# Patient Record
Sex: Female | Born: 1935 | Race: White | Hispanic: No | Smoking: Former smoker
Health system: Southern US, Community
[De-identification: ages and names within clinical notes are randomized; demographics above are authoritative.]

## PROBLEM LIST (undated history)

## (undated) DIAGNOSIS — M1990 Unspecified osteoarthritis, unspecified site: Secondary | ICD-10-CM

## (undated) DIAGNOSIS — K219 Gastro-esophageal reflux disease without esophagitis: Secondary | ICD-10-CM

## (undated) DIAGNOSIS — N189 Chronic kidney disease, unspecified: Secondary | ICD-10-CM

## (undated) DIAGNOSIS — I10 Essential (primary) hypertension: Secondary | ICD-10-CM

## (undated) DIAGNOSIS — R7301 Impaired fasting glucose: Secondary | ICD-10-CM

## (undated) DIAGNOSIS — I4891 Unspecified atrial fibrillation: Secondary | ICD-10-CM

## (undated) DIAGNOSIS — K8020 Calculus of gallbladder without cholecystitis without obstruction: Secondary | ICD-10-CM

## (undated) DIAGNOSIS — G8929 Other chronic pain: Secondary | ICD-10-CM

## (undated) DIAGNOSIS — I6529 Occlusion and stenosis of unspecified carotid artery: Secondary | ICD-10-CM

## (undated) DIAGNOSIS — M545 Low back pain: Secondary | ICD-10-CM

## (undated) DIAGNOSIS — E785 Hyperlipidemia, unspecified: Secondary | ICD-10-CM

## (undated) DIAGNOSIS — K851 Biliary acute pancreatitis without necrosis or infection: Secondary | ICD-10-CM

## (undated) HISTORY — DX: Calculus of gallbladder without cholecystitis without obstruction: K80.20

## (undated) HISTORY — DX: Occlusion and stenosis of unspecified carotid artery: I65.29

## (undated) HISTORY — DX: Impaired fasting glucose: R73.01

## (undated) HISTORY — DX: Hyperlipidemia, unspecified: E78.5

## (undated) HISTORY — PX: APPENDECTOMY: SHX54

## (undated) HISTORY — DX: Chronic kidney disease, unspecified: N18.9

## (undated) HISTORY — DX: Essential (primary) hypertension: I10

## (undated) HISTORY — PX: CHOLECYSTECTOMY: SHX55

## (undated) HISTORY — DX: Other chronic pain: G89.29

## (undated) HISTORY — DX: Gastro-esophageal reflux disease without esophagitis: K21.9

## (undated) HISTORY — DX: Unspecified atrial fibrillation: I48.91

## (undated) HISTORY — PX: CATARACT EXTRACTION: SUR2

## (undated) HISTORY — DX: Unspecified osteoarthritis, unspecified site: M19.90

## (undated) HISTORY — DX: Biliary acute pancreatitis without necrosis or infection: K85.10

## (undated) HISTORY — DX: Low back pain: M54.5

---

## 2008-06-30 ENCOUNTER — Ambulatory Visit: Payer: Self-pay | Admitting: Cardiology

## 2008-06-30 ENCOUNTER — Encounter: Payer: Self-pay | Admitting: Internal Medicine

## 2008-06-30 LAB — CONVERTED CEMR LAB
Chloride: 93 meq/L — ABNORMAL LOW (ref 96–112)
Creatinine, Ser: 1.81 mg/dL — ABNORMAL HIGH (ref 0.40–1.20)
Potassium: 4.7 meq/L (ref 3.5–5.3)

## 2008-07-07 ENCOUNTER — Ambulatory Visit: Payer: Self-pay | Admitting: Cardiology

## 2008-07-07 ENCOUNTER — Encounter: Payer: Self-pay | Admitting: Internal Medicine

## 2008-07-07 LAB — CONVERTED CEMR LAB
CO2: 22 meq/L (ref 19–32)
Glucose, Bld: 117 mg/dL — ABNORMAL HIGH (ref 70–99)
Potassium: 4.4 meq/L (ref 3.5–5.3)
Sodium: 137 meq/L (ref 135–145)

## 2008-07-14 ENCOUNTER — Ambulatory Visit: Payer: Self-pay | Admitting: Cardiology

## 2008-07-21 ENCOUNTER — Ambulatory Visit: Payer: Self-pay | Admitting: Cardiology

## 2008-08-06 ENCOUNTER — Ambulatory Visit: Payer: Self-pay | Admitting: Cardiology

## 2008-08-20 ENCOUNTER — Ambulatory Visit: Payer: Self-pay | Admitting: Cardiology

## 2008-09-10 ENCOUNTER — Ambulatory Visit: Payer: Self-pay | Admitting: Cardiovascular Disease

## 2008-10-06 ENCOUNTER — Ambulatory Visit: Payer: Self-pay | Admitting: Cardiology

## 2008-10-22 ENCOUNTER — Ambulatory Visit: Payer: Self-pay | Admitting: Cardiovascular Disease

## 2008-11-19 ENCOUNTER — Ambulatory Visit: Payer: Self-pay | Admitting: Cardiology

## 2008-12-17 ENCOUNTER — Ambulatory Visit: Payer: Self-pay | Admitting: Cardiology

## 2009-01-14 ENCOUNTER — Ambulatory Visit: Payer: Self-pay | Admitting: Cardiology

## 2009-02-11 ENCOUNTER — Ambulatory Visit: Payer: Self-pay | Admitting: Cardiology

## 2009-03-09 ENCOUNTER — Ambulatory Visit: Payer: Self-pay

## 2009-03-09 ENCOUNTER — Ambulatory Visit: Payer: Self-pay | Admitting: Cardiology

## 2009-04-06 ENCOUNTER — Ambulatory Visit: Payer: Self-pay | Admitting: Cardiology

## 2009-05-06 ENCOUNTER — Ambulatory Visit: Payer: Self-pay | Admitting: Cardiovascular Disease

## 2009-05-18 ENCOUNTER — Telehealth: Payer: Self-pay | Admitting: Cardiology

## 2009-05-27 ENCOUNTER — Encounter: Payer: Self-pay | Admitting: Cardiology

## 2009-06-01 ENCOUNTER — Ambulatory Visit: Payer: Self-pay | Admitting: Cardiology

## 2009-06-28 ENCOUNTER — Encounter: Payer: Self-pay | Admitting: *Deleted

## 2009-06-29 ENCOUNTER — Ambulatory Visit: Payer: Self-pay | Admitting: Cardiology

## 2009-06-29 LAB — CONVERTED CEMR LAB: Prothrombin Time: 21.2 s

## 2009-07-27 ENCOUNTER — Ambulatory Visit: Payer: Self-pay | Admitting: Cardiology

## 2009-07-27 LAB — CONVERTED CEMR LAB: POC INR: 2.6

## 2009-08-26 ENCOUNTER — Ambulatory Visit: Payer: Self-pay | Admitting: Cardiology

## 2009-08-26 DIAGNOSIS — I10 Essential (primary) hypertension: Secondary | ICD-10-CM

## 2009-08-26 DIAGNOSIS — R0989 Other specified symptoms and signs involving the circulatory and respiratory systems: Secondary | ICD-10-CM

## 2009-08-26 DIAGNOSIS — I4891 Unspecified atrial fibrillation: Secondary | ICD-10-CM

## 2009-08-26 DIAGNOSIS — I739 Peripheral vascular disease, unspecified: Secondary | ICD-10-CM

## 2009-08-26 LAB — CONVERTED CEMR LAB: POC INR: 2.7

## 2009-09-02 ENCOUNTER — Encounter: Payer: Self-pay | Admitting: Cardiology

## 2009-09-13 ENCOUNTER — Telehealth: Payer: Self-pay | Admitting: Cardiology

## 2009-09-23 ENCOUNTER — Ambulatory Visit: Payer: Self-pay | Admitting: Cardiology

## 2009-09-23 LAB — CONVERTED CEMR LAB: POC INR: 2.8

## 2009-10-27 ENCOUNTER — Ambulatory Visit: Payer: Self-pay | Admitting: Cardiology

## 2009-10-27 DIAGNOSIS — E785 Hyperlipidemia, unspecified: Secondary | ICD-10-CM

## 2009-11-25 ENCOUNTER — Ambulatory Visit: Payer: Self-pay | Admitting: Internal Medicine

## 2009-11-25 LAB — CONVERTED CEMR LAB: POC INR: 3.6

## 2009-12-09 ENCOUNTER — Ambulatory Visit: Payer: Self-pay | Admitting: Cardiology

## 2009-12-15 ENCOUNTER — Encounter: Payer: Self-pay | Admitting: Cardiology

## 2010-01-06 ENCOUNTER — Telehealth: Payer: Self-pay | Admitting: Cardiology

## 2010-01-06 ENCOUNTER — Ambulatory Visit: Payer: Self-pay | Admitting: Cardiovascular Disease

## 2010-02-03 ENCOUNTER — Ambulatory Visit: Payer: Self-pay | Admitting: Cardiovascular Disease

## 2010-03-10 ENCOUNTER — Encounter: Payer: Self-pay | Admitting: Cardiology

## 2010-03-14 ENCOUNTER — Encounter: Payer: Self-pay | Admitting: Cardiology

## 2010-03-15 ENCOUNTER — Ambulatory Visit: Payer: Self-pay | Admitting: Cardiovascular Disease

## 2010-03-15 ENCOUNTER — Ambulatory Visit: Payer: Self-pay

## 2010-03-15 ENCOUNTER — Encounter: Payer: Self-pay | Admitting: Cardiology

## 2010-03-15 LAB — CONVERTED CEMR LAB: POC INR: 2.2

## 2010-04-12 ENCOUNTER — Ambulatory Visit: Payer: Self-pay | Admitting: Cardiovascular Disease

## 2010-05-10 ENCOUNTER — Encounter: Payer: Self-pay | Admitting: Cardiology

## 2010-05-10 ENCOUNTER — Ambulatory Visit: Payer: Self-pay | Admitting: Cardiology

## 2010-05-10 DIAGNOSIS — R0602 Shortness of breath: Secondary | ICD-10-CM

## 2010-05-10 LAB — CONVERTED CEMR LAB: POC INR: 2.1

## 2010-05-17 ENCOUNTER — Encounter: Payer: Self-pay | Admitting: Cardiology

## 2010-05-17 ENCOUNTER — Ambulatory Visit: Payer: Self-pay

## 2010-05-17 LAB — CONVERTED CEMR LAB
ALT: 15 units/L (ref 0–35)
AST: 16 units/L (ref 0–37)
Albumin: 4.5 g/dL (ref 3.5–5.2)
Alkaline Phosphatase: 49 units/L (ref 39–117)
Glucose, Bld: 139 mg/dL — ABNORMAL HIGH (ref 70–99)
Potassium: 4.3 meq/L (ref 3.5–5.3)
Sodium: 136 meq/L (ref 135–145)
Total Protein: 6.6 g/dL (ref 6.0–8.3)

## 2010-05-23 ENCOUNTER — Telehealth: Payer: Self-pay | Admitting: Cardiovascular Disease

## 2010-06-07 ENCOUNTER — Ambulatory Visit: Payer: Self-pay | Admitting: Cardiovascular Disease

## 2010-06-07 LAB — CONVERTED CEMR LAB: POC INR: 2.3

## 2010-07-06 ENCOUNTER — Ambulatory Visit: Payer: Self-pay | Admitting: Cardiovascular Disease

## 2010-08-03 ENCOUNTER — Ambulatory Visit: Payer: Self-pay | Admitting: Cardiovascular Disease

## 2010-08-31 ENCOUNTER — Encounter: Payer: Self-pay | Admitting: Internal Medicine

## 2010-08-31 ENCOUNTER — Telehealth: Payer: Self-pay | Admitting: Cardiology

## 2010-08-31 ENCOUNTER — Ambulatory Visit: Payer: Self-pay | Admitting: Internal Medicine

## 2010-08-31 LAB — CONVERTED CEMR LAB
POC INR: 2.7
POC INR: 2.7

## 2010-09-16 ENCOUNTER — Encounter: Payer: Self-pay | Admitting: Cardiology

## 2010-09-28 ENCOUNTER — Ambulatory Visit: Payer: Self-pay | Admitting: Cardiovascular Disease

## 2010-09-28 LAB — CONVERTED CEMR LAB: POC INR: 2.9

## 2010-10-04 ENCOUNTER — Telehealth: Payer: Self-pay | Admitting: Cardiology

## 2010-10-24 ENCOUNTER — Encounter: Payer: Self-pay | Admitting: Cardiology

## 2010-10-25 ENCOUNTER — Ambulatory Visit: Payer: Self-pay | Admitting: Cardiovascular Disease

## 2010-10-25 ENCOUNTER — Ambulatory Visit: Payer: Self-pay

## 2010-10-25 ENCOUNTER — Encounter: Payer: Self-pay | Admitting: Cardiology

## 2010-11-16 ENCOUNTER — Ambulatory Visit
Admission: RE | Admit: 2010-11-16 | Discharge: 2010-11-16 | Payer: Self-pay | Source: Home / Self Care | Attending: Cardiology | Admitting: Cardiology

## 2010-11-16 ENCOUNTER — Encounter: Payer: Self-pay | Admitting: Cardiology

## 2010-11-16 ENCOUNTER — Ambulatory Visit: Admission: RE | Admit: 2010-11-16 | Discharge: 2010-11-16 | Payer: Self-pay | Source: Home / Self Care

## 2010-11-16 LAB — CONVERTED CEMR LAB: POC INR: 2.3

## 2010-12-13 NOTE — Medication Information (Signed)
Summary: CCR/AMD  Anticoagulant Therapy  Managed by: Cloyde Reams, RN, BSN Referring MD: Marca Ancona PCP: Gelene Mink, MD Supervising MD: Mariah Milling Indication 1: atrial fib (ICD-427.31) Lab Used: [facility] Site: [place] INR POC 2.3 INR RANGE 2.0-3.0  Dietary changes: no     Bleeding/hemorrhagic complications: no     Any changes in medication regimen? no     Any missed doses?: no       Is patient compliant with meds? yes       Allergies: No Known Drug Allergies  Anticoagulation Management History:      The patient is taking warfarin and comes in today for a routine follow up visit.  Positive risk factors for bleeding include an age of 75 years or older.  The bleeding index is 'intermediate risk'.  Positive CHADS2 values include History of HTN and Age > 57 years old.  The start date was 07/01/2008.  Anticoagulation responsible provider: Gollan.  INR POC: 2.3.  Cuvette Lot#: [id number].  Exp: 06/2011.    Anticoagulation Management Assessment/Plan:      The patient's current anticoagulation dose is Warfarin sodium 5 mg tabs: Use as directed by Anticoagulation Clinic.  The target INR is 2 - 3.  The next INR is due 05/10/2010.  Anticoagulation instructions were given to patient.  Results were reviewed/authorized by Cloyde Reams, RN, BSN.  She was notified by Cloyde Reams RN.         Prior Anticoagulation Instructions: INR 2.2  Continue on same dosage 2.5mg  daily except 5mg  on Mondays and Fridays.  Recheck in 4 weeks.    Current Anticoagulation Instructions: INR 2.3  Continue on same dosage 2.5mg  daily except 5mg  on Mondays and Fridays.  Recheck in 4 weeks.

## 2010-12-13 NOTE — Medication Information (Signed)
Summary: rov/ewj  Anticoagulant Therapy  Managed by: Cloyde Reams, RN, BSN Referring MD: Marca Ancona PCP: Dr. Gerhard Munch MD: Mariah Milling Indication 1: atrial fib (ICD-427.31) Lab Used: [facility] Site: [place] INR POC 2.5 INR RANGE 2.0-3.0  Dietary changes: no    Health status changes: yes       Details: Had cateract surgery since last visit.    Bleeding/hemorrhagic complications: no    Recent/future hospitalizations: no    Any changes in medication regimen? no    Recent/future dental: no  Any missed doses?: no       Is patient compliant with meds? yes       Allergies: No Known Drug Allergies  Anticoagulation Management History:      The patient is taking warfarin and comes in today for a routine follow up visit.  Positive risk factors for bleeding include an age of 75 years or older.  The bleeding index is 'intermediate risk'.  Positive CHADS2 values include History of HTN and Age > 75 years old.  The start date was 07/01/2008.  Anticoagulation responsible provider: gollan.  INR POC: 2.5.  Cuvette Lot#: [id number].  Exp: 08/2011.    Anticoagulation Management Assessment/Plan:      The patient's current anticoagulation dose is Warfarin sodium 5 mg tabs: Use as directed by Anticoagulation Clinic.  The target INR is 2 - 3.  The next INR is due 08/31/2010.  Anticoagulation instructions were given to patient.  Results were reviewed/authorized by Cloyde Reams, RN, BSN.  She was notified by Cloyde Reams RN.         Prior Anticoagulation Instructions: INR 2.7  Continue on same dosage 1/2 tablet daily except 1 tablet on Mondays and Fridays.  Recheck in 4 weeks.    Current Anticoagulation Instructions: INR 2.5  Continue on same dosage 1/2 tablet daily except 1 tablet on Mondays and Fridays.  Recheck in 4 weeks.

## 2010-12-13 NOTE — Medication Information (Signed)
Summary: CCR/AMD   Anticoagulant Therapy  Managed by: Robyn Haber, RN, BSN Referring MD: Marca Ancona PCP: Gelene Mink, MD Supervising MD: Gala Romney MD, Reuel Boom Indication 1: atrial fib (ICD-427.31) Lab Used: [facility] Site: [place] INR POC 2.4 INR RANGE 2.0-3.0  Dietary changes: no    Health status changes: no    Bleeding/hemorrhagic complications: no    Recent/future hospitalizations: no    Any changes in medication regimen? no    Recent/future dental: no  Any missed doses?: no       Is patient compliant with meds? yes       Allergies: No Known Drug Allergies  Anticoagulation Management History:      The patient is taking warfarin and comes in today for a routine follow up visit.  Positive risk factors for bleeding include an age of 75 years or older.  The bleeding index is 'intermediate risk'.  Positive CHADS2 values include History of HTN and Age > 75 years old.  The start date was 07/01/2008.  Anticoagulation responsible provider: Cherylynn Liszewski MD, Reuel Boom.  INR POC: 2.4.    Anticoagulation Management Assessment/Plan:      The patient's current anticoagulation dose is Warfarin sodium 5 mg tabs: Use as directed by Anticoagulation Clinic.  The target INR is 2 - 3.  The next INR is due 01/06/2010.  Anticoagulation instructions were given to patient.  Results were reviewed/authorized by Robyn Haber, RN, BSN.  She was notified by Charlena Cross, RN, BSN.         Prior Anticoagulation Instructions: none today then resume coumadin 2.5 mg daily with 5 mg on Monday and Friday  Current Anticoagulation Instructions: The patient is to continue with the same dose of coumadin.  This dosage includes: coumadin 2.5 mg daily with 5 mg on monday and friday

## 2010-12-13 NOTE — Medication Information (Signed)
Summary: [name] [name]  Anticoagulant Therapy  Managed by: Bethena Midget, RN, BSN Referring MD: Marca Ancona PCP: Dr. Gerhard Munch MD: Mariah Milling  Indication 1: atrial fib (ICD-427.31) Lab Used: [facility] Site: [place] INR POC 2.9 INR RANGE 2.0-3.0  Dietary changes: no    Health status changes: yes       Details: Has had both eye cataract removal since last visit  Bleeding/hemorrhagic complications: no    Recent/future hospitalizations: no    Any changes in medication regimen? no    Recent/future dental: no  Any missed doses?: no       Is patient compliant with meds? yes       Allergies: No Known Drug Allergies  Anticoagulation Management History:      The patient is taking warfarin and comes in today for a routine follow up visit.  Positive risk factors for bleeding include an age of 75 years or older.  The bleeding index is 'intermediate risk'.  Positive CHADS2 values include History of HTN and Age > 4 years old.  The start date was 07/01/2008.  Anticoagulation responsible Nyair Depaulo: Gollan .  INR POC: 2.9.  Cuvette Lot#: [id number].  Exp: 10/2011.    Anticoagulation Management Assessment/Plan:      The patient's current anticoagulation dose is Warfarin sodium 5 mg tabs: Use as directed by Anticoagulation Clinic.  The target INR is 2 - 3.  The next INR is due 10/25/2010.  Anticoagulation instructions were given to patient.  Results were reviewed/authorized by Bethena Midget, RN, BSN.  She was notified by Bethena Midget, RN, BSN.         Prior Anticoagulation Instructions: INR 2.7  Continue on same dosage 1/2 tablet daily except 1 tablet on Mondays and Fridays.  Recheck in 4 weeks.    Current Anticoagulation Instructions: INR 2.9 Continue 2.5mg s daily except 5mg s on Mondays and Fridays. Recheck in 4 weeks.

## 2010-12-13 NOTE — Progress Notes (Signed)
Summary: RX   Phone Note Refill Request Call back at Home Phone [phone] Message from:  Patient on October 04, 2010 10:25 AM  Refills Requested: Medication #1:  AMLODIPINE BESYLATE 5 MG TABS Take one tablet by mouth daily. [facility] [address]  Initial call taken by: Harlon Flor,  October 04, 2010 10:25 AM  Follow-up for Phone Call        Rx faxed to pharmacy Follow-up by: Bishop Dublin, CMA,  October 04, 2010 11:29 AM    Prescriptions: AMLODIPINE BESYLATE 5 MG TABS (AMLODIPINE BESYLATE) Take one tablet by mouth daily  #30 x 4   Entered by:   Bishop Dublin, CMA   Authorized by:   Marca Ancona, MD   Signed by:   Bishop Dublin, CMA on 10/04/2010   Method used:   [facility]ically to        [facility] [phone]* (retail)       [address] [place], [place]  [address]       Ph: [phone] or [phone]       Fax: [phone]   RxID:   [id number]

## 2010-12-13 NOTE — Progress Notes (Signed)
Summary: Results   Phone Note Call from Patient Call back at Home Phone [phone]   Caller: Patient Call For: gollan Summary of Call: Patient called RN back to get results of echocardiogram. Initial call taken by: [place],  May 23, 2010 3:27 PM  Follow-up for Phone Call        pt aware of results. Follow-up by: Benedict Needy, RN,  May 23, 2010 4:25 PM

## 2010-12-13 NOTE — Progress Notes (Signed)
Summary: RX   Phone Note Refill Request Call back at Home Phone [phone] Message from:  Patient on August 31, 2010 11:23 AM  Refills Requested: Medication #1:  LOSARTAN POTASSIUM 100 MG TABS 1 by mouth daily  Medication #2:  CARVEDILOL [facility] [address]  Initial call taken by: Harlon Flor,  August 31, 2010 11:23 AM    Prescriptions: COREG 25 MG TABS (CARVEDILOL) 1 tab by mouth twice daily  #60 x 6   Entered by:   Bishop Dublin, CMA   Authorized by:   Marca Ancona, MD   Signed by:   Bishop Dublin, CMA on 08/31/2010   Method used:   Electronically to        [facility] [phone]* (retail)       [address] [place], [place]  [address]       Ph: [phone] or [phone]       Fax: [phone]   RxID:   [id number] LOSARTAN POTASSIUM 100 MG TABS (LOSARTAN POTASSIUM) 1 by mouth daily  #30 x 6   Entered by:   Bishop Dublin, CMA   Authorized by:   Marca Ancona, MD   Signed by:   Bishop Dublin, CMA on 08/31/2010   Method used:   [facility]ically to        [facility] [phone]* (retail)       [address] [place], [place]  [address]       Ph: [phone] or [phone]       Fax: [phone]   RxID:   [id number]

## 2010-12-13 NOTE — Medication Information (Signed)
Summary: CCR/AMD      Allergies Added: NKDA Anticoagulant Therapy  Managed by: Robyn Haber, RN, BSN Referring MD: Marca Ancona PCP: Gelene Mink, MD Supervising MD: Mariah Milling Indication 1: atrial fib (ICD-427.31) Lab Used: [facility] Site: [place] INR POC 2.8 INR RANGE 2.0-3.0  Dietary changes: no    Health status changes: no    Bleeding/hemorrhagic complications: no    Recent/future hospitalizations: no    Any changes in medication regimen? no    Recent/future dental: no  Any missed doses?: no       Is patient compliant with meds? yes       Current Medications (verified): 1)  Aspirin 81 Mg Tbec (Aspirin) .... Take One Tablet By Mouth Daily 2)  Warfarin Sodium 5 Mg Tabs (Warfarin Sodium) .... Use As Directed By Anticoagulation Clinic 3)  Clonazepam 0.5 Mg Tabs (Clonazepam) .Marland Kitchen.. 1 By Mouth Daily Prn 4)  Losartan Potassium 100 Mg Tabs (Losartan Potassium) .Marland Kitchen.. 1 By Mouth Daily 5)  Chlorthalidone 25 Mg Tabs (Chlorthalidone) .... Take One Tablet By Mouth Daily 6)  Pravastatin Sodium 40 Mg Tabs (Pravastatin Sodium) .... Take One Tablet By Mouth Daily At Bedtime 7)  Coq-10 150 Mg Caps (Coenzyme Q10) .... Take 1 By Mouth Once Daily 8)  Coreg 25 Mg Tabs (Carvedilol) .Marland Kitchen.. 1 Tab By Mouth Twice Daily  Allergies (verified): No Known Drug Allergies  Anticoagulation Management History:      The patient is taking warfarin and comes in today for a routine follow up visit.  Positive risk factors for bleeding include an age of 75 years or older.  The bleeding index is 'intermediate risk'.  Positive CHADS2 values include History of HTN and Age > 64 years old.  The start date was 07/01/2008.  Anticoagulation responsible provider: gollan.  INR POC: 2.8.    Anticoagulation Management Assessment/Plan:      The patient's current anticoagulation dose is Warfarin sodium 5 mg tabs: Use as directed by Anticoagulation Clinic.  The target INR is 2 -  3.  The next INR is due 03/03/2010.  Anticoagulation instructions were given to patient.  Results were reviewed/authorized by Robyn Haber, RN, BSN.  She was notified by Mercer Pod.         Prior Anticoagulation Instructions: The patient is to continue with the same dose of coumadin.  This dosage includes: coumadin 2.5 mg daily with 5 mg on Monday and Friday  Current Anticoagulation Instructions: The patient is to continue with the same dose of coumadin.  This dosage includes: 2.5mg  everyday except 5mg  on Mon and Fri.Marland Kitchen

## 2010-12-13 NOTE — Miscellaneous (Signed)
Summary: Orders Update  Clinical Lists Changes  Orders: Added new Test order of Carotid Duplex (Carotid Duplex) - Signed 

## 2010-12-13 NOTE — Medication Information (Signed)
Summary: CCR/NE  Anticoagulant Therapy  Managed by: Cloyde Reams, RN, BSN Referring MD: Marca Ancona PCP: Dr. Gerhard Munch MD: Mariah Milling Indication 1: atrial fib (ICD-427.31) Lab Used: [facility] Site: [place] INR POC 2.7 INR RANGE 2.0-3.0  Dietary changes: no    Health status changes: no    Bleeding/hemorrhagic complications: no    Recent/future hospitalizations: no    Any changes in medication regimen? no    Recent/future dental: no  Any missed doses?: no       Is patient compliant with meds? yes       Allergies: No Known Drug Allergies  Anticoagulation Management History:      The patient is taking warfarin and comes in today for a routine follow up visit.  Positive risk factors for bleeding include an age of 75 years or older.  The bleeding index is 'intermediate risk'.  Positive CHADS2 values include History of HTN and Age > 75 years old.  The start date was 07/01/2008.  Anticoagulation responsible provider: gollan.  INR POC: 2.7.  Cuvette Lot#: [id number].  Exp: 08/2011.    Anticoagulation Management Assessment/Plan:      The patient's current anticoagulation dose is Warfarin sodium 5 mg tabs: Use as directed by Anticoagulation Clinic.  The target INR is 2 - 3.  The next INR is due 08/03/2010.  Anticoagulation instructions were given to patient.  Results were reviewed/authorized by Cloyde Reams, RN, BSN.  She was notified by Cloyde Reams RN.         Prior Anticoagulation Instructions: INR 2.3  Continue taking 1/2 tab daily except for 1 tab on Monday and Friday. Recheck in 4 weeks.   Current Anticoagulation Instructions: INR 2.7  Continue on same dosage 1/2 tablet daily except 1 tablet on Mondays and Fridays.  Recheck in 4 weeks.

## 2010-12-13 NOTE — Medication Information (Signed)
Summary: CCR   Anticoagulant Therapy  Managed by: Robyn Haber, RN, BSN Referring MD: Marca Ancona PCP: Gelene Mink, MD Supervising MD: Mariah Milling Indication 1: atrial fib (ICD-427.31) Lab Used: [facility] Site: [place] INR POC 2.6 INR RANGE 2.0-3.0  Dietary changes: no    Health status changes: no    Bleeding/hemorrhagic complications: no    Recent/future hospitalizations: no    Any changes in medication regimen? no    Recent/future dental: no  Any missed doses?: no       Is patient compliant with meds? yes       Allergies: No Known Drug Allergies  Anticoagulation Management History:      The patient is taking warfarin and comes in today for a routine follow up visit.  Positive risk factors for bleeding include an age of 75 years or older.  The bleeding index is 'intermediate risk'.  Positive CHADS2 values include History of HTN and Age > 75 years old.  The start date was 07/01/2008.  Anticoagulation responsible provider: gollan.  INR POC: 2.6.    Anticoagulation Management Assessment/Plan:      The patient's current anticoagulation dose is Warfarin sodium 5 mg tabs: Use as directed by Anticoagulation Clinic.  The target INR is 2 - 3.  The next INR is due 02/03/2010.  Anticoagulation instructions were given to patient.  Results were reviewed/authorized by Robyn Haber, RN, BSN.  She was notified by Charlena Cross, RN, BSN.         Prior Anticoagulation Instructions: The patient is to continue with the same dose of coumadin.  This dosage includes: coumadin 2.5 mg daily with 5 mg on monday and friday  Current Anticoagulation Instructions: The patient is to continue with the same dose of coumadin.  This dosage includes: coumadin 2.5 mg daily with 5 mg on Monday and Friday

## 2010-12-13 NOTE — Progress Notes (Signed)
Summary: RX   Phone Note Refill Request Call back at Home Phone [phone] Message from:  SELF on January 06, 2010 10:40 AM  Refills Requested: Medication #1:  WARFARIN SODIUM 5 MG TABS Use as directed by Anticoagulation Clinic [facility] [address]  Initial call taken by: Harlon Flor,  January 06, 2010 10:40 AM    Prescriptions: WARFARIN SODIUM 5 MG TABS (WARFARIN SODIUM) Use as directed by Anticoagulation Clinic  #35 x 3   Entered by:   Mercer Pod   Authorized by:   Marca Ancona, MD   Signed by:   Mercer Pod on 01/06/2010   Method used:   [facility] to        [facility] [phone]* (retail)       [address] [place], [place]  [address]       Ph: [phone] or [phone]       Fax: [phone]   RxID:   [id number]

## 2010-12-13 NOTE — Medication Information (Signed)
Summary: ccr   Anticoagulant Therapy  Managed by: Cloyde Reams, RN, BSN Referring MD: Marca Ancona PCP: Dr. Gerhard Munch MD: Shirlee Latch MD, Freida Busman Indication 1: atrial fib (ICD-427.31) Lab Used: [facility] Site: [facility] INR POC 2.3 INR RANGE 2.0-3.0  Dietary changes: no     Bleeding/hemorrhagic complications: no     Any changes in medication regimen? no     Any missed doses?: no       Is patient compliant with meds? yes      3  Allergies: No Known Drug Allergies  Anticoagulation Management History:      The patient is taking warfarin and comes in today for a routine follow up visit.  Positive risk factors for bleeding include an age of 75 years or older.  The bleeding index is 'intermediate risk'.  Positive CHADS2 values include History of HTN and Age > 43 years old.  The start date was 07/01/2008.  Anticoagulation responsible provider: Shirlee Latch MD, [place].  INR POC: 2.3.  Cuvette Lot#: [id number].  Exp: 06/2011.    Anticoagulation Management Assessment/Plan:      The patient's current anticoagulation dose is Warfarin sodium 5 mg tabs: Use as directed by Anticoagulation Clinic.  The target INR is 2 - 3.  The next INR is due 07/06/2010.  Anticoagulation instructions were given to patient.  Results were reviewed/authorized by Cloyde Reams, RN, BSN.  She was notified by Benedict Needy, RN.         Prior Anticoagulation Instructions: INR 2.1  Continue on same dosage 2.5mg  daily except 5mg  on Mondays and Fridays.  Recheck in 4 weeks.    Current Anticoagulation Instructions: INR 2.3  Continue taking 1/2 tab daily except for 1 tab on Monday and Friday. Recheck in 4 weeks.

## 2010-12-13 NOTE — Medication Information (Signed)
Summary: CCR/AMD  Anticoagulant Therapy  Managed by: Cloyde Reams, RN, BSN Referring MD: Marca Ancona PCP: Gelene Mink, MD Supervising MD: Mariah Milling Indication 1: atrial fib (ICD-427.31) Lab Used: [facility] Site: [place] INR POC 2.2 INR RANGE 2.0-3.0    Bleeding/hemorrhagic complications: no     Any changes in medication regimen? no     Any missed doses?: no       Is patient compliant with meds? yes       Allergies: No Known Drug Allergies  Anticoagulation Management History:      The patient is taking warfarin and comes in today for a routine follow up visit.  Positive risk factors for bleeding include an age of 75 years or older.  The bleeding index is 'intermediate risk'.  Positive CHADS2 values include History of HTN and Age > 68 years old.  The start date was 07/01/2008.  Anticoagulation responsible provider: Koby Hartfield.  INR POC: 2.2.  Cuvette Lot#: [id number].  Exp: 03/2011.    Anticoagulation Management Assessment/Plan:      The patient's current anticoagulation dose is Warfarin sodium 5 mg tabs: Use as directed by Anticoagulation Clinic.  The target INR is 2 - 3.  The next INR is due 04/12/2010.  Anticoagulation instructions were given to patient.  Results were reviewed/authorized by Cloyde Reams, RN, BSN.  She was notified by Cloyde Reams RN.         Prior Anticoagulation Instructions: The patient is to continue with the same dose of coumadin.  This dosage includes: 2.5mg  everyday except 5mg  on Mon and Fri..  Current Anticoagulation Instructions: INR 2.2  Continue on same dosage 2.5mg  daily except 5mg  on Mondays and Fridays.  Recheck in 4 weeks.

## 2010-12-13 NOTE — Medication Information (Signed)
Summary: CCR/AMD   Anticoagulant Therapy  Managed by: Robyn Haber, RN, BSN Referring MD: Marca Ancona PCP: Gelene Mink, MD Supervising MD: Graciela Husbands MD, Viviann Spare Indication 1: atrial fib (ICD-427.31) Lab Used: [facility] Site: [place] INR POC 3.6 INR RANGE 2.0-3.0  Dietary changes: no    Health status changes: no    Bleeding/hemorrhagic complications: no    Recent/future hospitalizations: no    Any changes in medication regimen? no    Recent/future dental: no  Any missed doses?: no       Is patient compliant with meds? yes       Allergies: No Known Drug Allergies  Anticoagulation Management History:      The patient is taking warfarin and comes in today for a routine follow up visit.  Positive risk factors for bleeding include an age of 72 years or older.  The bleeding index is 'intermediate risk'.  Positive CHADS2 values include History of HTN and Age > 82 years old.  The start date was 07/01/2008.  Anticoagulation responsible provider: Graciela Husbands MD, Viviann Spare.  INR POC: 3.6.    Anticoagulation Management Assessment/Plan:      The patient's current anticoagulation dose is Warfarin sodium 5 mg tabs: Use as directed by Anticoagulation Clinic.  The target INR is 2 - 3.  The next INR is due 12/09/2009.  Anticoagulation instructions were given to patient.  Results were reviewed/authorized by Robyn Haber, RN, BSN.  She was notified by Charlena Cross, RN, BSN.         Prior Anticoagulation Instructions: The patient is to continue with the same dose of coumadin.  This dosage includes: coumadin 2.5 mg daily with 5 mg on Mon and Fri  Current Anticoagulation Instructions: none today then resume coumadin 2.5 mg daily with 5 mg on Monday and Friday

## 2010-12-13 NOTE — Medication Information (Signed)
Summary: [facility]  Anticoagulant Therapy  Managed by: Cloyde Reams, RN, BSN Referring MD: Marca Ancona PCP: Gelene Mink, MD Supervising MD: Shirlee Latch MD, Kayode Petion Indication 1: atrial fib (ICD-427.31) Lab Used: [facility]--[place]  Site: [place] INR POC 2.1 INR RANGE 2.0-3.0    Bleeding/hemorrhagic complications: no     Any changes in medication regimen? no     Any missed doses?: no       Is patient compliant with meds? yes       Allergies: No Known Drug Allergies  Anticoagulation Management History:      The patient is taking warfarin and comes in today for a routine follow up visit.  Positive risk factors for bleeding include an age of 75 years or older.  The bleeding index is 'intermediate risk'.  Positive CHADS2 values include History of HTN and Age > 75 years old.  The start date was 07/01/2008.  Anticoagulation responsible provider: Shirlee Latch MD, Melany Wiesman.  INR POC: 2.1.  Cuvette Lot#: [id number].  Exp: 07/2011.    Anticoagulation Management Assessment/Plan:      The patient's current anticoagulation dose is Warfarin sodium 5 mg tabs: Use as directed by Anticoagulation Clinic.  The target INR is 2 - 3.  The next INR is due 06/07/2010.  Anticoagulation instructions were given to patient.  Results were reviewed/authorized by Cloyde Reams, RN, BSN.  She was notified by Cloyde Reams RN.         Prior Anticoagulation Instructions: INR 2.3  Continue on same dosage 2.5mg  daily except 5mg  on Mondays and Fridays.  Recheck in 4 weeks.    Current Anticoagulation Instructions: INR 2.1  Continue on same dosage 2.5mg  daily except 5mg  on Mondays and Fridays.  Recheck in 4 weeks.

## 2010-12-13 NOTE — Medication Information (Signed)
Summary: rov/ewj  Anticoagulant Therapy  Managed by: Cloyde Reams, RN, BSN Referring MD: Marca Ancona PCP: Dr. Gerhard Munch MD: Gala Romney MD, Reuel Boom Indication 1: atrial fib (ICD-427.31) Lab Used: [facility] Site: [place] INR POC 2.7 INR RANGE 2.0-3.0  Dietary changes: no    Health status changes: no    Bleeding/hemorrhagic complications: no    Recent/future hospitalizations: no    Any changes in medication regimen? no    Recent/future dental: no  Any missed doses?: no       Is patient compliant with meds? yes       Allergies: No Known Drug Allergies  Anticoagulation Management History:      The patient is taking warfarin and comes in today for a routine follow up visit.  Positive risk factors for bleeding include an age of 93 years or older.  The bleeding index is 'intermediate risk'.  Positive CHADS2 values include History of HTN and Age > 56 years old.  The start date was 07/01/2008.  Anticoagulation responsible Eupha Lobb: Bensimhon MD, Reuel Boom.  INR POC: 2.7.  Cuvette Lot#: [id number].  Exp: 09/2011.    Anticoagulation Management Assessment/Plan:      The patient's current anticoagulation dose is Warfarin sodium 5 mg tabs: Use as directed by Anticoagulation Clinic.  The target INR is 2 - 3.  The next INR is due 09/28/2010.  Anticoagulation instructions were given to patient.  Results were reviewed/authorized by Cloyde Reams, RN, BSN.  She was notified by Cloyde Reams RN.         Prior Anticoagulation Instructions: INR 2.5  Continue on same dosage 1/2 tablet daily except 1 tablet on Mondays and Fridays.  Recheck in 4 weeks.   Current Anticoagulation Instructions: INR 2.7  Continue on same dosage 1/2 tablet daily except 1 tablet on Mondays and Fridays.  Recheck in 4 weeks.

## 2010-12-13 NOTE — Medication Information (Signed)
Summary: [facility] Clinic  Anticoagulant Therapy  Managed by: Cloyde Reams, RN, BSN Referring MD: Marca Ancona PCP: Dr. Gerhard Munch MD: Gala Romney MD, Reuel Boom Indication 1: atrial fib (ICD-427.31) Lab Used: [facility] Site: [place] INR POC 2.7 INR RANGE 2.0-3.0           Allergies: No Known Drug Allergies  Anticoagulation Management History:      The patient is taking warfarin and comes in today for a routine follow up visit.  Positive risk factors for bleeding include an age of 31 years or older.  The bleeding index is 'intermediate risk'.  Positive CHADS2 values include History of HTN and Age > 60 years old.  The start date was 07/01/2008.  Anticoagulation responsible Jordin Vicencio: Bensimhon MD, Reuel Boom.  INR POC: 2.7.  Exp: 08/2011.    Anticoagulation Management Assessment/Plan:      The patient's current anticoagulation dose is Warfarin sodium 5 mg tabs: Use as directed by Anticoagulation Clinic.  The target INR is 2 - 3.  The next INR is due 09/28/2010.  Anticoagulation instructions were given to patient.  Results were reviewed/authorized by Cloyde Reams, RN, BSN.  She was notified by Cloyde Reams RN.         Prior Anticoagulation Instructions: INR 2.5  Continue on same dosage 1/2 tablet daily except 1 tablet on Mondays and Fridays.  Recheck in 4 weeks.   Current Anticoagulation Instructions: INR 2.7  Continue on same dosage 1/2 tablet daily except 1 tablet on Mondays and Fridays.  Recheck in 4 weeks.

## 2010-12-13 NOTE — Assessment & Plan Note (Signed)
Summary: F6M/CCR/AMD  Medications Added AMLODIPINE BESYLATE 5 MG TABS (AMLODIPINE BESYLATE) Take one tablet by mouth daily      Allergies Added: NKDA  Visit Type:  Follow-up Primary Provider:  Dr. Lacie Scotts  CC:  No cardiac complaints..  History of Present Illness: 77 yo with history of atrial fibrillation, HTN, carotid stenosis, and smoking presents for followup.  Her BP continues to run quite high despite taking all her meds.  It is 165/76 here and systolic BP has been in the 140s for the most part at home.   She is still in atrial fibrillation with good heart rate control.  She is taking coumadin.  She does continue to smoke but is down to a pack a week.  She has no exertional chest pain or stroke-like symptoms.  She is still playing a lot of golf. She is mildly short of breath walking up a hill but has no trouble on flat ground or with steps.  Carotid dopplers in 5/11 showed moderate bilateral stenosis.  She is tolerating pravastatin without any muscle pain.   Labs (7/10): TSH normal, creatinine 1.47, LDL 76, HDL 52 Labs (10/10): K 4.5, creatinine 1.18, LDL 90, HDL 51 Labs (5/11): LDL 80, HDL 57, NT-proBNP 1610  Current Medications (verified): 1)  Aspirin 81 Mg Tbec (Aspirin) .... Take One Tablet By Mouth Daily 2)  Warfarin Sodium 5 Mg Tabs (Warfarin Sodium) .... Use As Directed By Anticoagulation Clinic 3)  Losartan Potassium 100 Mg Tabs (Losartan Potassium) .Marland Kitchen.. 1 By Mouth Daily 4)  Chlorthalidone 25 Mg Tabs (Chlorthalidone) .... Take One Tablet By Mouth Daily 5)  Pravastatin Sodium 40 Mg Tabs (Pravastatin Sodium) .... Take One Tablet By Mouth Daily At Bedtime 6)  Coreg 25 Mg Tabs (Carvedilol) .Marland Kitchen.. 1 Tab By Mouth Twice Daily  Allergies (verified): No Known Drug Allergies  Past History:  Family History: Last updated: 08/26/2009 The patient has had several sisters and brothers with atrial fibrillation.  She also has a niece who has atrial fibrillation.  Her father had  myocardial infarction at age of 21 and her sister had congestive heart failure in the 24s.  She has also had several sisters who had strokes.   Social History: Last updated: 08/26/2009  The patient lives in [place] with her husband.  She is retired.  She plays a lot of golf.  She helps take care of several grandchildren.  She smokes half-a-pack a day and has done so for years. She drinks minimal alcohol and uses no illicit drugs.      Past Medical History: 1. Osteoarthritis. 2. Hypertension. 3. Smoking.  4. Gastroesophageal reflux disease. 5. Hyperlipidemia.  Myalgias with Lipitor, Crestor, Zocor and did not tolerate Niaspan.  She does tolerate pravastatin.  6. Cholelithiasis. 7. Chronic kidney disease.  8. Impaired fasting glucose. 9. Atrial fibrillation.  This seems to have become persistent.  HR is controlled.  10. Carotid stenosis. Carotid US (5/11): 60-79% bilateral ICA stenosis.   She does have bilateral carotid bruits. 11. Echocardiogram.  This is done in August 2009, showed LVH with normal LV function, EF 60%.  Sclerotic aortic valve without significant stenosis and mild mitral annular calcification.  There is mitral regurgitation present with a dilated left atrium.  I do not see in the report the degree of LVH or the degree of mitral regurgitation.   Family History: Reviewed history from 08/26/2009 and no changes required. The patient has had several sisters and brothers with atrial fibrillation.  She also has a niece  who has atrial fibrillation.  Her father had myocardial infarction at age of 42 and her sister had congestive heart failure in the 49s.  She has also had several sisters who had strokes.   Social History: Reviewed history from 08/26/2009 and no changes required.  The patient lives in [place] with her husband.  She is retired.  She plays a lot of golf.  She helps take care of several grandchildren.  She smokes half-a-pack a day and has done so for years. She  drinks minimal alcohol and uses no illicit drugs.      Review of Systems       All systems reviewed and negative except as per HPI.   Vital Signs:  Patient profile:   77 year old female Height:      67 inches Weight:      160 pounds Pulse rate:   88 / minute BP sitting:   165 / 76  (left arm) Cuff size:   regular  Vitals Entered By: Bishop Dublin, CMA (May 10, 2010 11:35 AM)  Physical Exam  General:  Well developed, well nourished, in no acute distress. Neck:  Neck supple, no JVD. No masses, thyromegaly or abnormal cervical nodes. Lungs:  Clear bilaterally to auscultation and percussion. Heart:  Non-displaced PMI, chest non-tender; irregular rate and rhythm, S1, S2 with 2/6 early systolic murmur at RUSB.  Bilateral carotid bruits. 1+ PT pulse on the right, trace PT pulse on the left.  No edema, no varicosities. Abdomen:  Bowel sounds positive; abdomen soft and non-tender without masses, organomegaly, or hernias noted. No hepatosplenomegaly. Extremities:  No clubbing or cyanosis. Neurologic:  Alert and oriented x 3. Psych:  Normal affect.   Impression & Recommendations:  Problem # 1:  ATRIAL FIBRILLATION (ICD-427.31) Atrial fibrillation seems to have become persistent.  Rate is well-controlled and she is on coumadin.  As she does not seem to be symptomatic, I think that continuing a rate control strategy would be reasonable.   Problem # 2:  HYPERLIPIDEMIA-MIXED (ICD-272.4) Patient has been watching her diet more closely.  Will await next lipid profile and if LDL is > 70, would plan on increasing pravastatin to 80 mg daily.  Goal LDL < 70 because of known vascular disease (carotid stenosis).   Problem # 3:  CAROTID BRUIT (ICD-785.9) Moderate bilateral carotid disease.  Followup carotid dopplers in 11/11.   Problem # 4:  HYPERTENSION, UNSPECIFIED (ICD-401.9) Assessment: Unchanged BP is still too high, start amlodipine 5 mg daily.  BP check in 2 wks.   Problem # 5:   SHORTNESS OF BREATH (ICD-786.05) Patient has good exercise tolerance in general but gets some mild dyspnea when walking up a hill.  She looks euvolemic on exam.  Interestingly, she had an NT-proBNP at her PCP's office recently that was elevated to 1733.  I will have her get an echocardiogram to evaluate LV function.  I will get a BNP today.   Other Orders: Echocardiogram (Echo) Carotid Duplex (Carotid Duplex) T-BNP  (B Natriuretic Peptide) ([id number]) T-Comprehensive Metabolic Panel ([id number])  Patient Instructions: 1)  Your physician recommends that you have lab work today (CMP, BNP) 2)  Your physician has recommended you make the following change in your medication: START AMOLODIPINE 5MG  DAILY 3)  Your physician wants you to follow-up in: 6 MONTHS   You will receive a reminder letter in the mail two months in advance. If you don't receive a letter, please call our office to schedule the follow-up appointment. 4)  Your physician has requested that you have a carotid duplex. This test is an ultrasound of the carotid arteries in your neck. It looks at blood flow through these arteries that supply the brain with blood. Allow one hour for this exam. There are no restrictions or special instructions. NOVEMBER 2011 5)  Your physician has requested that you have an echocardiogram.  Echocardiography is a painless test that uses sound waves to create images of your heart. It provides your doctor with information about the size and shape of your heart and how well your heart's chambers and valves are working.  This procedure takes approximately one hour. There are no restrictions for this procedure. ASAP 6)  Your physician has requested that you regularly monitor and record your blood pressure readings at home.  Please use the same machine at the same time of day to check your readings and call us with the results in 2 weeks.  Prescriptions: AMLODIPINE BESYLATE 5 MG TABS (AMLODIPINE BESYLATE) Take one  tablet by mouth daily  #30 x 4   Entered by:   Benedict Needy, RN   Authorized by:   Marca Ancona, MD   Signed by:   Benedict Needy, RN on 05/10/2010   Method used:   Electronically to        [facility] [facility] [employer] [phone]* (retail)       [address] [place], [place]  [address]       Ph: [phone] or [phone]       Fax: [phone]   RxID:   [id number]

## 2010-12-15 NOTE — Assessment & Plan Note (Signed)
Summary: F/U 6 MONTHS      Allergies Added: NKDA  Visit Type:  Follow-up Primary Provider:  Dr. Lacie Scotts  CC:  [name] Carpen. has irreg. heart beats.  Denies chest pain or shortness of breath..  History of Present Illness: 77 yo with history of atrial fibrillation, HTN, carotid stenosis, and smoking presents for followup.  BP looks ok today and has been reasonably well-controlled at recent checks at other doctors' offices.  Weight is up 8 lbs since last appointment (she says she may have overindulged over the holidays).  She continues to be in atrial fibrillation with good rate control.  She is tolerating pravastatin with very mild myalgias but does not appear to have increased it to 80 mg daily as I had asked her at last appointment.  She denies exertional shortness of breath or chest pain.  Still playing golf though not as often recently due to low back pain.  She does not notice being in atrial fibrillation.  Echo in 7/11 showed preserved EF with mild mitral regurgitaiton and mild pulmonary hypertension (PA systolic pressure 42 mmHg).  Carotids repeated in 12/11 were stable.    Labs (7/10): TSH normal, creatinine 1.47, LDL 76, HDL 52 Labs (10/10): K 4.5, creatinine 1.18, LDL 90, HDL 51 Labs (5/11): LDL 80, HDL 57, NT-proBNP 4132 Labs (6/11): K 4.3, creatinine 1.44, BNP 242  ECG: atrial fibrillation at 83, otherwise normal  Current Medications (verified): 1)  Aspirin 81 Mg Tbec (Aspirin) .... Take One Tablet By Mouth Daily 2)  Warfarin Sodium 5 Mg Tabs (Warfarin Sodium) .... Use As Directed By Anticoagulation Clinic 3)  Losartan Potassium 100 Mg Tabs (Losartan Potassium) .Marland Kitchen.. 1 By Mouth Daily 4)  Chlorthalidone 25 Mg Tabs (Chlorthalidone) .... Take One Tablet By Mouth Daily 5)  Pravastatin Sodium 40 Mg Tabs (Pravastatin Sodium) .... Take One Tablet By Mouth Daily At Bedtime 6)  Coreg 25 Mg Tabs (Carvedilol) .Marland Kitchen.. 1 Tab By Mouth Twice Daily 7)  Amlodipine Besylate 5 Mg Tabs (Amlodipine Besylate)  .... Take One Tablet By Mouth Daily  Allergies (verified): No Known Drug Allergies  Past History:  Family History: Last updated: 08/26/2009 The patient has had several sisters and brothers with atrial fibrillation.  She also has a niece who has atrial fibrillation.  Her father had myocardial infarction at age of 29 and her sister had congestive heart failure in the 54s.  She has also had several sisters who had strokes.   Social History: Last updated: 08/26/2009  The patient lives in [place] with her husband.  She is retired.  She plays a lot of golf.  She helps take care of several grandchildren.  She smokes half-a-pack a day and has done so for years. She drinks minimal alcohol and uses no illicit drugs.      Past Medical History: 1. Osteoarthritis. 2. Hypertension. 3. Smoking.  4. Gastroesophageal reflux disease. 5. Hyperlipidemia.  Myalgias with Lipitor, Crestor, Zocor and did not tolerate Niaspan.  She does tolerate pravastatin.  6. Cholelithiasis. 7. Chronic kidney disease.  8. Impaired fasting glucose. 9. Atrial fibrillation.  This seems to have become persistent.  HR is controlled. Echo (7/11): EF 55-65%, no regional wall motion abnormalities, mild MR, normal RV size and systolic function, PA systolic pressure 42 mmHg.  10. Carotid stenosis. Carotid US (5/11): 60-79% bilateral ICA stenosis.   Carotid US (12/11) with stable 60-79% bilateral carotid stenosis and left subclavian stenosis without steal.  She does have bilateral carotid bruits. 11. Chronic low back pain.  Family History: Reviewed history from 08/26/2009 and no changes required. The patient has had several sisters and brothers with atrial fibrillation.  She also has a niece who has atrial fibrillation.  Her father had myocardial infarction at age of 44 and her sister had congestive heart failure in the 71s.  She has also had several sisters who had strokes.   Social History: Reviewed history from 08/26/2009  and no changes required.  The patient lives in [place] with her husband.  She is retired.  She plays a lot of golf.  She helps take care of several grandchildren.  She smokes half-a-pack a day and has done so for years. She drinks minimal alcohol and uses no illicit drugs.      Review of Systems       All systems reviewed and negative except as per HPI.   Vital Signs:  Patient profile:   77 year old female Height:      67 inches Weight:      168 pounds BMI:     26.41 Pulse rate:   83 / minute BP sitting:   138 / 78  (left arm) Cuff size:   regular  Vitals Entered By: Bishop Dublin, CMA (November 16, 2010 9:51 AM)  Physical Exam  General:  Well developed, well nourished, in no acute distress. Neck:  Neck supple, no JVD. No masses, thyromegaly or abnormal cervical nodes. Lungs:  Clear bilaterally to auscultation and percussion. Heart:  Non-displaced PMI, chest non-tender; irregular rate and rhythm, S1, S2 with 2/6 early systolic murmur at RUSB.  Bilateral carotid bruits. 1+ PT pulse on the right, trace PT pulse on the left.  No edema, no varicosities. Abdomen:  Bowel sounds positive; abdomen soft and non-tender without masses, organomegaly, or hernias noted. No hepatosplenomegaly. Extremities:  No clubbing or cyanosis. Neurologic:  Alert and oriented x 3. Psych:  Normal affect.   Impression & Recommendations:  Problem # 1:  ATRIAL FIBRILLATION (ICD-427.31) This appears to be persistent.  She denies any significant exertional symptoms.  Rate is controlled on current dose of Coreg and she is taking coumadin.  I will continue her on these meds for now.  Given minimal symptoms, I do not suggest that we try to cardiovert her at this point.   Problem # 2:  HYPERLIPIDEMIA-MIXED (ICD-272.4) Goal LDL < 70 given vascular disease.  She is tolerating pravastatin.  Coenzyme Q10 helps but she does not always take it.  Will get most recent lipids from Dr. Lacie Scotts and encourage her to take the  coenzyme Q10.   Problem # 3:  PVD (ICD-443.9) Patient's history of foot pain, decreased pedal pulses, and smoking as well as known carotid vascular disease worry me that she has PAD in her legs.  I set her up for arterial dopplers but she did not get them done.  I will continue to aggressively treat risk factors.  She has no foot lesions.    Problem # 4:  CAROTID BRUIT (ICD-785.9) Moderate bilateral carotid disease.  Followup carotid dopplers in 6/12.   Problem # 5:  ESSENTIAL HYPERTENSION, BENIGN (ICD-401.1) BP under good control. Continue meds.   Problem # 6:  SMOKING I again advised her to quit.   Problem # 7:  DIASTOLIC CHF Based on mildly elevated BNP, patient appears to have a degree of diastolic CHF in the setting of atrial fibrillation and HTN.  She is minimally symptomatic and BP is controlled.    Other Orders: EKG w/ Interpretation (  93000) Carotid Duplex (Carotid Duplex)  Patient Instructions: 1)  Your physician recommends that you schedule a follow-up appointment in: 6 months. 2)  Your physician has requested that you have a carotid duplex. This test is an ultrasound of the carotid arteries in your neck. It looks at blood flow through these arteries that supply the brain with blood. Allow one hour for this exam. There are no restrictions or special instructions. June 2012

## 2010-12-15 NOTE — Medication Information (Signed)
Summary: Coumadin Clinic   Anticoagulant Therapy  Managed by: Cloyde Reams, RN, BSN Referring MD: Marca Ancona PCP: Dr. Gerhard Munch MD: Mariah Milling  Indication 1: atrial fib (ICD-427.31) Lab Used: [facility] Site: [place] INR POC 2.6 INR RANGE 2.0-3.0  Dietary changes: no    Health status changes: no    Bleeding/hemorrhagic complications: no    Recent/future hospitalizations: no    Any changes in medication regimen? no    Recent/future dental: no  Any missed doses?: no       Is patient compliant with meds? yes       Allergies: No Known Drug Allergies  Anticoagulation Management History:      The patient is taking warfarin and comes in today for a routine follow up visit.  Positive risk factors for bleeding include an age of 75 years or older.  The bleeding index is 'intermediate risk'.  Positive CHADS2 values include History of HTN and Age > 2 years old.  The start date was 07/01/2008.  Anticoagulation responsible provider: [place] .  INR POC: 2.6.  Exp: 10/2011.    Anticoagulation Management Assessment/Plan:      The patient's current anticoagulation dose is Warfarin sodium 5 mg tabs: Use as directed by Anticoagulation Clinic.  The target INR is 2 - 3.  The next INR is due 11/16/2010.  Anticoagulation instructions were given to patient.  Results were reviewed/authorized by Cloyde Reams, RN, BSN.  She was notified by Lanny Hurst RN.         Prior Anticoagulation Instructions: INR 2.9 Continue 2.5mg s daily except 5mg s on Mondays and Fridays. Recheck in 4 weeks.   Current Anticoagulation Instructions: INR 2.6  Continue on same dosage 1/2 tablet daily except 1 tablet on Mondays and Fridays.  Recheck in 4 weeks.

## 2010-12-15 NOTE — Miscellaneous (Signed)
Summary: Orders Update  Clinical Lists Changes  Orders: Added new Test order of Carotid Duplex (Carotid Duplex) - Signed 

## 2010-12-15 NOTE — Medication Information (Signed)
Summary: ccr/ewj  Anticoagulant Therapy  Managed by: Cloyde Reams, RN, BSN Referring MD: Marca Ancona PCP: Dr. Gerhard Munch MD: Shirlee Latch MD, Freida Busman Indication 1: atrial fib (ICD-427.31) Lab Used: [facility] Site: [place] INR POC 2.3 INR RANGE 2.0-3.0  Dietary changes: no    Health status changes: no    Bleeding/hemorrhagic complications: no    Recent/future hospitalizations: no    Any changes in medication regimen? no    Recent/future dental: no  Any missed doses?: no       Is patient compliant with meds? yes       Allergies: No Known Drug Allergies  Anticoagulation Management History:      The patient is taking warfarin and comes in today for a routine follow up visit.  Positive risk factors for bleeding include an age of 75 years or older.  The bleeding index is 'intermediate risk'.  Positive CHADS2 values include History of HTN and Age > 75 years old.  The start date was 07/01/2008.  Anticoagulation responsible provider: Shirlee Latch MD, Sigmond Patalano.  INR POC: 2.3.  Cuvette Lot#: [id number].  Exp: 12/2011.    Anticoagulation Management Assessment/Plan:      The patient's current anticoagulation dose is Warfarin sodium 5 mg tabs: Use as directed by Anticoagulation Clinic.  The target INR is 2 - 3.  The next INR is due 12/21/2010.  Anticoagulation instructions were given to patient.  Results were reviewed/authorized by Cloyde Reams, RN, BSN.  She was notified by Cloyde Reams RN.         Prior Anticoagulation Instructions: INR 2.6  Continue on same dosage 1/2 tablet daily except 1 tablet on Mondays and Fridays.  Recheck in 4 weeks.    Current Anticoagulation Instructions: INR 2.3  Continue on same dosage 1/2 tablet daily except 1 tablet on Mondays and Fridays.  Recheck in 4 weeks.

## 2010-12-16 ENCOUNTER — Encounter: Payer: Self-pay | Admitting: Cardiology

## 2010-12-21 ENCOUNTER — Encounter (INDEPENDENT_AMBULATORY_CARE_PROVIDER_SITE_OTHER): Payer: MEDICARE

## 2010-12-21 ENCOUNTER — Encounter: Payer: Self-pay | Admitting: Cardiovascular Disease

## 2010-12-21 DIAGNOSIS — Z7901 Long term (current) use of anticoagulants: Secondary | ICD-10-CM

## 2010-12-21 DIAGNOSIS — I4891 Unspecified atrial fibrillation: Secondary | ICD-10-CM

## 2010-12-29 NOTE — Medication Information (Signed)
Summary: [facility]  Anticoagulant Therapy  Managed by: Cloyde Reams, RN, BSN Referring MD: Marca Ancona PCP: Dr. Gerhard Munch MD: Mariah Milling Indication 1: atrial fib (ICD-427.31) Lab Used: [facility] Site: [place] INR POC 3.0 INR RANGE 2.0-3.0  Dietary changes: yes       Details: Eating less vit K vegetables  Health status changes: no    Bleeding/hemorrhagic complications: no    Recent/future hospitalizations: no    Any changes in medication regimen? no    Recent/future dental: no  Any missed doses?: no       Is patient compliant with meds? yes       Allergies: No Known Drug Allergies  Anticoagulation Management History:      The patient is taking warfarin and comes in today for a routine follow up visit.  Positive risk factors for bleeding include an age of 75 years or older.  The bleeding index is 'intermediate risk'.  Positive CHADS2 values include History of HTN and Age > 75 years old.  The start date was 07/01/2008.  Anticoagulation responsible provider: Reis Goga.  INR POC: 3.0.  Cuvette Lot#: [id number].  Exp: 12/2011.    Anticoagulation Management Assessment/Plan:      The patient's current anticoagulation dose is Warfarin sodium 5 mg tabs: Use as directed by Anticoagulation Clinic.  The target INR is 2 - 3.  The next INR is due 01/18/2011.  Anticoagulation instructions were given to patient.  Results were reviewed/authorized by Cloyde Reams, RN, BSN.  She was notified by Cloyde Reams RN.         Prior Anticoagulation Instructions: INR 2.3  Continue on same dosage 1/2 tablet daily except 1 tablet on Mondays and Fridays.  Recheck in 4 weeks.    Current Anticoagulation Instructions: INR 3.0  Continue on same dosage 1/2 tablet daily except 1 tablet on Mondays and Fridays.  Recheck in 4 weeks.

## 2011-01-18 ENCOUNTER — Encounter (INDEPENDENT_AMBULATORY_CARE_PROVIDER_SITE_OTHER): Payer: Medicare Other

## 2011-01-18 ENCOUNTER — Encounter: Payer: Self-pay | Admitting: Internal Medicine

## 2011-01-18 DIAGNOSIS — I4891 Unspecified atrial fibrillation: Secondary | ICD-10-CM

## 2011-01-18 DIAGNOSIS — Z7901 Long term (current) use of anticoagulants: Secondary | ICD-10-CM

## 2011-01-24 NOTE — Medication Information (Signed)
Summary: rov/ewj  Anticoagulant Therapy  Managed by: Bethena Midget, RN, BSN Referring MD: Marca Ancona PCP: Dr. Gerhard Munch MD: Gala Romney MD, Reuel Boom Indication 1: atrial fib (ICD-427.31) Lab Used: [facility] Site: [place] INR POC 2.6 INR RANGE 2.0-3.0  Dietary changes: no    Health status changes: no    Bleeding/hemorrhagic complications: no    Recent/future hospitalizations: no    Any changes in medication regimen? no    Recent/future dental: no  Any missed doses?: no       Is patient compliant with meds? yes       Allergies: No Known Drug Allergies  Anticoagulation Management History:      The patient is taking warfarin and comes in today for a routine follow up visit.  Positive risk factors for bleeding include an age of 75 years or older.  The bleeding index is 'intermediate risk'.  Positive CHADS2 values include History of HTN and Age > 75 years old.  The start date was 07/01/2008.  Anticoagulation responsible provider: Maida Widger MD, Reuel Boom.  INR POC: 2.6.  Cuvette Lot#: [id number].  Exp: 11/2011.    Anticoagulation Management Assessment/Plan:      The patient's current anticoagulation dose is Warfarin sodium 5 mg tabs: Use as directed by Anticoagulation Clinic.  The target INR is 2 - 3.  The next INR is due 02/15/2011.  Anticoagulation instructions were given to patient.  Results were reviewed/authorized by Bethena Midget, RN, BSN.  She was notified by Bethena Midget, RN, BSN.         Prior Anticoagulation Instructions: INR 3.0  Continue on same dosage 1/2 tablet daily except 1 tablet on Mondays and Fridays.  Recheck in 4 weeks.   Current Anticoagulation Instructions: INR 2.6 Continue 2.5mg s daily except 5mg s on Mondays and Fridays. Recheck in 4 weeks.

## 2011-01-31 ENCOUNTER — Encounter: Payer: Self-pay | Admitting: Cardiology

## 2011-01-31 DIAGNOSIS — Z7901 Long term (current) use of anticoagulants: Secondary | ICD-10-CM | POA: Insufficient documentation

## 2011-01-31 DIAGNOSIS — I4891 Unspecified atrial fibrillation: Secondary | ICD-10-CM

## 2011-02-15 ENCOUNTER — Ambulatory Visit (INDEPENDENT_AMBULATORY_CARE_PROVIDER_SITE_OTHER): Payer: Medicare Other | Admitting: Emergency Medicine

## 2011-02-15 DIAGNOSIS — I4891 Unspecified atrial fibrillation: Secondary | ICD-10-CM

## 2011-02-15 DIAGNOSIS — Z7901 Long term (current) use of anticoagulants: Secondary | ICD-10-CM

## 2011-02-15 NOTE — Patient Instructions (Signed)
Continue on same dosage 1/2 tablet daily except 1 tablet on Mondays and Fridays.  Recheck in 4 weeks. 

## 2011-03-15 ENCOUNTER — Ambulatory Visit (INDEPENDENT_AMBULATORY_CARE_PROVIDER_SITE_OTHER): Payer: Medicare Other | Admitting: Emergency Medicine

## 2011-03-15 DIAGNOSIS — Z7901 Long term (current) use of anticoagulants: Secondary | ICD-10-CM

## 2011-03-15 DIAGNOSIS — I4891 Unspecified atrial fibrillation: Secondary | ICD-10-CM

## 2011-03-15 LAB — POCT INR: INR: 3.1

## 2011-03-17 ENCOUNTER — Encounter: Payer: Self-pay | Admitting: Emergency Medicine

## 2011-03-28 NOTE — Assessment & Plan Note (Signed)
[name] [name] OFFICE NOTE   NAME:[name], [name]                   MRN:          [id number]  DATE:06/30/2008                            DOB:          05/05/1928    PRIMARY CARE PHYSICIAN:  [name] Daft. Timoteo Gaul, MD,  Family Practice.   HISTORY OF PRESENT ILLNESS:  This is a 77 year old with a history of  hypertension, smoking, and hypercholesterolemia who presents to  Cardiology Clinic for evaluation of newly noted atrial fibrillation.  The patient states that she has been in her usual state of health.  However, when she came for her annual physical with Dr. Timoteo [name], she had  an EKG done, which showed her to be in atrial fibrillation with a rate  over 100 beats per minute.  She at that time was actually asymptomatic.  She was not feeling any palpitations or racing heart beat.  She did not  have chest pain or shortness of breath.  She has had no prior documented  episodes of atrial fibrillation.  However, it is interesting that she  has had several sisters and brothers as well as a niece who have all had  atrial fibrillation.  Today, she is actually in sinus rhythm again with  no particular intervention.  Also, yesterday the patient had carotid  ultrasound done.  This I believe was done because of bilateral carotid  bruits.  It showed evidence of greater than 70% stenosis bilaterally in  the carotid arteries.  At baseline, the patient is very active.  She  golf 2-3 times a week.  She has never had an episode of chest pain.  She  walks up and down the hills on the [place], walks for exercise, and  does not have dyspnea on exertion.  She can climb steps without any  trouble, does housework without any problems.  Denies orthopnea or PND.  In general, her exercise tolerance appears to be fairly good for a 56-  year-old.  She also reports no episodes of syncope or presyncope and she  denies prior history of stroke  or TIA.  She has had no stroke-like  symptoms such as facial droop or arm or leg weakness.  She denies any  history of claudication.   PAST MEDICAL HISTORY:  1. Osteoarthritis.  2. Hypertension.  3. Smoker.  She smokes about half-a-pack a day.  4. Gastroesophageal reflux disease.  5. Hypercholesterolemia.  6. Cholelithiasis.  7. Chronic kidney disease.  The patient's last creatinine was 1.5.      This may be due to hypertensive nephropathy.  8. Impaired fasting glucose.  Glucose was 113 when last checked on      fasting labs.  9. Paroxysmal atrial fibrillation.  This was first noted on June 29, 2008, when the patient was at her PCP's office for an annual      physical.  10.Carotid artery stenosis.  The patient has apparently been known to      have carotid bruits and apparently mild carotid stenosis on prior      carotid ultrasounds.  However,  carotid ultrasound done on June 29, 2008, showed greater than 70% bilateral carotid artery      stenosis.  11.Echocardiogram done on June 29, 2008, showed LVH with normal LV      function.  EF of 60%.  The aortic valve was sclerotic with no      significant stenosis.  There is mitral annular calcification.      There is mitral regurgitation and there is dilated left atrium.  I      do not know the degree of LVH, the degree of mitral regurgitation,      or the degree of left atrial dilation as this was a partial report      faxed over and not the official echo report, which we will need to      obtain.   EKG done on June 29, 2008, shows atrial fibrillation at rate of 99, is  otherwise normal EKG.  EKG done today June 30, 2008, is in normal  sinus rhythm, normal EKG.  Most recent labs that were done in August  2009 showed normal TSH, hematocrit 39.5, creatinine 1.5, BUN 24, glucose  113, total cholesterol 212, LDL 135, HDL 61, and triglycerides 80.   MEDICATIONS:  1. Aspirin 81 mg daily.  2. Plavix 75 mg daily.  3.  Verapamil ER 240 mg daily.  4. Irbesartan/hydrochlorothiazide 300/25 daily.   SOCIAL HISTORY:  The patient lives in [place] with her husband.  She  is retired.  She plays a lot of golf.  She helps take care of several  grandchildren.  She smokes half-a-pack a day and has done so for years.  She drinks minimal alcohol and uses no illicit drugs.   FAMILY HISTORY:  The patient has had several sisters and brothers with  atrial fibrillation.  She also has a niece who has atrial fibrillation.  Her father had myocardial infarction at age of 6 and her sister had  congestive heart failure in the 72s.  She has also had several sisters  who has strokes.   REVIEW OF SYSTEMS:  Negative, except that is noted in the history of  present illness.   PHYSICAL EXAMINATION:  VITAL SIGNS:  Blood pressure today 151/67, heart  rate 81, weight is 162, and heart rate is regular.  GENERAL:  No apparent distress.  She is a well-appearing elderly female.  HEENT:  Normal exam.  NECK:  No jugular venous distention.  Normal carotid upstrokes.  No  thyromegaly or thyroid nodule.  There are bilateral carotid bruits.  LUNGS:  Clear to auscultation bilaterally.  Normal respiratory effort.  CARDIOVASCULAR:  Regular S1 and S2.  No S3, no S4.  There is a 2/6  crescendo-decrescendo systolic murmur at the right upper sternal border.  S2 is well preserved.  ABDOMEN:  Soft, nontender.  No hepatosplenomegaly.  Bowel sounds are  normal.  EXTREMITIES:  There is no peripheral edema.  No clubbing or cyanosis.  NEUROLOGIC:  Alert and oriented x3.  Normal affect.  SKIN:  Normal exam.  MUSCULOSKELETAL:  Normal exam.   ASSESSMENT AND PLAN:  This is a 77 year old with a history of  hypertension, hypercholesterolemia, paroxysmal atrial fibrillation, and  carotid artery disease, who presents to Cardiology Clinic for  evaluation.  1. Atrial fibrillation.  The patient now has documented paroxysmal      atrial fibrillation.  She  did not have really any symptoms while      she was in atrial fibrillation  and her rate was reasonably well      controlled.  Therefore, I am unsure of how often she has been in      atrial fibrillation in the past.  She is currently in sinus rhythm.      She did have an echocardiogram done yesterday, which we will      attempt to attain the faxed official report for.  She is at      reasonably high risk of stroke given her age and her hypertension,      therefore, I think it would be reasonable to start her on Coumadin.      We will plan on starting her on Coumadin probably tomorrow.  We are      going to stop her Plavix, will have her stop taking it today.  She      has been on Plavix in the past due to known carotid artery      stenosis.  She can continue her baby aspirin at 81 mg daily for      stroke prevention purposes.  Also of note, if she does go for      carotid endarterectomy, she will need to stop the Coumadin prior to      the endarterectomy, but she can continue on her aspirin.  2. Carotid artery disease.  Per report, the patient had bilateral      greater than 70% stenosis on her carotid ultrasound.  We will need      to obtain the faxed official report of this study as well.  She has      had no stroke-like symptoms, has no history of prior stroke.  She      does have bilateral carotid bruits.  Given the significant degree      of stenosis, I think it would be reasonable to refer her to see a      vascular surgeon.  I think, in addition we will also obtain a      followup imaging to make sure that the carotids are indeed      significantly stenosed.  We will obtain an MRA of the carotid      arteries to assure that the arteries are moderately to fairly      stenosed.  We will do this rather than a CTA given the patient's      baseline chronic kidney disease with a creatinine of 1.5 when last      checked.  Additionally, we will schedule the patient to see the      vascular  surgeons.  She does state that she would like to go to      [place] to have this done, so we will schedule her with our      vascular surgeons in [place].  3. Hypertension.  The patient's blood pressure is elevated to 151/67      today.  We will stop her verapamil and start her on Toprol-XL at      100 mg daily.  In preparation for a possible surgery, we will have      her come back in 1 week to have her blood pressure checked.  If it      is still greater than 140/90, we will start her on Norvasc 5 mg      daily.  4. Coronary artery disease risk.  The patient has a number of risk      factors for coronary artery disease.  However,  she has excellent      exercise tolerance and is not complaining of any chest pain or      shortness of breath with exertion.  If she does indeed go for      carotid endarterectomy, I do not think that she needs further      cardiac testing given her excellent exercise tolerance.  I think      she needs to be on beta-blocker, which we will start with Toprol-XL      and she also needs to continue on aspirin, and we will start a      statin.  She does have no known history of coronary artery disease.  5. Hypercholesterolemia.  The patient's LDL is 135 in the setting of      peripheral arterial disease.  We should aim for LDL at least less      than 100 and perfectly less than 70.  I am going to go ahead and      start her on simvastatin 20 mg daily.  She states she has had some      trouble tolerating Lipitor in the past.  Therefore, we will start      low dose simvastatin and see how she tolerates it.  6. Smoking.  I talked to the patient extensively about smoking      cessation.  She does not want      to continue patches or other medications at this time.  She said      she will try to stop on her own.     Marca Ancona, MD  Electronically Signed    DM/MedQ  DD: 06/30/2008  DT: 07/01/2008  Job #: [id number]   cc:   [name] Daft. Timoteo Gaul, MD  Rollene Rotunda, MD, [facility]

## 2011-03-28 NOTE — Assessment & Plan Note (Signed)
[name] OFFICE NOTE   NAME:Freid, Khali                   MRN:          [id number]  DATE:02/11/2009                            DOB:          03/31/1928    PRIMARY CARE PHYSICIAN:  Dr. Gelene Mink at [facility],  [place], [place].   HISTORY OF PRESENT ILLNESS:  This is a 77 year old with a history of  hypertension, smoking, hyperlipidemia, carotid stenosis, and atrial  fibrillation who returns to Cardiology Clinic for management of the  aforementioned problems.  She states she actually feels fairly well  today; however, her blood pressure is running quite high at 180/80.  She  is taking all of her medications.  Unfortunately, she was unable to  tolerate either Norvasc or Procardia XL because of leg swelling.  Therefore, she remains on irbesartan/HCTZ and Toprol XL for her blood  pressure control.  She had been on simvastatin for hyperlipidemia;  however, she had muscle pain with simvastatin and had to stop it.  She  has also had muscle pain with multiple other statins including Lipitor  and Crestor.  We did try her on Niaspan and with Niaspan she could not  tolerate the flushing that she got with that medication, so she stopped  that also.  The patient is noted on EKG and on exam to be back in atrial  fibrillation.  Her heart rate is under reasonable control with a rate in  the 70s.  She does continue to take her Coumadin.  She also was noted to  have carotid stenosis of 70-79% bilaterally.  She has had no stroke-like  symptoms.  She has been doing fairly well symptomatically lately.  She  does play golf.  She walks up and down hills on the golf course with no  shortness of breath on exertion.  She cannot think of anything that  tires her and makes her particularly short of breath.  She has not had  any chest pain.  She has not noticed that she is back in atrial  fibrillation as she  has no palpitations.  She does continue to smoke a  half pack a day.  She wants to stop, but she does not want to use any  medications to stop.   PAST MEDICAL HISTORY:  1. Osteoarthritis.  2. Hypertension.  3. Smoking.  The patient smokes about half a pack a day.  4. Gastroesophageal reflux disease.  5. Hyperlipidemia.  6. Cholelithiasis.  7. Chronic kidney disease.  The patient's most recent creatinine was      1.48 in August 2009.  8. Impaired fasting glucose.  9. Paroxysmal atrial fibrillation.  This was first noted in August      2009.  Today, she is actually back in atrial fibrillation.  10.Carotid stenosis.  Carotid ultrasound done on August 2009 shows      right internal carotid artery stenosis of 70% to 79%.  Left      internal carotid artery stenosis of 70% to 79%.  She does have      bilateral carotid bruits.  11.Echocardiogram.  This is done in August 2009, showed LVH with      normal LV function.  She has 60% sclerotic aortic valve without      significant stenosis and mild mitral annular calcification.  There      is mitral regurgitation present with a dilated left atrium.  I do      not see in the report the degree of LVH or the degree of mitral      regurgitation.   MEDICATIONS:  1. Aspirin 81 mg a day.  2. Warfarin.  3. Irbesartan/HCTZ 300/25 mg daily.  4. Toprol-XL 150 mg daily.   SOCIAL HISTORY:  The patient lives in [place] with her husband.  She  is retired.  She plays a lot of golf.  She takes care of her several  grandchildren.  She smokes half a pack a day and has done so for several  years.  She drinks minimal alcohol.  Does not use any illicit drugs.   FAMILY HISTORY:  There is no family history of premature coronary artery  disease.  There is a family history of atrial fibrillation.  Her father  did have myocardial infarction at age of 44, and her sister had  congestive heart failure in the 36s.  She has several sisters with  strokes.    LABORATORY DATA:  Most recent labs from August 2009, potassium 4.4,  creatinine 1.48.   EKG was reviewed today shows atrial fibrillation at a rate of 79, is  otherwise normal EKG.   REVIEW OF SYSTEMS:  All systems were reviewed and are negative except as  noted in the history of present illness.   PHYSICAL EXAMINATION:  VITAL SIGNS:  Blood pressure is 180/80, heart  rate 79 and regular.  GENERAL:  This is a well-developed female in no apparent distress.  NEUROLOGIC:  Alert and oriented x3.  Normal affect.  LUNGS:  Clear to auscultation bilaterally with normal respiratory  effort.  CARDIOVASCULAR:  Heart, irregular S1 and S2.  No S3 or S4.  There is a  2/6 crescendo-decrescendo or early systolic murmur at the right upper  sternal border.  There is 1+ ankle edema.  There are 2+ dorsalis pedis  pulses bilaterally.  There are bilateral carotid bruits.  ABDOMEN:  Soft, nontender.  No hepatosplenomegaly.  Normal bowel sounds.  EXTREMITIES:  No clubbing or cyanosis.  SKIN:  Normal exam.  MUSCULOSKELETAL:  Normal exam.  HEENT:  Normal.  NECK:  There is no JVD.  There is no thyromegaly or thyroid nodule.   ASSESSMENT AND PLAN:  This is a 77 year old with a history of paroxysmal  atrial fibrillation, hyperlipidemia, hypertension, and carotid artery  disease.  She returns to Cardiology Clinic for evaluation of her medical  problems.  1. Atrial fibrillation.  The patient is back in atrial fibrillation      today.  She does have documented paroxysmal atrial fibrillation.      She actually does not symptomatically notice the atrial      fibrillation.  She does not know she is in AFib today.  She does      not get short of breath with exertion.  Her echo in 2009 showed      preserved LV systolic function.  She is on Coumadin and we will      continue this for anticoagulation.  We will also continue her on      her beta-blocker for rate control.  I think rather than attempting  rhythm  control, we will just continue her on a rate control      strategy.  She does appear to be in and out of atrial fibrillation,      and as she is really not symptomatic with it, I do not think there      is any indication to put her on an antiarrhythmic drug at this      point.  2. Carotid artery disease.  The patient has bilateral 70 to 79%      internal carotid artery stenosis.  I did offer her to have her      visit a vascular surgeon; however, she is very reluctant to do      this.  I did tell her that she has an increased risk of stroke.  We      will do a followup carotid ultrasound to see if there has been      progression of disease.  If there has been progression of disease,      I will again attempt to refer her to see a vascular surgeon.  3. Hypertension.  The patient's blood pressure is quite high today.      It is 180/80, she has not been able to tolerate Norvasc or      Procardia XL.  I am not sure how compliant she has been with the      irbesartan/HCTZ either.  She says it is very expensive medicine for      her.  She has had difficulty obtaining that medication, so we are      going to stop the irbesartan/HCTZ.  I am going to put her on      losartan 100 mg a day and chlorthalidone 25 mg a day.  I will also      increase her Toprol-XL to 200 mg a day.  She will come back in      about a week.  We will get a blood pressure check, CHEM-7, and we      will also check her lipids.  4. Hyperlipidemia.  The patient's LDL cholesterol was elevated at 135      when it was last checked.  Given her peripheral arterial disease,      she should have an LDL at least less than 100 and preferably less      than 70.  She is not able to tolerate simvastatin.  There have been      multiple other statins that she has tried in the past and has not      been able to tolerate.  Pravastatin has a low rate of side effects      compared to other statins.  She has never tried this one.  I am       going to put her on pravastatin 20 mg daily.  I will also start her      on CoQ10 to see if she will be able to tolerate this medicine      without muscle aching.  5. I did talk to the patient extensively about smoking cessation.  She      is going to try to do this.  She does not want any medications to      try to help her with the smoking cessation.     Marca Ancona, MD  Electronically Signed    DM/MedQ  DD: 02/11/2009  DT: 02/12/2009  Job #: [id number]   cc:   Jennette Kettle  [place]

## 2011-03-28 NOTE — Assessment & Plan Note (Signed)
[name] [name] OFFICE NOTE   NAME:[name], [name]                   MRN:          [id number]  DATE:08/06/2008                            DOB:          Mar 25, 1928    PRIMARY CARE PHYSICIAN:  [name] Daft. Timoteo Gaul, MD, [facility].   HISTORY OF PRESENT ILLNESS:  This is a 77 year old with a history of  hypertension, smoking, hypercholesterolemia who was recently seen in  Cardiology Clinic for evaluation of atrial fibrillation.  When she was  last seen, she had gone back in normal sinus rhythm and she remains in  normal sinus rhythm today.  Last time, we did stop her Plavix and  started her on Coumadin, given her CHADS score of at least 2.  We also  discussed with the patient her 70-90% bilateral carotid stenosis at last  appointment.  We had talk about having carotid endarterectomy done.  She  was somewhat reticent to be evaluated for this.  She has been doing  quite well since last time I saw her.  She has had no episodes of  syncope or presyncope, and she denies any history of stroke-like  symptoms, no facial droop or arm or leg weakness.  She denies any  history of claudication.  She is quite active.  She plays golf and walks  for exercise.  She has not had dyspnea on exertion.  She does not get  chest pain with exertion.   PAST MEDICAL HISTORY:  1. Osteoarthritis.  2. Hypertension.  3. Smoker.  She smokes about half-a-pack a day.  4. Gastroesophageal reflux disease.  5. Hypercholesterolemia.  6. Cholelithiasis.  7. Chronic kidney disease.  The patient's last creatinine was about      1.5, prior to that it was 1.8.  This is likely due to hypertensive      nephropathy.  8. Impaired fasting glucose.  9. Paroxysmal atrial fibrillation.  This was first noted in August      2009, at her primary care physician's office.  She has remained in      sinus rhythm today.  10.Carotid artery stenosis.  Carotid  ultrasound done in August 2009,      shows right internal carotid artery stenosis of 70-79% and left      internal carotid artery stenosis of 70-79%.  She does have      bilateral carotid bruits.  We did initially set her up to do an MRA      of her carotid arteries, however, given her creatinine was 1.8 at      that time, the test was cancelled.  11.Echocardiogram done in August 2009, shows LVH with normal LV      function, EF 60%, sclerotic aortic valve with no significant      stenosis, and mitral annular calcification.  There was mitral      regurgitation present.  There was dilated left atrium.  I did not      know the degree of LVH or the degree of mitral regurgitation, as we      only have a partial report.  MEDICATIONS:  1. Aspirin 81 mg daily.  2. Coumadin.  3. Toprol-XL 100 mg daily.  4. Zocor 20 mg daily.  5. Norvasc 2.5 mg in the morning, 5 in the afternoon.  6. Irbesartan/hydrochlorothiazide 300/25 mg daily.   SOCIAL HISTORY:  The patient lives in [place] with her husband.  She  is retired.  She plays lot of golf.  She has to take care of her several  grandchildren.  She smokes half-a-pack a day and has done several years,  drinks minimal alcohol, and uses no illicit drugs.   PHYSICAL EXAMINATION:  VITAL SIGNS:  Today, blood pressure 164/94, heart  rate is 80 and regular.  GENERAL:  No apparent distress.  She is a well-appearing elderly female.  NECK:  No JVD.  Normal carotid upstrokes.  There are bilateral carotid  bruits left greater than right.  LUNGS:  Clear to auscultation bilaterally with normal respiratory  effort.  CARDIOVASCULAR:  Heart regular S1 and S2.  No S3 and S4.  There is a 2/6  systolic crescendo-decrescendo murmur at the right upper sternal border.  S2 is well preserved.  ABDOMEN:  Soft, nontender.  No hepatosplenomegaly.  EXTREMITIES:  There is trace peripheral edema.   ASSESSMENT AND PLAN:  This is a 77 year old with history of   hypertension, hypercholesterolemia, paroxysmal atrial fibrillation, and  carotid artery disease.  She comes back to Cardiology Clinic for  evaluation of her medical problems.  1. Atrial fibrillation.  The patient has documented paroxysmal atrial      fibrillation.  Her CHADS score is 2 (age, HTN).  She did not have      other symptoms while she was in atrial fibrillation, so I am not      sure how often she has been on atrial fibrillation in the past.      She is currently in sinus rhythm.  Her echocardiogram shows      preserved left ventricular function.  I think that she is at      reasonably high risk of stroke given her age and hypertension.      Therefore, I think it would be reasonable to continue her on      Coumadin.  She will also continue on a baby aspirin.  2. Carotid artery disease.  The patient does have 70-79% carotid      artery stenoses bilaterally on her ultrasound.  She has had no      stroke-like symptoms, there is no history of prior stroke.  She      does have bilateral carotid bruits.  She does have a significant      degree of stenosis.  I talked to her about seeing a vascular      surgeon, however, she is very reticent to go forward with this.  I      did tell her there is some risk of stroke.  However, she is      asymptomatic.  We will plan for now to keep her on aspirin and      Coumadin and we will followup in 1 year with a repeat ultrasound      and see what degree of progression there has been.  3. Hypertension.  The patient's blood pressure is quite high today.      We will increase her Norvasc to 10 mg daily and Toprol-XL to 150 mg      daily.  She will come back in 2 weeks for blood pressure check.  4. Hypercholesterolemia.  The patient's LDL was elevated at 135.      Given her peripheral arterial disease, she should have LDL at least      less than 100 and preferably less than 70.  I have already started      her on simvastatin 20 mg daily and she has  tolerated it well.  5. I did talk to the patient extensively about smoking cessation.  She      is going to try do this on      her and does not want medications for it.  6. I will see the patient back in 6 months.     Marca Ancona, MD  Electronically Signed    DM/MedQ  DD: 08/06/2008  DT: 08/06/2008  Job #: [id number]   cc:   [name] Daft. Timoteo Gaul, MD

## 2011-04-06 ENCOUNTER — Other Ambulatory Visit: Payer: Self-pay | Admitting: Internal Medicine

## 2011-04-06 DIAGNOSIS — I4891 Unspecified atrial fibrillation: Secondary | ICD-10-CM

## 2011-04-06 DIAGNOSIS — R0602 Shortness of breath: Secondary | ICD-10-CM

## 2011-04-06 DIAGNOSIS — R0989 Other specified symptoms and signs involving the circulatory and respiratory systems: Secondary | ICD-10-CM

## 2011-04-12 ENCOUNTER — Ambulatory Visit (INDEPENDENT_AMBULATORY_CARE_PROVIDER_SITE_OTHER): Payer: Medicare Other | Admitting: Emergency Medicine

## 2011-04-12 DIAGNOSIS — Z7901 Long term (current) use of anticoagulants: Secondary | ICD-10-CM

## 2011-04-12 DIAGNOSIS — I4891 Unspecified atrial fibrillation: Secondary | ICD-10-CM

## 2011-04-12 LAB — POCT INR: INR: 2.6

## 2011-04-18 ENCOUNTER — Encounter (INDEPENDENT_AMBULATORY_CARE_PROVIDER_SITE_OTHER): Payer: Medicare Other | Admitting: *Deleted

## 2011-04-18 DIAGNOSIS — I6529 Occlusion and stenosis of unspecified carotid artery: Secondary | ICD-10-CM

## 2011-04-18 DIAGNOSIS — R0989 Other specified symptoms and signs involving the circulatory and respiratory systems: Secondary | ICD-10-CM

## 2011-04-21 ENCOUNTER — Encounter: Payer: Self-pay | Admitting: Cardiology

## 2011-04-26 ENCOUNTER — Other Ambulatory Visit: Payer: Self-pay | Admitting: Cardiology

## 2011-04-26 DIAGNOSIS — I6529 Occlusion and stenosis of unspecified carotid artery: Secondary | ICD-10-CM

## 2011-05-10 ENCOUNTER — Ambulatory Visit (INDEPENDENT_AMBULATORY_CARE_PROVIDER_SITE_OTHER): Payer: Medicare Other | Admitting: Emergency Medicine

## 2011-05-10 DIAGNOSIS — I4891 Unspecified atrial fibrillation: Secondary | ICD-10-CM

## 2011-05-10 DIAGNOSIS — Z7901 Long term (current) use of anticoagulants: Secondary | ICD-10-CM

## 2011-06-06 ENCOUNTER — Ambulatory Visit (INDEPENDENT_AMBULATORY_CARE_PROVIDER_SITE_OTHER): Payer: Medicare Other | Admitting: Cardiology

## 2011-06-06 DIAGNOSIS — R0989 Other specified symptoms and signs involving the circulatory and respiratory systems: Secondary | ICD-10-CM

## 2011-06-06 DIAGNOSIS — E785 Hyperlipidemia, unspecified: Secondary | ICD-10-CM

## 2011-06-06 DIAGNOSIS — I739 Peripheral vascular disease, unspecified: Secondary | ICD-10-CM

## 2011-06-06 DIAGNOSIS — F17200 Nicotine dependence, unspecified, uncomplicated: Secondary | ICD-10-CM

## 2011-06-06 DIAGNOSIS — I10 Essential (primary) hypertension: Secondary | ICD-10-CM

## 2011-06-06 DIAGNOSIS — I4891 Unspecified atrial fibrillation: Secondary | ICD-10-CM

## 2011-06-06 LAB — POCT INR: INR: 2.4

## 2011-06-06 MED ORDER — CHLORTHALIDONE 25 MG PO TABS: ORAL_TABLET | ORAL | Status: DC

## 2011-06-06 NOTE — Patient Instructions (Addendum)
Decrease chlorthalidone to 12.5 mg one tablet daily. Follow up in 6 months with Dr. Shirlee Latch.

## 2011-06-07 ENCOUNTER — Encounter: Payer: Self-pay | Admitting: Cardiology

## 2011-06-07 ENCOUNTER — Ambulatory Visit (INDEPENDENT_AMBULATORY_CARE_PROVIDER_SITE_OTHER): Payer: Medicare Other | Admitting: Emergency Medicine

## 2011-06-07 DIAGNOSIS — F17200 Nicotine dependence, unspecified, uncomplicated: Secondary | ICD-10-CM | POA: Insufficient documentation

## 2011-06-07 NOTE — Assessment & Plan Note (Signed)
Patient's history of foot pain, decreased pedal pulses, and smoking as well as known carotid vascular disease worry me that she has PAD in her legs.  I set her up for arterial dopplers but she did not get them done.  I will continue to aggressively treat risk factors.  She has no foot lesions.

## 2011-06-07 NOTE — Assessment & Plan Note (Signed)
This appears to be persistent.  She denies any significant exertional symptoms.  Rate is controlled on current dose of Coreg and she is taking coumadin.  I will continue her on these meds for now.  Given minimal symptoms, I do not suggest that we try to cardiovert her at this point.

## 2011-06-07 NOTE — Assessment & Plan Note (Signed)
Bilateral moderate carotid stenosis.  Repeat dopplers in 12/12.

## 2011-06-07 NOTE — Progress Notes (Signed)
PCP: Dr. Lacie Scotts  77 yo with history of atrial fibrillation, HTN, carotid stenosis, and smoking presents for followup.  BP is borderline today but has been running systolic 120s-130s when she checks it at home.  She continues to be in atrial fibrillation with good rate control.  She is tolerating pravastatin with very mild myalgias.  She denies exertional shortness of breath or chest pain.  No palpitations.  Still playing golf though not as often recently due to low back pain.  Stable moderate bilateral internal carotid stenosis on carotid dopplers.    Labs (5/11): LDL 80, HDL 57, NT-proBNP 2130 Labs (6/11): K 4.3, creatinine 1.44, BNP 242  ECG: atrial fibrillation at 66, otherwise normal  Allergies (verified):  No Known Drug Allergies  Family History: The patient has had several sisters and brothers with atrial fibrillation.  She also has a niece who has atrial fibrillation.  Her father had myocardial infarction at age of 46 and her sister had congestive heart failure in the 55s.  She has also had several sisters who had strokes.   Social History: The patient lives in [place] with her husband.  She is retired.  She plays a lot of golf.  She helps take care of several grandchildren.  She smokes half-a-pack a day and has done so for years. She drinks minimal alcohol and uses no illicit drugs.     Past Medical History: 1. Osteoarthritis. 2. Hypertension. 3. Smoking.  4. Gastroesophageal reflux disease. 5. Hyperlipidemia.  Myalgias with Lipitor, Crestor, Zocor and did not tolerate Niaspan.  She does tolerate pravastatin.  6. Cholelithiasis. 7. Chronic kidney disease.  8. Impaired fasting glucose. 9. Atrial fibrillation.  This seems to have become persistent.  HR is controlled. Echo (7/11): EF 55-65%, no regional wall motion abnormalities, mild MR, normal RV size and systolic function, PA systolic pressure 42 mmHg.  10. Carotid stenosis. Carotid US (5/11): 60-79% bilateral ICA stenosis.    Carotid US (12/11) with stable 60-79% bilateral carotid stenosis and left subclavian stenosis without steal.  She does have bilateral carotid bruits.  Carotid US (6/12) with stable 60-79% bilateral ICA setnosis and bilateral subclavian stenosis.  11. Chronic low back pain.     Review of Systems        All systems reviewed and negative except as per HPI.   Current Outpatient Prescriptions  Medication Sig Dispense Refill  . amLODipine (NORVASC) 5 MG tablet Take 5 mg by mouth daily.        Marland Kitchen aspirin 81 MG EC tablet Take 81 mg by mouth daily.        . carvedilol (COREG) 25 MG tablet Take 25 mg by mouth 2 (two) times daily with a meal.        . chlorthalidone (HYGROTON) 25 MG tablet 12.5 mg take one tablet daily  30 tablet  3  . losartan (COZAAR) 100 MG tablet Take 100 mg by mouth daily.        . pravastatin (PRAVACHOL) 40 MG tablet Take 40 mg by mouth daily.        Marland Kitchen warfarin (COUMADIN) 5 MG tablet Take by mouth as directed.          BP 142/66  HR 66 General:  Well developed, well nourished, in no acute distress. Neck:  Neck supple, no JVD. No masses, thyromegaly or abnormal cervical nodes. Lungs:  Clear bilaterally to auscultation and percussion. Heart:  Non-displaced PMI, chest non-tender; irregular rate and rhythm, S1, S2 with 2/6 early systolic  murmur at RUSB.  Bilateral carotid bruits. 1+ PT pulse on the right, trace PT pulse on the left.  1+ ankle edema. Abdomen:  Bowel sounds positive; abdomen soft and non-tender without masses, organomegaly, or hernias noted. No hepatosplenomegaly. Extremities:  No clubbing or cyanosis. Neurologic:  Alert and oriented x 3. Psych:  Normal affect.

## 2011-06-07 NOTE — Assessment & Plan Note (Signed)
BP is still running a bit high.  She is taking chlorthalidone only 3 times a week.  I will have her decrease chlorthalidone to 12.5 mg daily but take it every day.

## 2011-06-07 NOTE — Assessment & Plan Note (Signed)
She has tolerated pravastatin.  Goal LDL < 70.  She has lipids done every 3 months with her PCP, I will try to get a copy of the most recent labs.

## 2011-06-07 NOTE — Assessment & Plan Note (Signed)
I counselled her again to quit smoking.

## 2011-07-05 ENCOUNTER — Ambulatory Visit (INDEPENDENT_AMBULATORY_CARE_PROVIDER_SITE_OTHER): Payer: Medicare Other | Admitting: Emergency Medicine

## 2011-07-05 DIAGNOSIS — Z7901 Long term (current) use of anticoagulants: Secondary | ICD-10-CM

## 2011-07-05 DIAGNOSIS — I4891 Unspecified atrial fibrillation: Secondary | ICD-10-CM

## 2011-07-18 ENCOUNTER — Encounter: Payer: Self-pay | Admitting: Family Medicine

## 2011-08-02 ENCOUNTER — Ambulatory Visit (INDEPENDENT_AMBULATORY_CARE_PROVIDER_SITE_OTHER): Payer: Medicare Other | Admitting: Emergency Medicine

## 2011-08-02 DIAGNOSIS — I4891 Unspecified atrial fibrillation: Secondary | ICD-10-CM

## 2011-08-02 DIAGNOSIS — Z7901 Long term (current) use of anticoagulants: Secondary | ICD-10-CM

## 2011-08-30 ENCOUNTER — Ambulatory Visit (INDEPENDENT_AMBULATORY_CARE_PROVIDER_SITE_OTHER): Payer: Medicare Other | Admitting: Emergency Medicine

## 2011-08-30 DIAGNOSIS — Z7901 Long term (current) use of anticoagulants: Secondary | ICD-10-CM

## 2011-08-30 DIAGNOSIS — I4891 Unspecified atrial fibrillation: Secondary | ICD-10-CM

## 2011-08-30 LAB — POCT INR: INR: 3.4

## 2011-09-27 ENCOUNTER — Ambulatory Visit (INDEPENDENT_AMBULATORY_CARE_PROVIDER_SITE_OTHER): Payer: Medicare Other | Admitting: Emergency Medicine

## 2011-09-27 DIAGNOSIS — I4891 Unspecified atrial fibrillation: Secondary | ICD-10-CM

## 2011-09-27 DIAGNOSIS — Z7901 Long term (current) use of anticoagulants: Secondary | ICD-10-CM

## 2011-09-27 LAB — POCT INR: INR: 2.8

## 2011-10-24 ENCOUNTER — Encounter (INDEPENDENT_AMBULATORY_CARE_PROVIDER_SITE_OTHER): Payer: Medicare Other | Admitting: *Deleted

## 2011-10-24 DIAGNOSIS — I6529 Occlusion and stenosis of unspecified carotid artery: Secondary | ICD-10-CM

## 2011-10-25 ENCOUNTER — Ambulatory Visit (INDEPENDENT_AMBULATORY_CARE_PROVIDER_SITE_OTHER): Payer: Medicare Other | Admitting: Emergency Medicine

## 2011-10-25 DIAGNOSIS — Z7901 Long term (current) use of anticoagulants: Secondary | ICD-10-CM

## 2011-10-25 DIAGNOSIS — I4891 Unspecified atrial fibrillation: Secondary | ICD-10-CM

## 2011-10-25 LAB — POCT INR: INR: 3.1

## 2011-10-31 ENCOUNTER — Other Ambulatory Visit: Payer: Self-pay | Admitting: Cardiology

## 2011-10-31 DIAGNOSIS — I6529 Occlusion and stenosis of unspecified carotid artery: Secondary | ICD-10-CM

## 2011-11-22 ENCOUNTER — Ambulatory Visit (INDEPENDENT_AMBULATORY_CARE_PROVIDER_SITE_OTHER): Payer: Medicare Other | Admitting: Emergency Medicine

## 2011-11-22 DIAGNOSIS — I4891 Unspecified atrial fibrillation: Secondary | ICD-10-CM

## 2011-11-22 DIAGNOSIS — Z7901 Long term (current) use of anticoagulants: Secondary | ICD-10-CM

## 2011-12-20 ENCOUNTER — Encounter: Payer: Medicare Other | Admitting: Emergency Medicine

## 2011-12-27 ENCOUNTER — Ambulatory Visit (INDEPENDENT_AMBULATORY_CARE_PROVIDER_SITE_OTHER): Payer: Medicare Other | Admitting: Emergency Medicine

## 2011-12-27 DIAGNOSIS — Z7901 Long term (current) use of anticoagulants: Secondary | ICD-10-CM

## 2011-12-27 DIAGNOSIS — I4891 Unspecified atrial fibrillation: Secondary | ICD-10-CM

## 2011-12-27 LAB — POCT INR: INR: 4.5

## 2012-01-10 ENCOUNTER — Ambulatory Visit (INDEPENDENT_AMBULATORY_CARE_PROVIDER_SITE_OTHER): Payer: Medicare Other | Admitting: *Deleted

## 2012-01-10 DIAGNOSIS — I4891 Unspecified atrial fibrillation: Secondary | ICD-10-CM

## 2012-01-10 DIAGNOSIS — Z7901 Long term (current) use of anticoagulants: Secondary | ICD-10-CM

## 2012-01-24 ENCOUNTER — Ambulatory Visit (INDEPENDENT_AMBULATORY_CARE_PROVIDER_SITE_OTHER): Payer: Medicare Other

## 2012-01-24 DIAGNOSIS — I4891 Unspecified atrial fibrillation: Secondary | ICD-10-CM

## 2012-01-24 DIAGNOSIS — Z7901 Long term (current) use of anticoagulants: Secondary | ICD-10-CM

## 2012-02-19 ENCOUNTER — Other Ambulatory Visit: Payer: Self-pay | Admitting: Cardiology

## 2012-02-21 ENCOUNTER — Ambulatory Visit (INDEPENDENT_AMBULATORY_CARE_PROVIDER_SITE_OTHER): Payer: Medicare Other

## 2012-02-21 DIAGNOSIS — I4891 Unspecified atrial fibrillation: Secondary | ICD-10-CM

## 2012-02-21 DIAGNOSIS — Z7901 Long term (current) use of anticoagulants: Secondary | ICD-10-CM

## 2012-03-20 ENCOUNTER — Encounter: Payer: Self-pay | Admitting: Cardiovascular Disease

## 2012-03-20 ENCOUNTER — Ambulatory Visit (INDEPENDENT_AMBULATORY_CARE_PROVIDER_SITE_OTHER): Payer: Medicare Other

## 2012-03-20 ENCOUNTER — Ambulatory Visit (INDEPENDENT_AMBULATORY_CARE_PROVIDER_SITE_OTHER): Payer: Medicare Other | Admitting: Cardiovascular Disease

## 2012-03-20 VITALS — BP 128/80 | HR 68 | Ht 66.0 in | Wt 160.5 lb

## 2012-03-20 DIAGNOSIS — F17200 Nicotine dependence, unspecified, uncomplicated: Secondary | ICD-10-CM

## 2012-03-20 DIAGNOSIS — E785 Hyperlipidemia, unspecified: Secondary | ICD-10-CM

## 2012-03-20 DIAGNOSIS — I4891 Unspecified atrial fibrillation: Secondary | ICD-10-CM

## 2012-03-20 DIAGNOSIS — Z7901 Long term (current) use of anticoagulants: Secondary | ICD-10-CM

## 2012-03-20 DIAGNOSIS — I10 Essential (primary) hypertension: Secondary | ICD-10-CM

## 2012-03-20 DIAGNOSIS — I6529 Occlusion and stenosis of unspecified carotid artery: Secondary | ICD-10-CM | POA: Insufficient documentation

## 2012-03-20 LAB — POCT INR: INR: 2.7

## 2012-03-20 NOTE — Patient Instructions (Addendum)
You are doing well.  Try to quit smoking Take  coreg 1/2 pill twice a day Cut the amlodipine in 1/2 to make swelling better  Please call us if you have new issues that need to be addressed before your next appt.  Your physician wants you to follow-up in: 12 months.  You will receive a reminder letter in the mail two months in advance. If you don't receive a letter, please call our office to schedule the follow-up appointment.

## 2012-03-20 NOTE — Assessment & Plan Note (Signed)
We have suggested she take Coreg 12.5 mg twice a day. She could cut the amlodipine and half or stop the amlodipine given her lower extremity swelling. She could take extra chlorthalidone for hypertension as tolerated.

## 2012-03-20 NOTE — Progress Notes (Signed)
Patient ID: [name] [name], female    DOB: 14-Apr-1928, 78 y.o.   MRN: [id number]  HPI Comments: 78 yo with history of atrial fibrillation, HTN, 60-79% bilateral carotid stenosis, continued smoking presents for followup.    She reports that overall she is doing well. She continues to have mild lower extremity edema. She has been less active recently secondary to the weather. Continues to smoke less than one half pack per day. She denies exertional shortness of breath or chest pain. She has been taking Coreg 25 mg in the morning, none in the evening. She is unable to tolerate full dose chlorthalidone secondary to incontinence.  Echo in 7/11 showed preserved EF with mild mitral regurgitaiton and mild pulmonary hypertension (PA systolic pressure 42 mmHg).  Carotids repeated in 12/11 were stable.      Most recent cholesterol showed total cholesterol 153, LDL 76   ECG: atrial fibrillation at 68 beats per minute, otherwise normal     Outpatient Encounter Prescriptions as of 03/20/2012  Medication Sig Dispense Refill  . amLODipine (NORVASC) 5 MG tablet Take 5 mg by mouth daily.        Marland Kitchen aspirin 81 MG EC tablet Take 81 mg by mouth daily.        . carvedilol (COREG) 25 MG tablet Take 25 mg by mouth 2 (two) times daily with a meal.        . chlorthalidone (HYGROTON) 25 MG tablet TAKE 1/2 TABLET DAILY  30 tablet  3  . losartan (COZAAR) 100 MG tablet Take 100 mg by mouth daily.        . pravastatin (PRAVACHOL) 40 MG tablet Take 40 mg by mouth daily.        Marland Kitchen warfarin (COUMADIN) 5 MG tablet Take by mouth as directed.         Review of Systems  Constitutional: Negative.   HENT: Negative.   Eyes: Negative.   Respiratory: Negative.   Cardiovascular: Positive for leg swelling.  Gastrointestinal: Negative.   Musculoskeletal: Negative.   Skin: Negative.   Neurological: Negative.   Hematological: Negative.   Psychiatric/Behavioral: Negative.   All other systems reviewed and are negative.    BP  128/80  Pulse 68  Ht 5\' 6"  (1.676 m)  Wt 160 lb 8 oz (72.802 kg)  BMI 25.91 kg/m2  Physical Exam  Nursing note and vitals reviewed. Constitutional: She is oriented to person, place, and time. She appears well-developed and well-nourished.  HENT:  Head: Normocephalic.  Nose: Nose normal.  Mouth/Throat: Oropharynx is clear and moist.  Eyes: Conjunctivae are normal. Pupils are equal, round, and reactive to light.  Neck: Normal range of motion. Neck supple. No JVD present. Carotid bruit is present.  Cardiovascular: Normal rate, regular rhythm, S1 normal, S2 normal and intact distal pulses.  Exam reveals no gallop and no friction rub.   Murmur heard.  Crescendo systolic murmur is present with a grade of 2/6       Trace edema around the ankles bilaterally  Pulmonary/Chest: Effort normal and breath sounds normal. No respiratory distress. She has no wheezes. She has no rales. She exhibits no tenderness.  Abdominal: Soft. Bowel sounds are normal. She exhibits no distension. There is no tenderness.  Musculoskeletal: Normal range of motion. She exhibits no edema and no tenderness.  Lymphadenopathy:    She has no cervical adenopathy.  Neurological: She is alert and oriented to person, place, and time. Coordination normal.  Skin: Skin is warm and dry. No rash  noted. No erythema.  Psychiatric: She has a normal mood and affect. Her behavior is normal. Judgment and thought content normal.         Assessment and Plan

## 2012-03-20 NOTE — Assessment & Plan Note (Signed)
We have encouraged her to continue to work on weaning her cigarettes and smoking cessation. She will continue to work on this and does not want any assistance with chantix.  

## 2012-03-20 NOTE — Assessment & Plan Note (Signed)
Lipids slightly above goal. We have recommended weight loss and diet. She does not want a higher dose of pravastatin secondary to myalgias.

## 2012-03-20 NOTE — Assessment & Plan Note (Signed)
Heart rate is well-controlled. She is on warfarin. She takes Coreg only once a day. We have suggested she take Coreg 12.5 mg twice a day.

## 2012-03-20 NOTE — Assessment & Plan Note (Signed)
60-79% bilateral carotid stenosis. We have recommended smoking cessation continued pravastatin 40 mg daily. Small weight loss to achieve goal LDL less than 70. Repeat ultrasound in several months.

## 2012-05-01 ENCOUNTER — Ambulatory Visit (INDEPENDENT_AMBULATORY_CARE_PROVIDER_SITE_OTHER): Payer: Medicare Other

## 2012-05-01 DIAGNOSIS — I4891 Unspecified atrial fibrillation: Secondary | ICD-10-CM

## 2012-05-01 DIAGNOSIS — Z7901 Long term (current) use of anticoagulants: Secondary | ICD-10-CM

## 2012-05-01 LAB — POCT INR: INR: 2.8

## 2012-05-09 ENCOUNTER — Encounter (INDEPENDENT_AMBULATORY_CARE_PROVIDER_SITE_OTHER): Payer: Medicare Other

## 2012-05-09 DIAGNOSIS — R0989 Other specified symptoms and signs involving the circulatory and respiratory systems: Secondary | ICD-10-CM

## 2012-05-09 DIAGNOSIS — I6529 Occlusion and stenosis of unspecified carotid artery: Secondary | ICD-10-CM

## 2012-06-12 ENCOUNTER — Ambulatory Visit (INDEPENDENT_AMBULATORY_CARE_PROVIDER_SITE_OTHER): Payer: Medicare Other

## 2012-06-12 DIAGNOSIS — Z7901 Long term (current) use of anticoagulants: Secondary | ICD-10-CM

## 2012-06-12 DIAGNOSIS — I4891 Unspecified atrial fibrillation: Secondary | ICD-10-CM

## 2012-06-12 LAB — POCT INR: INR: 2.9

## 2012-07-24 ENCOUNTER — Ambulatory Visit (INDEPENDENT_AMBULATORY_CARE_PROVIDER_SITE_OTHER): Payer: Medicare Other

## 2012-07-24 DIAGNOSIS — I4891 Unspecified atrial fibrillation: Secondary | ICD-10-CM

## 2012-07-24 DIAGNOSIS — Z7901 Long term (current) use of anticoagulants: Secondary | ICD-10-CM

## 2012-07-24 LAB — POCT INR: INR: 2.3

## 2012-09-04 ENCOUNTER — Ambulatory Visit (INDEPENDENT_AMBULATORY_CARE_PROVIDER_SITE_OTHER): Payer: Medicare Other

## 2012-09-04 DIAGNOSIS — I4891 Unspecified atrial fibrillation: Secondary | ICD-10-CM

## 2012-09-04 DIAGNOSIS — Z7901 Long term (current) use of anticoagulants: Secondary | ICD-10-CM

## 2012-10-02 ENCOUNTER — Ambulatory Visit (INDEPENDENT_AMBULATORY_CARE_PROVIDER_SITE_OTHER): Payer: Medicare Other

## 2012-10-02 ENCOUNTER — Other Ambulatory Visit: Payer: Self-pay | Admitting: Cardiovascular Disease

## 2012-10-02 DIAGNOSIS — Z7901 Long term (current) use of anticoagulants: Secondary | ICD-10-CM

## 2012-10-02 DIAGNOSIS — I4891 Unspecified atrial fibrillation: Secondary | ICD-10-CM

## 2012-10-02 LAB — POCT INR: INR: 2.5

## 2012-10-02 MED ORDER — LOSARTAN POTASSIUM 100 MG PO TABS: 100.0000 mg | ORAL_TABLET | Freq: Every day | ORAL | Status: DC

## 2012-10-02 MED ORDER — CARVEDILOL 25 MG PO TABS: ORAL_TABLET | ORAL | Status: DC

## 2012-10-02 NOTE — Telephone Encounter (Signed)
Refilled Carvedilol and losartan.

## 2012-10-30 ENCOUNTER — Ambulatory Visit (INDEPENDENT_AMBULATORY_CARE_PROVIDER_SITE_OTHER): Payer: Medicare Other

## 2012-10-30 DIAGNOSIS — I4891 Unspecified atrial fibrillation: Secondary | ICD-10-CM

## 2012-10-30 DIAGNOSIS — Z7901 Long term (current) use of anticoagulants: Secondary | ICD-10-CM

## 2012-10-30 LAB — POCT INR: INR: 2.1

## 2012-12-04 ENCOUNTER — Ambulatory Visit (INDEPENDENT_AMBULATORY_CARE_PROVIDER_SITE_OTHER): Payer: Medicare Other

## 2012-12-04 DIAGNOSIS — I4891 Unspecified atrial fibrillation: Secondary | ICD-10-CM

## 2012-12-04 DIAGNOSIS — Z7901 Long term (current) use of anticoagulants: Secondary | ICD-10-CM

## 2012-12-04 LAB — POCT INR: INR: 2.1

## 2012-12-04 MED ORDER — PRAVASTATIN SODIUM 40 MG PO TABS: 40.0000 mg | ORAL_TABLET | Freq: Every day | ORAL | Status: DC

## 2012-12-04 MED ORDER — CHLORTHALIDONE 25 MG PO TABS: 25.0000 mg | ORAL_TABLET | Freq: Every day | ORAL | Status: DC

## 2012-12-04 MED ORDER — WARFARIN SODIUM 5 MG PO TABS: ORAL_TABLET | ORAL | Status: DC

## 2013-01-08 ENCOUNTER — Ambulatory Visit (INDEPENDENT_AMBULATORY_CARE_PROVIDER_SITE_OTHER): Payer: Medicare Other

## 2013-01-08 DIAGNOSIS — Z7901 Long term (current) use of anticoagulants: Secondary | ICD-10-CM

## 2013-01-08 DIAGNOSIS — I4891 Unspecified atrial fibrillation: Secondary | ICD-10-CM

## 2013-02-05 ENCOUNTER — Telehealth: Payer: Self-pay | Admitting: *Deleted

## 2013-02-05 MED ORDER — PRAVASTATIN SODIUM 40 MG PO TABS: 40.0000 mg | ORAL_TABLET | Freq: Every day | ORAL | Status: DC

## 2013-02-05 NOTE — Telephone Encounter (Signed)
Refilled Pravastatin sent to [facility] pharmacy. Pt requesting 90 day supply.

## 2013-02-07 ENCOUNTER — Other Ambulatory Visit: Payer: Self-pay | Admitting: Cardiovascular Disease

## 2013-02-07 NOTE — Telephone Encounter (Signed)
Refilled Carvedilol #30 Refill#3 and Losaratan #30 Refill#3 sent to [facility] pharmacy.

## 2013-02-07 NOTE — Telephone Encounter (Signed)
Pt would like refill on Carvedilol and Losartan.

## 2013-02-19 ENCOUNTER — Ambulatory Visit (INDEPENDENT_AMBULATORY_CARE_PROVIDER_SITE_OTHER): Payer: Medicare Other

## 2013-02-19 DIAGNOSIS — Z7901 Long term (current) use of anticoagulants: Secondary | ICD-10-CM

## 2013-02-19 DIAGNOSIS — I4891 Unspecified atrial fibrillation: Secondary | ICD-10-CM

## 2013-02-19 LAB — POCT INR: INR: 2.6

## 2013-03-10 ENCOUNTER — Ambulatory Visit (INDEPENDENT_AMBULATORY_CARE_PROVIDER_SITE_OTHER): Payer: Medicare Other | Admitting: Internal Medicine

## 2013-03-10 ENCOUNTER — Encounter: Payer: Self-pay | Admitting: Internal Medicine

## 2013-03-10 VITALS — BP 200/100 | HR 92 | Temp 98.5°F | Ht 65.5 in | Wt 158.5 lb

## 2013-03-10 DIAGNOSIS — I10 Essential (primary) hypertension: Secondary | ICD-10-CM

## 2013-03-10 DIAGNOSIS — I4891 Unspecified atrial fibrillation: Secondary | ICD-10-CM

## 2013-03-10 DIAGNOSIS — L989 Disorder of the skin and subcutaneous tissue, unspecified: Secondary | ICD-10-CM

## 2013-03-10 DIAGNOSIS — F17200 Nicotine dependence, unspecified, uncomplicated: Secondary | ICD-10-CM

## 2013-03-10 DIAGNOSIS — E785 Hyperlipidemia, unspecified: Secondary | ICD-10-CM

## 2013-03-10 MED ORDER — CLONAZEPAM 0.5 MG PO TABS: ORAL_TABLET | ORAL | Status: DC

## 2013-03-12 ENCOUNTER — Encounter: Payer: Self-pay | Admitting: Emergency Medicine

## 2013-03-20 ENCOUNTER — Other Ambulatory Visit (INDEPENDENT_AMBULATORY_CARE_PROVIDER_SITE_OTHER): Payer: Medicare Other

## 2013-03-20 DIAGNOSIS — E785 Hyperlipidemia, unspecified: Secondary | ICD-10-CM

## 2013-03-20 DIAGNOSIS — I10 Essential (primary) hypertension: Secondary | ICD-10-CM

## 2013-03-20 DIAGNOSIS — I4891 Unspecified atrial fibrillation: Secondary | ICD-10-CM

## 2013-03-20 LAB — LIPID PANEL
Cholesterol: 131 mg/dL (ref 0–200)
Triglycerides: 62 mg/dL (ref 0.0–149.0)
VLDL: 12.4 mg/dL (ref 0.0–40.0)

## 2013-03-20 LAB — HEPATIC FUNCTION PANEL
ALT: 17 U/L (ref 0–35)
AST: 20 U/L (ref 0–37)
Bilirubin, Direct: 0.2 mg/dL (ref 0.0–0.3)
Total Bilirubin: 1.4 mg/dL — ABNORMAL HIGH (ref 0.3–1.2)

## 2013-03-20 LAB — CBC WITH DIFFERENTIAL/PLATELET
Basophils Absolute: 0 10*3/uL (ref 0.0–0.1)
Basophils Relative: 0.6 % (ref 0.0–3.0)
Eosinophils Absolute: 0.1 10*3/uL (ref 0.0–0.7)
MCHC: 35 g/dL (ref 30.0–36.0)
MCV: 99.2 fl (ref 78.0–100.0)
Monocytes Absolute: 0.4 10*3/uL (ref 0.1–1.0)
Neutrophils Relative %: 60.6 % (ref 43.0–77.0)
RBC: 3.43 Mil/uL — ABNORMAL LOW (ref 3.87–5.11)
RDW: 13 % (ref 11.5–14.6)

## 2013-03-20 LAB — BASIC METABOLIC PANEL
BUN: 13 mg/dL (ref 6–23)
Creatinine, Ser: 1 mg/dL (ref 0.4–1.2)
GFR: 57 mL/min — ABNORMAL LOW (ref >60.00)
Potassium: 4.4 mEq/L (ref 3.5–5.1)

## 2013-03-23 ENCOUNTER — Encounter: Payer: Self-pay | Admitting: Internal Medicine

## 2013-03-23 NOTE — Progress Notes (Signed)
Subjective:    Patient ID: [name] [name], female    DOB: [birth year], 79 y.o.   MRN: [id number]  HPI 79 year old female with past history of hypertension, hypercholesterolemia, atrial fibrillation, carotid stenosis and peripheral vascular disease.  She comes in today to follow up on these issues as well as to establish care.  Has seen Dr Maryln Gottron.  Seeing cardiology.  Previously saw Dr Jearld Pies.  Now seeing Dr Mariah Milling.  On coumadin for afib.  They are following her pt/inr.  She states her blood pressure checks have been averaging 130s/80.  States she is just anxious today.  She denies any chest pain or tightness.  Breathing stable.  Some shoulder and back pain - flares intermittently.     Past Medical History  Diagnosis Date  . Osteoarthritis   . Hypertension   . GERD (gastroesophageal reflux disease)   . Hyperlipidemia   . Cholelithiasis   . Chronic kidney disease   . Impaired fasting glucose   . Atrial fibrillation   . Carotid stenosis   . Chronic low back pain     Outpatient Encounter Prescriptions as of 03/10/2013  Medication Sig Dispense Refill  . amLODipine (NORVASC) 5 MG tablet Take 5 mg by mouth daily.        Marland Kitchen aspirin 81 MG EC tablet Take 81 mg by mouth daily.        . carvedilol (COREG) 25 MG tablet TAKE 1/2 PILL TWICE DAILY.  30 tablet  3  . chlorthalidone (HYGROTON) 25 MG tablet Take 1 tablet (25 mg total) by mouth daily.  30 tablet  6  . losartan (COZAAR) 100 MG tablet TAKE 1 TABLET (100 MG TOTAL) BY MOUTH DAILY.  30 tablet  3  . omeprazole (PRILOSEC) 20 MG capsule Take 20 mg by mouth daily.      . pravastatin (PRAVACHOL) 40 MG tablet Take 1 tablet (40 mg total) by mouth daily.  90 tablet  3  . warfarin (COUMADIN) 5 MG tablet Take as directed by anticoagulation clinic  30 tablet  3  . clonazePAM (KLONOPIN) 0.5 MG tablet 1/2 - 1 tablet q day prn  20 tablet  0   No facility-administered encounter medications on file as of 03/10/2013.    Review of Systems Patient  denies any headache, lightheadedness or dizziness.  No sinus or allergy symptoms.  No chest pain, tightness or palpitations.  No increased shortness of breath, cough or congestion.  No acid reflux.  No nausea or vomiting.  No abdominal pain or cramping.  No bowel change, such as diarrhea, constipation, BRBPR or melana.  No urine change.  Some intermittent shoulder and back pain.  Previously had an abdominal ultrasound.  Gallstones present.  Does smoke - < 10 cigarettes per day.       Objective:   Physical Exam Filed Vitals:   03/10/13 1400  BP: 200/100  Pulse: 92  Temp: 98.5 F (36.9 C)   Blood pressure recheck:  61-75/14  79 year old female in no acute distress.   HEENT:  Nares- clear.  Oropharynx - without lesions. NECK:  Supple.  Nontender.  No audible bruit.  HEART:  Appears to be regular. LUNGS:  No crackles or wheezing audible.  Respirations even and unlabored.  RADIAL PULSE:  Equal bilaterally.   ABDOMEN:  Soft, nontender.  Bowel sounds present and normal.  No audible abdominal bruit.   EXTREMITIES:  No increased edema present.  DP pulses palpable and equal bilaterally.  Assessment & Plan:  DERMATOLOGY.  Leg lesions.  Refer to dermatology.   HEALTH MAINTENANCE.  Schedule a physical for next visit.  Obtain outside records for review.

## 2013-03-24 ENCOUNTER — Encounter: Payer: Self-pay | Admitting: Internal Medicine

## 2013-03-24 NOTE — Assessment & Plan Note (Signed)
Blood pressure elevated in the office today.  States normally controlled.  Check metabolic panel. Have her spot check her pressure.  Get her back in soon to reassess.

## 2013-03-24 NOTE — Assessment & Plan Note (Signed)
On pravastatin.  Low cholesterol diet and exercise.  Check lipid panel and liver function.    

## 2013-03-24 NOTE — Assessment & Plan Note (Signed)
Smokes <10 cigarettes per day.  Discussed the need to quit smoking.  Follow.    

## 2013-03-24 NOTE — Assessment & Plan Note (Signed)
Heart rate controlled.  On coumadin.  Followed by Dr Gollan.   

## 2013-03-24 NOTE — Assessment & Plan Note (Signed)
With documented carotid stenosis.  States followed by Dr Gollan.  On coumadin.    

## 2013-04-02 ENCOUNTER — Ambulatory Visit (INDEPENDENT_AMBULATORY_CARE_PROVIDER_SITE_OTHER): Payer: Medicare Other

## 2013-04-02 DIAGNOSIS — I4891 Unspecified atrial fibrillation: Secondary | ICD-10-CM

## 2013-04-02 DIAGNOSIS — Z7901 Long term (current) use of anticoagulants: Secondary | ICD-10-CM

## 2013-04-09 ENCOUNTER — Ambulatory Visit (INDEPENDENT_AMBULATORY_CARE_PROVIDER_SITE_OTHER): Payer: Medicare Other | Admitting: Cardiovascular Disease

## 2013-04-09 ENCOUNTER — Encounter: Payer: Self-pay | Admitting: Cardiovascular Disease

## 2013-04-09 ENCOUNTER — Encounter: Payer: Self-pay | Admitting: Internal Medicine

## 2013-04-09 ENCOUNTER — Ambulatory Visit (INDEPENDENT_AMBULATORY_CARE_PROVIDER_SITE_OTHER): Payer: Medicare Other | Admitting: Internal Medicine

## 2013-04-09 VITALS — BP 178/100 | HR 84 | Temp 98.1°F | Ht 65.5 in | Wt 157.5 lb

## 2013-04-09 VITALS — BP 160/82 | HR 66 | Ht 65.0 in | Wt 158.8 lb

## 2013-04-09 DIAGNOSIS — I6529 Occlusion and stenosis of unspecified carotid artery: Secondary | ICD-10-CM

## 2013-04-09 DIAGNOSIS — E785 Hyperlipidemia, unspecified: Secondary | ICD-10-CM

## 2013-04-09 DIAGNOSIS — D696 Thrombocytopenia, unspecified: Secondary | ICD-10-CM

## 2013-04-09 DIAGNOSIS — I739 Peripheral vascular disease, unspecified: Secondary | ICD-10-CM

## 2013-04-09 DIAGNOSIS — R17 Unspecified jaundice: Secondary | ICD-10-CM

## 2013-04-09 DIAGNOSIS — I4891 Unspecified atrial fibrillation: Secondary | ICD-10-CM

## 2013-04-09 DIAGNOSIS — F17200 Nicotine dependence, unspecified, uncomplicated: Secondary | ICD-10-CM

## 2013-04-09 DIAGNOSIS — I10 Essential (primary) hypertension: Secondary | ICD-10-CM

## 2013-04-09 DIAGNOSIS — D649 Anemia, unspecified: Secondary | ICD-10-CM | POA: Insufficient documentation

## 2013-04-09 LAB — CBC WITH DIFFERENTIAL/PLATELET
Basophils Absolute: 0 10*3/uL (ref 0.0–0.1)
Eosinophils Absolute: 0.1 10*3/uL (ref 0.0–0.7)
HCT: 35.2 % — ABNORMAL LOW (ref 36.0–46.0)
Lymphs Abs: 1.7 10*3/uL (ref 0.7–4.0)
MCHC: 34.3 g/dL (ref 30.0–36.0)
MCV: 99.3 fl (ref 78.0–100.0)
Monocytes Absolute: 0.5 10*3/uL (ref 0.1–1.0)
Platelets: 125 10*3/uL — ABNORMAL LOW (ref 150.0–400.0)
RDW: 12.8 % (ref 11.5–14.6)

## 2013-04-09 LAB — HEPATIC FUNCTION PANEL
ALT: 19 U/L (ref 0–35)
AST: 19 U/L (ref 0–37)
Alkaline Phosphatase: 49 U/L (ref 39–117)
Total Bilirubin: 1.5 mg/dL — ABNORMAL HIGH (ref 0.3–1.2)

## 2013-04-09 LAB — IBC PANEL: Saturation Ratios: 41 % (ref 20.0–50.0)

## 2013-04-09 MED ORDER — CLONIDINE HCL 0.1 MG PO TABS: 0.1000 mg | ORAL_TABLET | Freq: Two times a day (BID) | ORAL | Status: DC

## 2013-04-09 NOTE — Assessment & Plan Note (Signed)
On pravastatin.  Low cholesterol diet and exercise.  Follow lipid panel and liver function.  Lipid panel 03/20/13 revealed total cholesterol 131, triglycerides 62, HDL 54 and LDL 65.

## 2013-04-09 NOTE — Assessment & Plan Note (Signed)
Blood pressure as outlined.  Medication regimen as outlined.  Will increase chlorthalidone to 25mg  one whole tablet per day.  Follow pressures.  Get her back in soon to reassess.  May need to consider increasing carvedilol.

## 2013-04-09 NOTE — Patient Instructions (Addendum)
Blood pressure is high Please start clonidine twice a day  Please monitor your blood pressure  Please call us if you have new issues that need to be addressed before your next appt.  Your physician wants you to follow-up in: 1 month.

## 2013-04-09 NOTE — Assessment & Plan Note (Signed)
Heart rate controlled.  On coumadin.  Followed by Dr Gollan.   

## 2013-04-09 NOTE — Assessment & Plan Note (Signed)
We have encouraged her to continue to work on weaning her cigarettes and smoking cessation. She will continue to work on this and does not want any assistance with chantix.  

## 2013-04-09 NOTE — Assessment & Plan Note (Signed)
Platelet count decreased last lab check.  Will recheck today.

## 2013-04-09 NOTE — Assessment & Plan Note (Signed)
With documented carotid stenosis.  States followed by Dr Mariah Milling.  On coumadin.  Due to see Dr Mariah Milling today.

## 2013-04-09 NOTE — Assessment & Plan Note (Signed)
We spent most of her day today talking about her blood pressure. She does not want higher dose chlorthalidone as she has incontinence. (Previously also had renal dysfunction and has improved). She does not want higher heart rate wall if she can use something else as she thinks this caused fatigue.   We have discussed various other medication options. We will start clonidine 0.1 mg twice a day and continue her other medications. Clonidine can be titrated up as tolerated. Other options include hydralazine 3 times a day (would likely need 50 mg or more), isosorbide, Cardura.

## 2013-04-09 NOTE — Assessment & Plan Note (Signed)
Continue aggressive cholesterol management. Encouraged to stop smoking.

## 2013-04-09 NOTE — Assessment & Plan Note (Signed)
Cholesterol is at goal on the current lipid regimen. No changes to the medications were made.  

## 2013-04-09 NOTE — Assessment & Plan Note (Signed)
Smokes <10 cigarettes per day.  Discussed the need to quit smoking.  Follow.

## 2013-04-09 NOTE — Assessment & Plan Note (Signed)
Hgb slightly decreased on last blood check.  Recheck today.  Also check iron studies and B12.

## 2013-04-09 NOTE — Progress Notes (Signed)
Patient ID: [name] [name], female    DOB: 31-Dec-1927, 79 y.o.   MRN: [id number]  HPI Comments: 79 yo with history of atrial fibrillation, HTN, 60-79% bilateral carotid stenosis, continued smoking presents for followup.   She has not been checking her blood pressure at home. Saw Dr. Lorin Picket today and blood pressure was elevated. She understands that she is supposed to increase her carvedilol to 25 mg twice a day for high blood pressure.  She states that on high-dose carvedilol, she had fatigue and did not tolerate this dose. Reading the note from Dr. Lorin Picket, she has suggested increasing the chlorthalidone to a full pill. She states that she is unable to tolerate a full pill as she has incontinence, polyuria and is not happy.  She feels better on a half pill.  Amlodipine was weaned off after her last visit for lower extremity edema. Edema has since resolved.  She continues to smoke less than one half pack per day. She denies exertional shortness of breath or chest pain.   Previous blood work last year showed elevated creatinine. This has improved on lower chlorthalidone  Echo in 7/11 showed preserved EF with mild mitral regurgitaiton and mild pulmonary hypertension (PA systolic pressure 42 mmHg).  Carotids repeated in 12/11 were stable.    cholesterol showed total cholesterol 153, LDL 76   ECG: atrial fibrillation at 66 beats per minute, otherwise normal     Outpatient Encounter Prescriptions as of 04/09/2013  Medication Sig Dispense Refill  . aspirin 81 MG EC tablet Take 81 mg by mouth daily.        . carvedilol (COREG) 12.5 MG tablet  take twice a day       . chlorthalidone (HYGROTON) 25 MG tablet Take 1 tablet (25 mg total) by mouth daily.  30 tablet  6  . clonazePAM (KLONOPIN) 0.5 MG tablet 1/2 - 1 tablet q day prn  20 tablet  0  . losartan (COZAAR) 100 MG tablet TAKE 1 TABLET (100 MG TOTAL) BY MOUTH DAILY.  30 tablet  3  . omeprazole (PRILOSEC) 20 MG capsule Take 20 mg by mouth  daily.      . pravastatin (PRAVACHOL) 40 MG tablet Take 1 tablet (40 mg total) by mouth daily.  90 tablet  3  . warfarin (COUMADIN) 5 MG tablet Take as directed by anticoagulation clinic  30 tablet  3  . [DISCONTINUED] carvedilol (COREG) 25 MG tablet TAKE 1/2 PILL TWICE DAILY.  30 tablet  3  . [DISCONTINUED] amLODipine (NORVASC) 5 MG tablet Take 5 mg by mouth daily.         No facility-administered encounter medications on file as of 04/09/2013.   Review of Systems  Constitutional: Negative.   HENT: Negative.   Eyes: Negative.   Respiratory: Negative.   Gastrointestinal: Negative.   Musculoskeletal: Negative.   Skin: Negative.   Neurological: Negative.   Psychiatric/Behavioral: Negative.   All other systems reviewed and are negative.    BP 160/82  Pulse 66  Ht 5\' 5"  (1.651 m)  Wt 158 lb 12 oz (72.009 kg)  BMI 26.42 kg/m2 Repeat blood pressure 180/70 bilaterally  Physical Exam  Nursing note and vitals reviewed. Constitutional: She is oriented to person, place, and time. She appears well-developed and well-nourished.  HENT:  Head: Normocephalic.  Nose: Nose normal.  Mouth/Throat: Oropharynx is clear and moist.  Eyes: Conjunctivae are normal. Pupils are equal, round, and reactive to light.  Neck: Normal range of motion. Neck supple. No  JVD present. Carotid bruit is present.  Cardiovascular: Normal rate, regular rhythm, S1 normal, S2 normal and intact distal pulses.  Exam reveals no gallop and no friction rub.   Murmur heard.  Crescendo systolic murmur is present with a grade of 2/6  Trace edema around the ankles bilaterally  Pulmonary/Chest: Effort normal and breath sounds normal. No respiratory distress. She has no wheezes. She has no rales. She exhibits no tenderness.  Abdominal: Soft. Bowel sounds are normal. She exhibits no distension. There is no tenderness.  Musculoskeletal: Normal range of motion. She exhibits no edema and no tenderness.  Lymphadenopathy:    She has  no cervical adenopathy.  Neurological: She is alert and oriented to person, place, and time. Coordination normal.  Skin: Skin is warm and dry. No rash noted. No erythema.  Psychiatric: She has a normal mood and affect. Her behavior is normal. Judgment and thought content normal.    Assessment and Plan

## 2013-04-09 NOTE — Assessment & Plan Note (Signed)
Legs doing better - per pt.  Is followed by cardiology for her carotid disease.   

## 2013-04-09 NOTE — Progress Notes (Signed)
Subjective:    Patient ID: [name] [name], female    DOB: [birth year], 79 y.o.   MRN: [id number]  HPI 79 year old female with past history of hypertension, hypercholesterolemia, atrial fibrillation, carotid stenosis and peripheral vascular disease.  She comes in today for a scheduled follow up.  Here to follow up regarding her blood pressure.  Sees cardiology.  Scheduled to see Dr Mariah Milling today.  On coumadin for afib.  They are following her pt/inr.  Blood pressure is elevated.  Has not been monitoring her blood pressure.  She denies any chest pain or tightness.  Breathing stable.  She has previously cut her medication doses.  She is now taking carvedilol 1/2 (25mg ) bid, chlorthalidone 25mg  1/2 tablet q day and Losartan 100mg  q day.  Not on amlodipine.  Had increased fatigue and felt washed out when she was on her previous medication regimen.     Past Medical History  Diagnosis Date  . Osteoarthritis   . Hypertension   . GERD (gastroesophageal reflux disease)   . Hyperlipidemia   . Cholelithiasis   . Chronic kidney disease   . Impaired fasting glucose   . Atrial fibrillation   . Carotid stenosis   . Chronic low back pain     Outpatient Encounter Prescriptions as of 04/09/2013  Medication Sig Dispense Refill  . amLODipine (NORVASC) 5 MG tablet Take 5 mg by mouth daily.        Marland Kitchen aspirin 81 MG EC tablet Take 81 mg by mouth daily.        . carvedilol (COREG) 25 MG tablet TAKE 1/2 PILL TWICE DAILY.  30 tablet  3  . chlorthalidone (HYGROTON) 25 MG tablet Take 1 tablet (25 mg total) by mouth daily.  30 tablet  6  . clonazePAM (KLONOPIN) 0.5 MG tablet 1/2 - 1 tablet q day prn  20 tablet  0  . losartan (COZAAR) 100 MG tablet TAKE 1 TABLET (100 MG TOTAL) BY MOUTH DAILY.  30 tablet  3  . omeprazole (PRILOSEC) 20 MG capsule Take 20 mg by mouth daily.      . pravastatin (PRAVACHOL) 40 MG tablet Take 1 tablet (40 mg total) by mouth daily.  90 tablet  3  . warfarin (COUMADIN) 5 MG tablet Take as  directed by anticoagulation clinic  30 tablet  3   No facility-administered encounter medications on file as of 04/09/2013.    Review of Systems Patient denies any headache, lightheadedness or dizziness.  No sinus or allergy symptoms.  No chest pain, tightness or palpitations.  No increased shortness of breath, cough or congestion.  No acid reflux.  No nausea or vomiting.  No abdominal pain or cramping.  No bowel change, such as diarrhea, constipation, BRBPR or melana.  No urine change.  Does smoke - < 10 cigarettes per day.   Does not desire to quit.  Not checking her blood pressure.     Objective:   Physical Exam  Filed Vitals:   04/09/13 1125  BP: 178/100  Pulse: 84  Temp: 98.1 F (36.7 C)   Blood pressure recheck:  82/45-38  79 year old female in no acute distress.   HEENT:  Nares- clear.  Oropharynx - without lesions. NECK:  Supple.  Nontender.  No audible bruit.  HEART:  Appears to be regular. LUNGS:  No crackles or wheezing audible.  Respirations even and unlabored.  RADIAL PULSE:  Equal bilaterally.   ABDOMEN:  Soft, nontender.  Bowel sounds present and normal.  No audible abdominal bruit.   EXTREMITIES:  No increased edema present.  DP pulses palpable and equal bilaterally.          Assessment & Plan:  HYPERBILIRUBINEMIA.  Recheck liver panel today.   HEALTH MAINTENANCE.  Schedule a physical in the future.  Need records for review.

## 2013-04-10 LAB — FERRITIN: Ferritin: 23.3 ng/mL (ref 10.0–291.0)

## 2013-04-16 ENCOUNTER — Telehealth: Payer: Self-pay

## 2013-04-16 NOTE — Telephone Encounter (Signed)
Would hold clonidine Monitor blood pressure If blood pressure is high, start hydralazine 25 mg 3 times a day If he continues to run high after one week, with increased to 50 mg 3 times a day Could send in a prescription for the 50 mg pills to start (cut in half)

## 2013-04-16 NOTE — Telephone Encounter (Signed)
Do we want to try hydralazine TID? See below

## 2013-04-16 NOTE — Telephone Encounter (Signed)
Pt states she is going to have to go off Clonidine states she is having swelling in her feet, and dry mouth. States "my whole body feels swollen". Please call

## 2013-04-17 NOTE — Telephone Encounter (Signed)
Left message for call back.

## 2013-04-21 ENCOUNTER — Encounter: Payer: Self-pay | Admitting: *Deleted

## 2013-04-23 ENCOUNTER — Other Ambulatory Visit: Payer: Self-pay

## 2013-04-23 NOTE — Telephone Encounter (Signed)
Pt informed She says BP has been "good" and "normal" since being off clonidine x 5 days Does not think she needs hydralazine at this time Going to see Dr. Lorin Picket this week and will have her evaluate BP Pt will keep [place] updated

## 2013-04-25 ENCOUNTER — Encounter: Payer: Self-pay | Admitting: Internal Medicine

## 2013-04-25 ENCOUNTER — Ambulatory Visit (INDEPENDENT_AMBULATORY_CARE_PROVIDER_SITE_OTHER): Payer: Medicare Other | Admitting: Internal Medicine

## 2013-04-25 VITALS — BP 130/100 | HR 70 | Temp 98.3°F | Ht 65.5 in | Wt 158.0 lb

## 2013-04-25 DIAGNOSIS — I10 Essential (primary) hypertension: Secondary | ICD-10-CM

## 2013-04-25 DIAGNOSIS — D649 Anemia, unspecified: Secondary | ICD-10-CM

## 2013-04-25 DIAGNOSIS — I4891 Unspecified atrial fibrillation: Secondary | ICD-10-CM

## 2013-04-25 DIAGNOSIS — I6529 Occlusion and stenosis of unspecified carotid artery: Secondary | ICD-10-CM

## 2013-04-25 DIAGNOSIS — D696 Thrombocytopenia, unspecified: Secondary | ICD-10-CM

## 2013-04-25 DIAGNOSIS — F17200 Nicotine dependence, unspecified, uncomplicated: Secondary | ICD-10-CM

## 2013-04-25 MED ORDER — HYDRALAZINE HCL 10 MG PO TABS: 10.0000 mg | ORAL_TABLET | Freq: Three times a day (TID) | ORAL | Status: DC

## 2013-04-27 ENCOUNTER — Encounter: Payer: Self-pay | Admitting: Internal Medicine

## 2013-04-27 NOTE — Assessment & Plan Note (Addendum)
With documented carotid stenosis.  States followed by Dr Gollan.  On coumadin.    

## 2013-04-27 NOTE — Assessment & Plan Note (Signed)
Platelet count stable (at 125) but still decreased.  Check abdominal ultrasound.

## 2013-04-27 NOTE — Assessment & Plan Note (Signed)
Heart rate controlled.  On coumadin.  Followed by Dr Gollan.   

## 2013-04-27 NOTE — Assessment & Plan Note (Signed)
Smokes <10 cigarettes per day.  Discussed the need to quit smoking.  Follow.    

## 2013-04-27 NOTE — Assessment & Plan Note (Addendum)
Follow cbc.  Hgb 04/09/13 normal.

## 2013-04-27 NOTE — Assessment & Plan Note (Addendum)
Blood pressure as outlined.  Medication regimen as outlined.  She apparently has not tolerated increasing carvedilol or chlorthalidone in the past.  Also, apparently did not tolerate amlodipine or clonidine.   Will start hydralazine 10mg  tid.   Follow pressures.  Get her back in soon to reassess.

## 2013-04-27 NOTE — Progress Notes (Signed)
Subjective:    Patient ID: [name] [name], female    DOB: 1928-04-15, 79 y.o.   MRN: [id number]  HPI 79 year old female with past history of hypertension, hypercholesterolemia, atrial fibrillation, carotid stenosis and peripheral vascular disease.  She comes in today for a scheduled follow up.  Here to follow up regarding her blood pressure.  Sees cardiology.  On coumadin for afib.  They are following her pt/inr.  Blood pressure has been elevated.  She denies any chest pain or tightness.  Breathing stable.  She has previously cut her medication doses.  She is now taking carvedilol 1/2 (25mg ) bid, chlorthalidone 25mg  1/2 tablet q day and Losartan 100mg  q day.  Not on amlodipine.  Had increased fatigue and felt washed out when on her previous medication regimen.  Last visit with me, we had discussed increasing her chlorthalidone to a whole pill per day.  After seeing Dr Mariah Milling, it was decided to add clonidine.  She did not tolerate the clonidine and went back to her regular medication regimen as outlined above.  States her blood pressure has been averaging 120s/60s with the highest reading she has recorded 138/90.  She feels good.     Past Medical History  Diagnosis Date  . Osteoarthritis   . Hypertension   . GERD (gastroesophageal reflux disease)   . Hyperlipidemia   . Cholelithiasis   . Chronic kidney disease   . Impaired fasting glucose   . Atrial fibrillation   . Carotid stenosis   . Chronic low back pain     Outpatient Encounter Prescriptions as of 04/25/2013  Medication Sig Dispense Refill  . aspirin 81 MG EC tablet Take 81 mg by mouth daily.        . carvedilol (COREG) 25 MG tablet Take 12.5 mg by mouth 2 (two) times daily with a meal.       . chlorthalidone (HYGROTON) 25 MG tablet Take 1 tablet (25 mg total) by mouth daily.  30 tablet  6  . clonazePAM (KLONOPIN) 0.5 MG tablet 1/2 - 1 tablet q day prn  20 tablet  0  . losartan (COZAAR) 100 MG tablet TAKE 1 TABLET (100 MG TOTAL) BY  MOUTH DAILY.  30 tablet  3  . omeprazole (PRILOSEC) 20 MG capsule Take 20 mg by mouth daily.      . pravastatin (PRAVACHOL) 40 MG tablet Take 1 tablet (40 mg total) by mouth daily.  90 tablet  3  . warfarin (COUMADIN) 5 MG tablet Take as directed by anticoagulation clinic  30 tablet  3  . hydrALAZINE (APRESOLINE) 10 MG tablet Take 1 tablet (10 mg total) by mouth 3 (three) times daily.  90 tablet  1   No facility-administered encounter medications on file as of 04/25/2013.    Review of Systems Patient denies any headache, lightheadedness or dizziness.  No sinus or allergy symptoms.  No chest pain, tightness or palpitations.  No increased shortness of breath, cough or congestion.  No acid reflux.  No nausea or vomiting.  No abdominal pain or cramping.  No bowel change, such as diarrhea, constipation, BRBPR or melana.  No urine change.  Does smoke - < 10 cigarettes per day.   Does not desire to quit.  Not checking her blood pressure.      Objective:   Physical Exam  Filed Vitals:   04/25/13 0918  BP: 130/100  Pulse: 70  Temp: 98.3 F (36.8 C)   Blood pressure recheck:  184/96  79  year old female in no acute distress.   HEENT:  Nares- clear.  Oropharynx - without lesions. NECK:  Supple.  Nontender.  No audible bruit.  HEART:  Appears to be regular. LUNGS:  No crackles or wheezing audible.  Respirations even and unlabored.  RADIAL PULSE:  Equal bilaterally.   ABDOMEN:  Soft, nontender.  Bowel sounds present and normal.  No audible abdominal bruit.   EXTREMITIES:  No increased edema present.  DP pulses palpable and equal bilaterally.          Assessment & Plan:  HYPERBILIRUBINEMIA.  Persistent elevation, but direct bili normal.  Decreased platelet count.  Will schedule an abdominal ultrasound.  Further w/up pending.   HEALTH MAINTENANCE.  Schedule a physical in the future.  Need records for review.

## 2013-04-30 ENCOUNTER — Ambulatory Visit: Payer: Self-pay | Admitting: Internal Medicine

## 2013-05-14 ENCOUNTER — Ambulatory Visit (INDEPENDENT_AMBULATORY_CARE_PROVIDER_SITE_OTHER): Payer: Medicare Other | Admitting: *Deleted

## 2013-05-14 DIAGNOSIS — Z7901 Long term (current) use of anticoagulants: Secondary | ICD-10-CM

## 2013-05-14 DIAGNOSIS — I4891 Unspecified atrial fibrillation: Secondary | ICD-10-CM

## 2013-05-14 LAB — POCT INR: INR: 2

## 2013-05-23 ENCOUNTER — Encounter: Payer: Self-pay | Admitting: Internal Medicine

## 2013-05-23 ENCOUNTER — Telehealth: Payer: Self-pay | Admitting: *Deleted

## 2013-05-23 ENCOUNTER — Ambulatory Visit (INDEPENDENT_AMBULATORY_CARE_PROVIDER_SITE_OTHER): Payer: Medicare Other | Admitting: Internal Medicine

## 2013-05-23 VITALS — BP 209/86 | HR 77 | Temp 98.1°F | Ht 65.5 in | Wt 157.8 lb

## 2013-05-23 DIAGNOSIS — R17 Unspecified jaundice: Secondary | ICD-10-CM

## 2013-05-23 DIAGNOSIS — E785 Hyperlipidemia, unspecified: Secondary | ICD-10-CM

## 2013-05-23 DIAGNOSIS — D649 Anemia, unspecified: Secondary | ICD-10-CM

## 2013-05-23 DIAGNOSIS — D696 Thrombocytopenia, unspecified: Secondary | ICD-10-CM

## 2013-05-23 DIAGNOSIS — I739 Peripheral vascular disease, unspecified: Secondary | ICD-10-CM

## 2013-05-23 DIAGNOSIS — I4891 Unspecified atrial fibrillation: Secondary | ICD-10-CM

## 2013-05-23 DIAGNOSIS — F17200 Nicotine dependence, unspecified, uncomplicated: Secondary | ICD-10-CM

## 2013-05-23 DIAGNOSIS — I10 Essential (primary) hypertension: Secondary | ICD-10-CM

## 2013-05-23 LAB — BASIC METABOLIC PANEL
BUN: 16 mg/dL (ref 6–23)
CO2: 26 mEq/L (ref 19–32)
Chloride: 97 mEq/L (ref 96–112)
Creatinine, Ser: 1.1 mg/dL (ref 0.4–1.2)
Glucose, Bld: 113 mg/dL — ABNORMAL HIGH (ref 70–99)
Potassium: 4.6 mEq/L (ref 3.5–5.1)

## 2013-05-23 LAB — CBC WITH DIFFERENTIAL/PLATELET
Basophils Relative: 0.5 % (ref 0.0–3.0)
Eosinophils Absolute: 0.2 10*3/uL (ref 0.0–0.7)
Eosinophils Relative: 4.8 % (ref 0.0–5.0)
Hemoglobin: 12.5 g/dL (ref 12.0–15.0)
Lymphocytes Relative: 24.2 % (ref 12.0–46.0)
Monocytes Relative: 10 % (ref 3.0–12.0)
Neutro Abs: 2.7 10*3/uL (ref 1.4–7.7)
Neutrophils Relative %: 60.5 % (ref 43.0–77.0)
RBC: 3.61 Mil/uL — ABNORMAL LOW (ref 3.87–5.11)
WBC: 4.5 10*3/uL (ref 4.5–10.5)

## 2013-05-23 LAB — HEPATIC FUNCTION PANEL
ALT: 20 U/L (ref 0–35)
AST: 21 U/L (ref 0–37)
Total Bilirubin: 1.6 mg/dL — ABNORMAL HIGH (ref 0.3–1.2)
Total Protein: 7.2 g/dL (ref 6.0–8.3)

## 2013-05-23 MED ORDER — CLONAZEPAM 0.5 MG PO TABS: ORAL_TABLET | ORAL | Status: DC

## 2013-05-23 NOTE — Telephone Encounter (Signed)
Pt dropped off BP reading from today at 1pm (132/65)

## 2013-05-25 ENCOUNTER — Encounter: Payer: Self-pay | Admitting: Internal Medicine

## 2013-05-25 NOTE — Assessment & Plan Note (Signed)
Persistent elevation in her blood pressure here.  Her cuff appears to correlate relatively well with my check here.  Her blood pressures (per her report) at home are under much better control.  Was started on hydralazine last visit.  Only taking it two times per day.  Discussed with her regarding trying to increase the hydralazine to tid. She has not tolerated increasing the doses of her other medications and did not tolerate the addition of clonidine.   In reviewing her abdominal ultrasound, her right kidney appeared to be smaller than the left.  She also has what sounds like an abdominal bruit.  Given the above findings on ultrasound associated with the fluctuation of her blood pressures and intolerance to adjustments in her medication, will refer to nephrology for help with her blood pressure and further w/up regarding possible renal artery stenosis.

## 2013-05-25 NOTE — Assessment & Plan Note (Signed)
Heart rate controlled.  On coumadin.  Followed by Dr Gollan.   

## 2013-05-25 NOTE — Progress Notes (Signed)
Subjective:    Patient ID: [name] [name], female    DOB: 1928-07-02, 79 y.o.   MRN: [id number]  HPI 79 year old female with past history of hypertension, hypercholesterolemia, atrial fibrillation, carotid stenosis and peripheral vascular disease.  She comes in today for a scheduled follow up.  Here to follow up regarding her blood pressure.  Sees cardiology.  On coumadin for afib.  They are following her pt/inr.  Blood pressure has been elevated.  She denies any chest pain or tightness.  Breathing stable.  She has previously cut her medication doses.  She is now taking carvedilol 1/2 (25mg ) bid, chlorthalidone 25mg  1/2 tablet q day and Losartan 100mg  q day.  Not on amlodipine.  Had increased fatigue and felt washed out when on her previous medication regimen.  Apparently also did not tolerate increasing chlorthalidone.  She also did not tolerate clonidine.  States her blood pressure has been averaging 120-130s/60s on her home machine.  She feels good.  Stays active.     Past Medical History  Diagnosis Date  . Osteoarthritis   . Hypertension   . GERD (gastroesophageal reflux disease)   . Hyperlipidemia   . Cholelithiasis   . Chronic kidney disease   . Impaired fasting glucose   . Atrial fibrillation   . Carotid stenosis   . Chronic low back pain     Outpatient Encounter Prescriptions as of 05/23/2013  Medication Sig Dispense Refill  . aspirin 81 MG EC tablet Take 81 mg by mouth daily.        . carvedilol (COREG) 25 MG tablet Take 12.5 mg by mouth 2 (two) times daily with a meal.       . chlorthalidone (HYGROTON) 25 MG tablet Take 1 tablet (25 mg total) by mouth daily.  30 tablet  6  . clonazePAM (KLONOPIN) 0.5 MG tablet 1/2 - 1 tablet q day prn  20 tablet  0  . hydrALAZINE (APRESOLINE) 10 MG tablet Take 1 tablet (10 mg total) by mouth 3 (three) times daily.  90 tablet  1  . losartan (COZAAR) 100 MG tablet TAKE 1 TABLET (100 MG TOTAL) BY MOUTH DAILY.  30 tablet  3  . omeprazole  (PRILOSEC) 20 MG capsule Take 20 mg by mouth daily.      . pravastatin (PRAVACHOL) 40 MG tablet Take 1 tablet (40 mg total) by mouth daily.  90 tablet  3  . warfarin (COUMADIN) 5 MG tablet Take as directed by anticoagulation clinic  30 tablet  3  . [DISCONTINUED] clonazePAM (KLONOPIN) 0.5 MG tablet 1/2 - 1 tablet q day prn  20 tablet  0   No facility-administered encounter medications on file as of 05/23/2013.    Review of Systems Patient denies any headache, lightheadedness or dizziness.  No sinus or allergy symptoms.  No chest pain, tightness or palpitations.  No increased shortness of breath, cough or congestion.  No acid reflux.  No nausea or vomiting.  No abdominal pain or cramping.  No bowel change, such as diarrhea, constipation, BRBPR or melana.  No urine change.  Does smoke - < 10 cigarettes per day.   Does not desire to quit.  Blood pressures on outside checks under good control.       Objective:   Physical Exam  Filed Vitals:   05/23/13 0908  BP: 209/86  Pulse: 77  Temp:    Blood pressure recheck:  51/85-27  79 year old female in no acute distress.   HEENT:  Nares- clear.  Oropharynx - without lesions. NECK:  Supple.  Nontender.   HEART:  Appears to be regular. LUNGS:  No crackles or wheezing audible.  Respirations even and unlabored.  RADIAL PULSE:  Equal bilaterally.   ABDOMEN:  Soft, nontender.  Bowel sounds present and normal.  No audible abdominal bruit.   EXTREMITIES:  No increased edema present.  DP pulses palpable and equal bilaterally.          Assessment & Plan:  HYPERBILIRUBINEMIA.  Persistent elevation, but direct bili normal.  Decreased platelet count.  Recent abdominal ultrasound revealed an hypoechoic cystic mass along the left lobe of the liver - felt to be left lobe hepatic cyst and cholelithiasis.  No abdominal pain, nausea or vomiting.  Eating and drinking well.  Will need to follow.    HEALTH MAINTENANCE.  Schedule a physical in the future.  Need  records for review.

## 2013-05-25 NOTE — Assessment & Plan Note (Signed)
Smokes <10 cigarettes per day.  Have discussed the need to quit smoking.  Follow.  

## 2013-05-25 NOTE — Assessment & Plan Note (Signed)
Platelet count stable (at 125) but still decreased.  Abdominal ultrasound reviewed.  No mention of splenomegaly.

## 2013-05-25 NOTE — Assessment & Plan Note (Signed)
On pravastatin.  Low cholesterol diet and exercise.  Follow lipid panel and liver function.  Lipid panel 03/20/13 revealed total cholesterol 131, triglycerides 62, HDL 54 and LDL 65.        

## 2013-05-25 NOTE — Assessment & Plan Note (Signed)
With documented carotid stenosis.  States followed by Dr Gollan.  On coumadin.    

## 2013-05-25 NOTE — Assessment & Plan Note (Signed)
Legs doing better - per pt.  Is followed by cardiology for her carotid disease.   

## 2013-05-25 NOTE — Assessment & Plan Note (Signed)
Follow cbc.  Hgb 04/09/13 normal.     

## 2013-05-26 ENCOUNTER — Other Ambulatory Visit: Payer: Self-pay | Admitting: Internal Medicine

## 2013-05-26 DIAGNOSIS — E871 Hypo-osmolality and hyponatremia: Secondary | ICD-10-CM

## 2013-05-26 NOTE — Progress Notes (Signed)
Order placed for f/u sodium.  ?

## 2013-05-29 ENCOUNTER — Other Ambulatory Visit (INDEPENDENT_AMBULATORY_CARE_PROVIDER_SITE_OTHER): Payer: Medicare Other

## 2013-05-29 DIAGNOSIS — E871 Hypo-osmolality and hyponatremia: Secondary | ICD-10-CM

## 2013-05-29 LAB — SODIUM: Sodium: 128 mEq/L — ABNORMAL LOW (ref 135–145)

## 2013-05-30 ENCOUNTER — Other Ambulatory Visit: Payer: Self-pay | Admitting: Internal Medicine

## 2013-05-30 DIAGNOSIS — E871 Hypo-osmolality and hyponatremia: Secondary | ICD-10-CM

## 2013-05-30 NOTE — Progress Notes (Signed)
Ordered a f/u met b

## 2013-06-02 ENCOUNTER — Other Ambulatory Visit (INDEPENDENT_AMBULATORY_CARE_PROVIDER_SITE_OTHER): Payer: Medicare Other

## 2013-06-02 DIAGNOSIS — E871 Hypo-osmolality and hyponatremia: Secondary | ICD-10-CM

## 2013-06-02 LAB — BASIC METABOLIC PANEL
CO2: 26 mEq/L (ref 19–32)
Calcium: 10.1 mg/dL (ref 8.4–10.5)
Creatinine, Ser: 1.2 mg/dL (ref 0.4–1.2)
GFR: 46.99 mL/min — ABNORMAL LOW (ref >60.00)
Sodium: 132 mEq/L — ABNORMAL LOW (ref 135–145)

## 2013-06-03 ENCOUNTER — Other Ambulatory Visit: Payer: Self-pay | Admitting: Internal Medicine

## 2013-06-03 DIAGNOSIS — N289 Disorder of kidney and ureter, unspecified: Secondary | ICD-10-CM

## 2013-06-03 NOTE — Progress Notes (Signed)
Order placed for f/u met b.  

## 2013-06-09 ENCOUNTER — Other Ambulatory Visit: Payer: Self-pay

## 2013-06-09 MED ORDER — LOSARTAN POTASSIUM 100 MG PO TABS: ORAL_TABLET | ORAL | Status: DC

## 2013-06-09 NOTE — Telephone Encounter (Signed)
Refilled Losartan sent to [facility].

## 2013-06-17 ENCOUNTER — Other Ambulatory Visit: Payer: Self-pay | Admitting: *Deleted

## 2013-06-17 MED ORDER — CARVEDILOL 25 MG PO TABS: 12.5000 mg | ORAL_TABLET | Freq: Two times a day (BID) | ORAL | Status: DC

## 2013-06-18 ENCOUNTER — Other Ambulatory Visit: Payer: Self-pay | Admitting: *Deleted

## 2013-06-18 MED ORDER — CARVEDILOL 25 MG PO TABS: 12.5000 mg | ORAL_TABLET | Freq: Two times a day (BID) | ORAL | Status: DC

## 2013-06-18 MED ORDER — LOSARTAN POTASSIUM 100 MG PO TABS: ORAL_TABLET | ORAL | Status: DC

## 2013-06-18 MED ORDER — PRAVASTATIN SODIUM 40 MG PO TABS: 40.0000 mg | ORAL_TABLET | Freq: Every day | ORAL | Status: DC

## 2013-06-18 MED ORDER — CHLORTHALIDONE 25 MG PO TABS: 25.0000 mg | ORAL_TABLET | Freq: Every day | ORAL | Status: DC

## 2013-06-18 MED ORDER — HYDRALAZINE HCL 10 MG PO TABS: 10.0000 mg | ORAL_TABLET | Freq: Three times a day (TID) | ORAL | Status: DC

## 2013-06-18 NOTE — Telephone Encounter (Signed)
Refilled 90 day supply losartan, carvedilol, chlorthialadone and pravastatin.

## 2013-06-19 ENCOUNTER — Other Ambulatory Visit: Payer: Self-pay | Admitting: *Deleted

## 2013-06-19 MED ORDER — WARFARIN SODIUM 5 MG PO TABS: ORAL_TABLET | ORAL | Status: DC

## 2013-06-25 ENCOUNTER — Ambulatory Visit (INDEPENDENT_AMBULATORY_CARE_PROVIDER_SITE_OTHER): Payer: Medicare Other

## 2013-06-25 DIAGNOSIS — I4891 Unspecified atrial fibrillation: Secondary | ICD-10-CM

## 2013-06-25 DIAGNOSIS — Z7901 Long term (current) use of anticoagulants: Secondary | ICD-10-CM

## 2013-06-27 ENCOUNTER — Ambulatory Visit (INDEPENDENT_AMBULATORY_CARE_PROVIDER_SITE_OTHER): Payer: Medicare Other | Admitting: Internal Medicine

## 2013-06-27 ENCOUNTER — Encounter: Payer: Self-pay | Admitting: Internal Medicine

## 2013-06-27 VITALS — BP 194/90 | HR 82 | Temp 98.0°F | Ht 65.5 in | Wt 159.5 lb

## 2013-06-27 DIAGNOSIS — I6529 Occlusion and stenosis of unspecified carotid artery: Secondary | ICD-10-CM

## 2013-06-27 DIAGNOSIS — D649 Anemia, unspecified: Secondary | ICD-10-CM

## 2013-06-27 DIAGNOSIS — I4891 Unspecified atrial fibrillation: Secondary | ICD-10-CM

## 2013-06-27 DIAGNOSIS — F17200 Nicotine dependence, unspecified, uncomplicated: Secondary | ICD-10-CM

## 2013-06-27 DIAGNOSIS — D696 Thrombocytopenia, unspecified: Secondary | ICD-10-CM

## 2013-06-27 DIAGNOSIS — I739 Peripheral vascular disease, unspecified: Secondary | ICD-10-CM

## 2013-06-27 DIAGNOSIS — I10 Essential (primary) hypertension: Secondary | ICD-10-CM

## 2013-06-27 DIAGNOSIS — E785 Hyperlipidemia, unspecified: Secondary | ICD-10-CM

## 2013-06-27 DIAGNOSIS — R739 Hyperglycemia, unspecified: Secondary | ICD-10-CM

## 2013-06-27 DIAGNOSIS — R7309 Other abnormal glucose: Secondary | ICD-10-CM

## 2013-06-29 ENCOUNTER — Telehealth: Payer: Self-pay | Admitting: Internal Medicine

## 2013-06-29 ENCOUNTER — Encounter: Payer: Self-pay | Admitting: Internal Medicine

## 2013-06-29 NOTE — Assessment & Plan Note (Signed)
Follow cbc.  Hgb 04/09/13 normal.

## 2013-06-29 NOTE — Assessment & Plan Note (Signed)
With documented carotid stenosis.  States followed by Dr Gollan.  On coumadin.    

## 2013-06-29 NOTE — Assessment & Plan Note (Signed)
Persistent elevation in her blood pressure here.  Her cuff appears to correlate relatively well with my check here.  Her blood pressures (per her report) at home are under much better control.  Hydralazine added.  She is now taking it tid.  Tolerating.  She has not tolerated increasing the doses of her other medications and did not tolerate the addition of clonidine.   In reviewing her abdominal ultrasound, her right kidney appeared to be smaller than the left.  She also has what sounds like an abdominal bruit.  Given the above findings on ultrasound associated with the fluctuation of her blood pressures and intolerance to adjustments in her medication, referred her to nephrology for help with her blood pressure and further w/up regarding possible renal artery stenosis.  She canceled her appt.  Discussed with her today regarding further w/up.  She declines.  Will notify me if she changes her mind.

## 2013-06-29 NOTE — Assessment & Plan Note (Signed)
Legs doing better - per pt.  Is followed by cardiology for her carotid disease.   

## 2013-06-29 NOTE — Assessment & Plan Note (Signed)
On pravastatin.  Low cholesterol diet and exercise.  Follow lipid panel and liver function.  Lipid panel 03/20/13 revealed total cholesterol 131, triglycerides 62, HDL 54 and LDL 65.

## 2013-06-29 NOTE — Progress Notes (Signed)
Subjective:    Patient ID: [name] [name], female    DOB: [birth year], 79 y.o.   MRN: [id number]  HPI 79 year old female with past history of hypertension, hypercholesterolemia, atrial fibrillation, carotid stenosis and peripheral vascular disease.  She comes in today for a scheduled follow up.  Here to follow up regarding her blood pressure.  Sees cardiology.  On coumadin for afib.  They are following her pt/inr.  Blood pressure has been elevated here in the office, but her outside checks looks good.  I had her bring her blood pressure cuff last visit, and her cuff seems to correlate with our readings.  States her pressure has a history of her pressure running higher in the office.   She denies any chest pain or tightness.  Breathing stable.  She has adjusts her medication doses on her own.   She is now taking carvedilol 1 (25mg ) bid, chlorthalidone 25mg  1/2 tablet q day and Losartan 100mg  q day.  Not on amlodipine.  Had increased fatigue and felt washed out when on her previous medication regimen.  Apparently also did not tolerate increasing chlorthalidone.  She also did not tolerate clonidine.  States her blood pressure has been averaging 120-130s/60s on her home machine.  Her highest reading has been 138 systolic.  She feels good.  Stays active.     Past Medical History  Diagnosis Date  . Osteoarthritis   . Hypertension   . GERD (gastroesophageal reflux disease)   . Hyperlipidemia   . Cholelithiasis   . Chronic kidney disease   . Impaired fasting glucose   . Atrial fibrillation   . Carotid stenosis   . Chronic low back pain     Outpatient Encounter Prescriptions as of 06/27/2013  Medication Sig Dispense Refill  . aspirin 81 MG EC tablet Take 81 mg by mouth daily.        . carvedilol (COREG) 25 MG tablet Take 0.5 tablets (12.5 mg total) by mouth 2 (two) times daily with a meal.  90 tablet  3  . chlorthalidone (HYGROTON) 25 MG tablet Take 1 tablet (25 mg total) by mouth daily.  90  tablet  3  . clonazePAM (KLONOPIN) 0.5 MG tablet 1/2 - 1 tablet q day prn  20 tablet  0  . hydrALAZINE (APRESOLINE) 10 MG tablet Take 1 tablet (10 mg total) by mouth 3 (three) times daily.  90 tablet  1  . losartan (COZAAR) 100 MG tablet TAKE 1 TABLET (100 MG TOTAL) BY MOUTH DAILY.  90 tablet  3  . omeprazole (PRILOSEC) 20 MG capsule Take 20 mg by mouth daily.      . pravastatin (PRAVACHOL) 40 MG tablet Take 1 tablet (40 mg total) by mouth daily.  90 tablet  3  . warfarin (COUMADIN) 5 MG tablet Take as directed by anticoagulation clinic  30 tablet  3   No facility-administered encounter medications on file as of 06/27/2013.    Review of Systems Patient denies any headache, lightheadedness or dizziness.  No sinus or allergy symptoms.  No chest pain, tightness or palpitations.  No increased shortness of breath, cough or congestion.  No acid reflux.  No nausea or vomiting.  No abdominal pain or cramping.  No bowel change, such as diarrhea, constipation, BRBPR or melana.  No urine change.  Does smoke - < 10 cigarettes per day.   Does not desire to quit.  Blood pressures on outside checks under good control.       Objective:  Physical Exam  Filed Vitals:   06/27/13 1012  BP: 194/90  Pulse: 82  Temp: 98 F (36.7 C)   Blood pressure recheck:  50/83  79 year old female in no acute distress.   HEENT:  Nares- clear.  Oropharynx - without lesions. NECK:  Supple.  Nontender.   HEART:  Appears to be regular. LUNGS:  No crackles or wheezing audible.  Respirations even and unlabored.  RADIAL PULSE:  Equal bilaterally.   ABDOMEN:  Soft, nontender.  Bowel sounds present and normal.  No audible abdominal bruit.   EXTREMITIES:  No increased edema present.  DP pulses palpable and equal bilaterally.          Assessment & Plan:  HYPERBILIRUBINEMIA.  Persistent elevation, but direct bili normal.  Decreased platelet count.  Recent abdominal ultrasound revealed an hypoechoic cystic mass along the left  lobe of the liver - felt to be left lobe hepatic cyst and cholelithiasis.  No abdominal pain, nausea or vomiting.  Eating and drinking well.  Will need to follow.    HEALTH MAINTENANCE.  Schedule a physical in the future.

## 2013-06-29 NOTE — Telephone Encounter (Signed)
Notify pt that she needs fasting labs within the next 1-2 weeks.

## 2013-06-29 NOTE — Assessment & Plan Note (Signed)
Heart rate controlled.  On coumadin.  Followed by Dr Gollan.   

## 2013-06-29 NOTE — Assessment & Plan Note (Signed)
Smokes <10 cigarettes per day.  Have discussed the need to quit smoking.  Follow.  

## 2013-06-29 NOTE — Assessment & Plan Note (Addendum)
Platelet count stable (at 135) but still decreased.  Abdominal ultrasound reviewed.  No mention of splenomegaly.  Follow.     

## 2013-07-01 NOTE — Telephone Encounter (Signed)
Appointment 8/26 Pt aware 

## 2013-07-08 ENCOUNTER — Other Ambulatory Visit (INDEPENDENT_AMBULATORY_CARE_PROVIDER_SITE_OTHER): Payer: Medicare Other

## 2013-07-08 DIAGNOSIS — N289 Disorder of kidney and ureter, unspecified: Secondary | ICD-10-CM

## 2013-07-08 DIAGNOSIS — R739 Hyperglycemia, unspecified: Secondary | ICD-10-CM

## 2013-07-08 DIAGNOSIS — R7309 Other abnormal glucose: Secondary | ICD-10-CM

## 2013-07-08 LAB — BASIC METABOLIC PANEL
CO2: 27 mEq/L (ref 19–32)
GFR: 55.66 mL/min — ABNORMAL LOW (ref >60.00)
Glucose, Bld: 108 mg/dL — ABNORMAL HIGH (ref 70–99)
Potassium: 4.2 mEq/L (ref 3.5–5.1)
Sodium: 129 mEq/L — ABNORMAL LOW (ref 135–145)

## 2013-07-09 ENCOUNTER — Other Ambulatory Visit: Payer: Self-pay | Admitting: Internal Medicine

## 2013-07-09 ENCOUNTER — Encounter: Payer: Self-pay | Admitting: Internal Medicine

## 2013-07-09 DIAGNOSIS — E871 Hypo-osmolality and hyponatremia: Secondary | ICD-10-CM

## 2013-07-09 NOTE — Progress Notes (Signed)
Order placed for f/u sodium.  ?

## 2013-07-11 ENCOUNTER — Other Ambulatory Visit (INDEPENDENT_AMBULATORY_CARE_PROVIDER_SITE_OTHER): Payer: Medicare Other

## 2013-07-11 DIAGNOSIS — E871 Hypo-osmolality and hyponatremia: Secondary | ICD-10-CM

## 2013-07-15 ENCOUNTER — Other Ambulatory Visit: Payer: Self-pay | Admitting: Cardiovascular Disease

## 2013-07-15 NOTE — Telephone Encounter (Signed)
Please review for refill.  

## 2013-08-06 ENCOUNTER — Ambulatory Visit (INDEPENDENT_AMBULATORY_CARE_PROVIDER_SITE_OTHER): Payer: Medicare Other

## 2013-08-06 DIAGNOSIS — I4891 Unspecified atrial fibrillation: Secondary | ICD-10-CM

## 2013-08-06 DIAGNOSIS — Z7901 Long term (current) use of anticoagulants: Secondary | ICD-10-CM

## 2013-08-19 ENCOUNTER — Telehealth: Payer: Self-pay | Admitting: Internal Medicine

## 2013-08-19 NOTE — Telephone Encounter (Signed)
Pt states at her appt 11/18 she would like to also get the flu shot.  Pt also would like to get her pneumonia shot, states it has been over 10 years.

## 2013-08-19 NOTE — Telephone Encounter (Signed)
noted 

## 2013-09-03 ENCOUNTER — Ambulatory Visit (INDEPENDENT_AMBULATORY_CARE_PROVIDER_SITE_OTHER): Payer: Medicare Other | Admitting: General Practice

## 2013-09-03 DIAGNOSIS — Z7901 Long term (current) use of anticoagulants: Secondary | ICD-10-CM

## 2013-09-03 DIAGNOSIS — I4891 Unspecified atrial fibrillation: Secondary | ICD-10-CM

## 2013-09-03 LAB — POCT INR: INR: 2.2

## 2013-09-30 ENCOUNTER — Ambulatory Visit (INDEPENDENT_AMBULATORY_CARE_PROVIDER_SITE_OTHER): Payer: Medicare Other | Admitting: Internal Medicine

## 2013-09-30 ENCOUNTER — Encounter: Payer: Self-pay | Admitting: Internal Medicine

## 2013-09-30 VITALS — BP 130/90 | HR 94 | Temp 98.0°F | Ht 65.5 in | Wt 161.2 lb

## 2013-09-30 DIAGNOSIS — R739 Hyperglycemia, unspecified: Secondary | ICD-10-CM

## 2013-09-30 DIAGNOSIS — D696 Thrombocytopenia, unspecified: Secondary | ICD-10-CM

## 2013-09-30 DIAGNOSIS — R7309 Other abnormal glucose: Secondary | ICD-10-CM

## 2013-09-30 DIAGNOSIS — Z23 Encounter for immunization: Secondary | ICD-10-CM

## 2013-09-30 DIAGNOSIS — E785 Hyperlipidemia, unspecified: Secondary | ICD-10-CM

## 2013-09-30 DIAGNOSIS — I739 Peripheral vascular disease, unspecified: Secondary | ICD-10-CM

## 2013-09-30 DIAGNOSIS — F17200 Nicotine dependence, unspecified, uncomplicated: Secondary | ICD-10-CM

## 2013-09-30 DIAGNOSIS — I10 Essential (primary) hypertension: Secondary | ICD-10-CM

## 2013-09-30 DIAGNOSIS — D649 Anemia, unspecified: Secondary | ICD-10-CM

## 2013-09-30 DIAGNOSIS — K7689 Other specified diseases of liver: Secondary | ICD-10-CM

## 2013-09-30 DIAGNOSIS — I6529 Occlusion and stenosis of unspecified carotid artery: Secondary | ICD-10-CM

## 2013-09-30 DIAGNOSIS — I4891 Unspecified atrial fibrillation: Secondary | ICD-10-CM

## 2013-09-30 LAB — HEMOGLOBIN A1C: Hgb A1c MFr Bld: 5.7 % (ref 4.6–6.5)

## 2013-09-30 LAB — CBC WITH DIFFERENTIAL/PLATELET
Basophils Relative: 0.7 % (ref 0.0–3.0)
Eosinophils Absolute: 0.1 10*3/uL (ref 0.0–0.7)
Eosinophils Relative: 2.6 % (ref 0.0–5.0)
Hemoglobin: 11.9 g/dL — ABNORMAL LOW (ref 12.0–15.0)
MCHC: 34.6 g/dL (ref 30.0–36.0)
MCV: 98.4 fl (ref 78.0–100.0)
Monocytes Absolute: 0.5 10*3/uL (ref 0.1–1.0)
Neutro Abs: 2.8 10*3/uL (ref 1.4–7.7)
RBC: 3.51 Mil/uL — ABNORMAL LOW (ref 3.87–5.11)

## 2013-09-30 LAB — HEPATIC FUNCTION PANEL
AST: 20 U/L (ref 0–37)
Alkaline Phosphatase: 47 U/L (ref 39–117)
Total Bilirubin: 1.4 mg/dL — ABNORMAL HIGH (ref 0.3–1.2)

## 2013-09-30 LAB — BASIC METABOLIC PANEL
CO2: 27 mEq/L (ref 19–32)
Chloride: 101 mEq/L (ref 96–112)
Potassium: 4.4 mEq/L (ref 3.5–5.1)
Sodium: 134 mEq/L — ABNORMAL LOW (ref 135–145)

## 2013-09-30 LAB — LIPID PANEL
HDL: 57.9 mg/dL (ref >39.00)
LDL Cholesterol: 70 mg/dL (ref 0–99)
Total CHOL/HDL Ratio: 2

## 2013-09-30 MED ORDER — HYDRALAZINE HCL 10 MG PO TABS: 10.0000 mg | ORAL_TABLET | Freq: Three times a day (TID) | ORAL | Status: DC

## 2013-09-30 MED ORDER — CLONAZEPAM 0.5 MG PO TABS: ORAL_TABLET | ORAL | Status: DC

## 2013-09-30 MED ORDER — LOSARTAN POTASSIUM 100 MG PO TABS: ORAL_TABLET | ORAL | Status: DC

## 2013-09-30 NOTE — Assessment & Plan Note (Addendum)
Smokes <10 cigarettes per day.  Have discussed the need to quit smoking.  Follow.  

## 2013-09-30 NOTE — Progress Notes (Signed)
Pre-visit discussion using our clinic review tool. No additional management support is needed unless otherwise documented below in the visit note.  

## 2013-09-30 NOTE — Assessment & Plan Note (Addendum)
Follow cbc.  Hgb 04/09/13 normal.

## 2013-09-30 NOTE — Assessment & Plan Note (Addendum)
Persistent elevation in her blood pressure here.  Her cuff appears to correlate relatively well with my check here.  Her blood pressures (per her report) at home are under much better control.  Hydralazine added.  She is now taking it tid.  Tolerating.  She has not tolerated increasing the doses of her other medications and did not tolerate the addition of clonidine.   In reviewing her abdominal ultrasound, her right kidney appeared to be smaller than the left.  She also has what sounds like an abdominal bruit.  Given the above findings on ultrasound associated with the fluctuation of her blood pressures and intolerance to adjustments in her medication, referred her to nephrology for help with her blood pressure and further w/up regarding possible renal artery stenosis.  She canceled her appt.  Will notify me if she changes her mind.  Blood pressure doing better.  Follow.  Check metabolic panel.

## 2013-09-30 NOTE — Progress Notes (Signed)
Subjective:    Patient ID: [name] [name], female    DOB: 1928-02-06, 79 y.o.   MRN: [id number]  HPI 79 year old female with past history of hypertension, hypercholesterolemia, atrial fibrillation, carotid stenosis and peripheral vascular disease.  She comes in today for a scheduled follow up.  Here to follow up regarding her blood pressure.  Sees cardiology.  On coumadin for afib.  They are following her pt/inr.  Blood pressure has been elevated here in the office, but her outside checks look good.  I had her bring her blood pressure cuff to a previous visit, and her cuff seems to correlate with our readings.  States she has a history of her pressure running higher in the office.   She denies any chest pain or tightness.  Breathing stable.   She is now taking carvedilol 1 (25mg ) bid, chlorthalidone 25mg  1/2 tablet q day and Losartan 100mg  q day.  Not on amlodipine.  Had increased fatigue and felt washed out when on her previous medication regimen.  Apparently also did not tolerate increasing chlorthalidone.  She also did not tolerate clonidine.  States her blood pressure has been averaging 130s/70s on her home machine.  She feels good.  Stays active.     Past Medical History  Diagnosis Date  . Osteoarthritis   . Hypertension   . GERD (gastroesophageal reflux disease)   . Hyperlipidemia   . Cholelithiasis   . Chronic kidney disease   . Impaired fasting glucose   . Atrial fibrillation   . Carotid stenosis   . Chronic low back pain     Outpatient Encounter Prescriptions as of 09/30/2013  Medication Sig  . aspirin 81 MG EC tablet Take 81 mg by mouth daily.    . carvedilol (COREG) 25 MG tablet Take 0.5 tablets (12.5 mg total) by mouth 2 (two) times daily with a meal.  . chlorthalidone (HYGROTON) 25 MG tablet Take 1 tablet (25 mg total) by mouth daily.  . clonazePAM (KLONOPIN) 0.5 MG tablet 1/2 - 1 tablet q day prn  . hydrALAZINE (APRESOLINE) 10 MG tablet Take 1 tablet (10 mg total) by  mouth 3 (three) times daily.  Marland Kitchen losartan (COZAAR) 100 MG tablet TAKE 1 TABLET (100 MG TOTAL) BY MOUTH DAILY.  Marland Kitchen omeprazole (PRILOSEC) 20 MG capsule Take 20 mg by mouth daily.  . pravastatin (PRAVACHOL) 40 MG tablet Take 1 tablet (40 mg total) by mouth daily.  Marland Kitchen warfarin (COUMADIN) 5 MG tablet Take as directed by anticoagulation clinic    Review of Systems Patient denies any headache, lightheadedness or dizziness.  No sinus or allergy symptoms.  No chest pain, tightness or palpitations.  No increased shortness of breath, cough or congestion.  No acid reflux.  No nausea or vomiting.  No abdominal pain or cramping.  No bowel change, such as diarrhea, constipation, BRBPR or melana.  No urine change.  Does smoke - < 10 cigarettes per day.   Does not desire to quit at this time, but has cut down the amount she smokes.  Blood pressures on outside checks under good control.       Objective:   Physical Exam  Filed Vitals:   09/30/13 0857  BP: 130/90  Pulse: 94  Temp: 98 F (36.7 C)   Blood pressure recheck:  46-66/59  79 year old female in no acute distress.   HEENT:  Nares- clear.  Oropharynx - without lesions. NECK:  Supple.  Nontender.   HEART:  Appears to be  regular. LUNGS:  No crackles or wheezing audible.  Respirations even and unlabored.  RADIAL PULSE:  Equal bilaterally.   ABDOMEN:  Soft, nontender.  Bowel sounds present and normal.  No audible abdominal bruit.   EXTREMITIES:  No increased edema present.  DP pulses palpable and equal bilaterally.          Assessment & Plan:  HYPERBILIRUBINEMIA.  Persistent elevation, but direct bili normal.  Decreased platelet count.  Recent abdominal ultrasound revealed an hypoechoic cystic mass along the left lobe of the liver - felt to be left lobe hepatic cyst and cholelithiasis.  Discussed with her regarding further w/up.  She declines at this time.  No abdominal pain, nausea or vomiting.  Eating and drinking well.  Will need to follow.     HEALTH MAINTENANCE.  Schedule a physical in the future.

## 2013-09-30 NOTE — Assessment & Plan Note (Signed)
Platelet count stable (at 135) but still decreased.  Abdominal ultrasound reviewed.  No mention of splenomegaly.  Follow.     

## 2013-09-30 NOTE — Assessment & Plan Note (Signed)
Heart rate controlled.  On coumadin.  Followed by Dr Gollan.   

## 2013-09-30 NOTE — Assessment & Plan Note (Addendum)
On pravastatin.  Low cholesterol diet and exercise.  Follow lipid panel and liver function.  Lipid panel 03/20/13 revealed total cholesterol 131, triglycerides 62, HDL 54 and LDL 65.  Will recheck today.

## 2013-09-30 NOTE — Assessment & Plan Note (Addendum)
Legs doing better - per pt.  Is followed by cardiology for her carotid disease.   

## 2013-10-01 ENCOUNTER — Ambulatory Visit (INDEPENDENT_AMBULATORY_CARE_PROVIDER_SITE_OTHER): Payer: Medicare Other | Admitting: General Practice

## 2013-10-01 ENCOUNTER — Encounter: Payer: Self-pay | Admitting: *Deleted

## 2013-10-01 ENCOUNTER — Encounter: Payer: Self-pay | Admitting: Internal Medicine

## 2013-10-01 ENCOUNTER — Other Ambulatory Visit: Payer: Self-pay | Admitting: Internal Medicine

## 2013-10-01 DIAGNOSIS — D696 Thrombocytopenia, unspecified: Secondary | ICD-10-CM

## 2013-10-01 DIAGNOSIS — K7689 Other specified diseases of liver: Secondary | ICD-10-CM | POA: Insufficient documentation

## 2013-10-01 DIAGNOSIS — I4891 Unspecified atrial fibrillation: Secondary | ICD-10-CM

## 2013-10-01 DIAGNOSIS — Z7901 Long term (current) use of anticoagulants: Secondary | ICD-10-CM

## 2013-10-01 NOTE — Assessment & Plan Note (Signed)
With documented carotid stenosis.  States followed by Dr Gollan.  On coumadin.    

## 2013-10-01 NOTE — Progress Notes (Signed)
Order for f/u cbc.

## 2013-10-01 NOTE — Assessment & Plan Note (Signed)
Found on abdominal ultrasound.  Discussed further w/up.  She declined at this time.  Will call when ready.     

## 2013-11-12 ENCOUNTER — Ambulatory Visit (INDEPENDENT_AMBULATORY_CARE_PROVIDER_SITE_OTHER): Payer: Medicare Other

## 2013-11-12 DIAGNOSIS — I4891 Unspecified atrial fibrillation: Secondary | ICD-10-CM

## 2013-11-12 DIAGNOSIS — Z7901 Long term (current) use of anticoagulants: Secondary | ICD-10-CM

## 2013-11-12 LAB — POCT INR: INR: 3.1

## 2013-11-14 ENCOUNTER — Other Ambulatory Visit: Payer: Medicare Other

## 2013-11-20 ENCOUNTER — Other Ambulatory Visit (INDEPENDENT_AMBULATORY_CARE_PROVIDER_SITE_OTHER): Payer: Medicare Other

## 2013-11-20 ENCOUNTER — Encounter (INDEPENDENT_AMBULATORY_CARE_PROVIDER_SITE_OTHER): Payer: Self-pay

## 2013-11-20 DIAGNOSIS — D696 Thrombocytopenia, unspecified: Secondary | ICD-10-CM

## 2013-11-20 LAB — CBC WITH DIFFERENTIAL/PLATELET
Basophils Absolute: 0 10*3/uL (ref 0.0–0.1)
Basophils Relative: 0.4 % (ref 0.0–3.0)
EOS ABS: 0.1 10*3/uL (ref 0.0–0.7)
Eosinophils Relative: 3.1 % (ref 0.0–5.0)
HCT: 32.5 % — ABNORMAL LOW (ref 36.0–46.0)
Hemoglobin: 11.4 g/dL — ABNORMAL LOW (ref 12.0–15.0)
LYMPHS PCT: 27.8 % (ref 12.0–46.0)
Lymphs Abs: 1.2 10*3/uL (ref 0.7–4.0)
MCHC: 35 g/dL (ref 30.0–36.0)
MCV: 97.3 fl (ref 78.0–100.0)
MONO ABS: 0.4 10*3/uL (ref 0.1–1.0)
Monocytes Relative: 9.8 % (ref 3.0–12.0)
NEUTROS PCT: 58.9 % (ref 43.0–77.0)
Neutro Abs: 2.5 10*3/uL (ref 1.4–7.7)
Platelets: 118 10*3/uL — ABNORMAL LOW (ref 150.0–400.0)
RBC: 3.34 Mil/uL — ABNORMAL LOW (ref 3.87–5.11)
RDW: 13.6 % (ref 11.5–14.6)
WBC: 4.2 10*3/uL — ABNORMAL LOW (ref 4.5–10.5)

## 2013-11-21 ENCOUNTER — Other Ambulatory Visit: Payer: Self-pay | Admitting: Internal Medicine

## 2013-11-21 ENCOUNTER — Encounter: Payer: Self-pay | Admitting: *Deleted

## 2013-11-21 DIAGNOSIS — D649 Anemia, unspecified: Secondary | ICD-10-CM

## 2013-11-21 NOTE — Progress Notes (Signed)
Orders placed for f/u labs.  

## 2013-12-09 ENCOUNTER — Other Ambulatory Visit (INDEPENDENT_AMBULATORY_CARE_PROVIDER_SITE_OTHER): Payer: Medicare Other

## 2013-12-09 DIAGNOSIS — D649 Anemia, unspecified: Secondary | ICD-10-CM

## 2013-12-09 LAB — CBC WITH DIFFERENTIAL/PLATELET
BASOS ABS: 0 10*3/uL (ref 0.0–0.1)
Basophils Relative: 0.6 % (ref 0.0–3.0)
Eosinophils Absolute: 0.1 10*3/uL (ref 0.0–0.7)
Eosinophils Relative: 3.5 % (ref 0.0–5.0)
HEMATOCRIT: 33 % — AB (ref 36.0–46.0)
Hemoglobin: 11.3 g/dL — ABNORMAL LOW (ref 12.0–15.0)
LYMPHS PCT: 30 % (ref 12.0–46.0)
Lymphs Abs: 1.2 10*3/uL (ref 0.7–4.0)
MCHC: 34.3 g/dL (ref 30.0–36.0)
MCV: 98.7 fl (ref 78.0–100.0)
MONOS PCT: 8.6 % (ref 3.0–12.0)
Monocytes Absolute: 0.4 10*3/uL (ref 0.1–1.0)
NEUTROS ABS: 2.4 10*3/uL (ref 1.4–7.7)
Neutrophils Relative %: 57.3 % (ref 43.0–77.0)
Platelets: 115 10*3/uL — ABNORMAL LOW (ref 150.0–400.0)
RBC: 3.35 Mil/uL — AB (ref 3.87–5.11)
RDW: 13.1 % (ref 11.5–14.6)
WBC: 4.1 10*3/uL — ABNORMAL LOW (ref 4.5–10.5)

## 2013-12-09 LAB — VITAMIN B12: VITAMIN B 12: 257 pg/mL (ref 211–911)

## 2013-12-09 LAB — IBC PANEL
Iron: 96 ug/dL (ref 42–145)
Saturation Ratios: 25.7 % (ref 20.0–50.0)
TRANSFERRIN: 267 mg/dL (ref 212.0–360.0)

## 2013-12-09 LAB — FERRITIN: Ferritin: 23.7 ng/mL (ref 10.0–291.0)

## 2013-12-10 ENCOUNTER — Ambulatory Visit (INDEPENDENT_AMBULATORY_CARE_PROVIDER_SITE_OTHER): Payer: Medicare Other

## 2013-12-10 DIAGNOSIS — I4891 Unspecified atrial fibrillation: Secondary | ICD-10-CM

## 2013-12-10 DIAGNOSIS — Z7901 Long term (current) use of anticoagulants: Secondary | ICD-10-CM

## 2013-12-10 DIAGNOSIS — Z5181 Encounter for therapeutic drug level monitoring: Secondary | ICD-10-CM

## 2013-12-10 LAB — POCT INR: INR: 2.3

## 2014-01-12 ENCOUNTER — Other Ambulatory Visit: Payer: Self-pay

## 2014-01-12 NOTE — Telephone Encounter (Signed)
Patient needs a follow up appointment with Dr. Rockey Situ.

## 2014-01-13 ENCOUNTER — Other Ambulatory Visit: Payer: Self-pay | Admitting: Cardiovascular Disease

## 2014-01-21 ENCOUNTER — Ambulatory Visit (INDEPENDENT_AMBULATORY_CARE_PROVIDER_SITE_OTHER): Payer: Medicare Other

## 2014-01-21 DIAGNOSIS — Z7901 Long term (current) use of anticoagulants: Secondary | ICD-10-CM

## 2014-01-21 DIAGNOSIS — I4891 Unspecified atrial fibrillation: Secondary | ICD-10-CM

## 2014-01-21 DIAGNOSIS — Z5181 Encounter for therapeutic drug level monitoring: Secondary | ICD-10-CM

## 2014-01-21 LAB — POCT INR: INR: 2

## 2014-01-29 ENCOUNTER — Encounter: Payer: Self-pay | Admitting: Internal Medicine

## 2014-01-29 ENCOUNTER — Ambulatory Visit (INDEPENDENT_AMBULATORY_CARE_PROVIDER_SITE_OTHER): Payer: Medicare Other | Admitting: Internal Medicine

## 2014-01-29 VITALS — BP 130/80 | HR 78 | Temp 98.1°F | Ht 65.5 in | Wt 159.8 lb

## 2014-01-29 DIAGNOSIS — K7689 Other specified diseases of liver: Secondary | ICD-10-CM

## 2014-01-29 DIAGNOSIS — D649 Anemia, unspecified: Secondary | ICD-10-CM

## 2014-01-29 DIAGNOSIS — E785 Hyperlipidemia, unspecified: Secondary | ICD-10-CM

## 2014-01-29 DIAGNOSIS — R0989 Other specified symptoms and signs involving the circulatory and respiratory systems: Secondary | ICD-10-CM

## 2014-01-29 DIAGNOSIS — I739 Peripheral vascular disease, unspecified: Secondary | ICD-10-CM

## 2014-01-29 DIAGNOSIS — D696 Thrombocytopenia, unspecified: Secondary | ICD-10-CM

## 2014-01-29 DIAGNOSIS — I10 Essential (primary) hypertension: Secondary | ICD-10-CM

## 2014-01-29 DIAGNOSIS — F17200 Nicotine dependence, unspecified, uncomplicated: Secondary | ICD-10-CM

## 2014-01-29 DIAGNOSIS — I4891 Unspecified atrial fibrillation: Secondary | ICD-10-CM

## 2014-01-29 MED ORDER — CLONAZEPAM 0.5 MG PO TABS: ORAL_TABLET | ORAL | Status: DC

## 2014-01-29 NOTE — Progress Notes (Signed)
Subjective:    Patient ID: [name] [name], female    DOB: [birth year], 80 y.o.   MRN: [id number]  HPI 80 year old female with past history of hypertension, hypercholesterolemia, atrial fibrillation, carotid stenosis and peripheral vascular disease.  She comes in today to follow up on these issues as well as for a complete physical exam.  Here to follow up regarding her blood pressure.  Sees cardiology.  On coumadin for afib.  They are following her pt/inr.  Blood pressure has been elevated here in the office, but her outside checks look good.  I had her bring her blood pressure cuff to a previous visit, and her cuff seems to correlate with our readings.  States she has a history of her pressure running higher in the office.   She denies any chest pain or tightness.  Breathing stable.   She is now taking carvedilol 1 (25mg ) bid, chlorthalidone 25mg  1/2 tablet q day and Losartan 100mg  q day.  Not on amlodipine.  Had increased fatigue and felt washed out when on her previous medication regimen.  Apparently also did not tolerate increasing chlorthalidone.  She also did not tolerate clonidine.  She overall feels good.  Stays active.  She does report some increased stress recently.  Her female friend just recently passed away.  She feels she is coping relatively well.  Discussed the need to quit smoking.  Down to 5-6 cigarettes per day.  Planning to continue to cut down more.     Past Medical History  Diagnosis Date  . Osteoarthritis   . Hypertension   . GERD (gastroesophageal reflux disease)   . Hyperlipidemia   . Cholelithiasis   . Chronic kidney disease   . Impaired fasting glucose   . Atrial fibrillation   . Carotid stenosis   . Chronic low back pain     Outpatient Encounter Prescriptions as of 01/29/2014  Medication Sig  . aspirin 81 MG EC tablet Take 81 mg by mouth daily.    . carvedilol (COREG) 25 MG tablet TAKE 1/2 TABLET BY MOUTH TWICE A DAY WITH A MEAL  . chlorthalidone (HYGROTON) 25 MG  tablet Take 1 tablet (25 mg total) by mouth daily.  . clonazePAM (KLONOPIN) 0.5 MG tablet 1/2 - 1 tablet q day prn  . hydrALAZINE (APRESOLINE) 10 MG tablet Take 1 tablet (10 mg total) by mouth 3 (three) times daily.  Marland Kitchen losartan (COZAAR) 100 MG tablet TAKE 1 TABLET (100 MG TOTAL) BY MOUTH DAILY.  Marland Kitchen omeprazole (PRILOSEC) 20 MG capsule Take 20 mg by mouth daily.  . pravastatin (PRAVACHOL) 40 MG tablet Take 1 tablet (40 mg total) by mouth daily.  Marland Kitchen warfarin (COUMADIN) 5 MG tablet Take as directed by anticoagulation clinic  . [DISCONTINUED] carvedilol (COREG) 25 MG tablet Take 0.5 tablets (12.5 mg total) by mouth 2 (two) times daily with a meal.    Review of Systems Patient denies any headache, lightheadedness or dizziness.  No sinus or allergy symptoms.  No chest pain, tightness or palpitations.  No increased shortness of breath, cough or congestion.  No acid reflux.  No nausea or vomiting.  No abdominal pain or cramping.  No bowel change, such as diarrhea, constipation, BRBPR or melana.  No urine change.  Does smoke - down to 5-6 cigarettes per day.   Does not desire to quit at this time, but has cut down the amount she smokes.  Blood pressures on outside checks under good control.  Handling the stress.  Objective:   Physical Exam  Filed Vitals:   01/29/14 1039  BP: 130/80  Pulse: 78  Temp: 98.1 F (36.7 C)   Blood pressure recheck:  98/5  80 year old female in no acute distress.   HEENT:  Nares- clear.  Oropharynx - without lesions. NECK:  Supple.  Nontender.   HEART:  Appears to be regular. LUNGS:  No crackles or wheezing audible.  Respirations even and unlabored.  RADIAL PULSE:  Equal bilaterally.   ABDOMEN:  Soft, nontender.  Bowel sounds present and normal.     EXTREMITIES:  No increased edema present.  DP pulses palpable and equal bilaterally.       Assessment & Plan:  HYPERBILIRUBINEMIA.  Persistent elevation, but direct bili normal.  Decreased platelet count.  Recent  abdominal ultrasound revealed an hypoechoic cystic mass along the left lobe of the liver - felt to be left lobe hepatic cyst and cholelithiasis.  Again discussed with her regarding further w/up.  She declines at this time.  No abdominal pain, nausea or vomiting.  Eating and drinking well.  Will need to follow.    HEALTH MAINTENANCE.  Physical today.

## 2014-01-29 NOTE — Assessment & Plan Note (Addendum)
Found on abdominal ultrasound.  Discussed further w/up.  She declined at this time.  Will call when ready.

## 2014-01-29 NOTE — Assessment & Plan Note (Addendum)
Platelet count stable (at 135) but still decreased.  Abdominal ultrasound reviewed.  No mention of splenomegaly.  Follow.

## 2014-01-29 NOTE — Progress Notes (Signed)
Pre-visit discussion using our clinic review tool. No additional management support is needed unless otherwise documented below in the visit note.  

## 2014-01-29 NOTE — Assessment & Plan Note (Signed)
With documented carotid stenosis.  States followed by Dr Rockey Situ.  On coumadin.

## 2014-02-01 ENCOUNTER — Encounter: Payer: Self-pay | Admitting: Internal Medicine

## 2014-02-01 NOTE — Assessment & Plan Note (Signed)
Persistent elevation in her blood pressure here.  Her cuff appears to correlate relatively well with my check here.  Her blood pressures (per her report) at home are under much better control.  Hydralazine added.  She is now taking it tid.  Tolerating.  She has not tolerated increasing the doses of her other medications and did not tolerate the addition of clonidine.   In reviewing her abdominal ultrasound, her right kidney appeared to be smaller than the left.  She also has what sounds like an abdominal bruit.  Given the above findings on ultrasound associated with the fluctuation of her blood pressures and intolerance to adjustments in her medication, referred her to nephrology for help with her blood pressure and further w/up regarding possible renal artery stenosis.  She canceled her appt.  Will notify me if she changes her mind.  Blood pressure doing better.  Follow.  Follow metabolic panel.

## 2014-02-01 NOTE — Assessment & Plan Note (Signed)
Legs doing better - per pt.  Is followed by cardiology for her carotid disease.

## 2014-02-01 NOTE — Assessment & Plan Note (Signed)
Heart rate controlled.  On coumadin.  Followed by Dr Rockey Situ.

## 2014-02-01 NOTE — Assessment & Plan Note (Signed)
Smokes <10 cigarettes per day.  Have discussed the need to quit smoking.  Follow.

## 2014-02-01 NOTE — Assessment & Plan Note (Signed)
Follow cbc.  

## 2014-02-01 NOTE — Assessment & Plan Note (Signed)
On pravastatin.  Low cholesterol diet and exercise.  Follow lipid panel and liver function.

## 2014-02-12 ENCOUNTER — Encounter: Payer: Self-pay | Admitting: Cardiovascular Disease

## 2014-02-12 ENCOUNTER — Other Ambulatory Visit (INDEPENDENT_AMBULATORY_CARE_PROVIDER_SITE_OTHER): Payer: Medicare Other

## 2014-02-12 ENCOUNTER — Ambulatory Visit (INDEPENDENT_AMBULATORY_CARE_PROVIDER_SITE_OTHER): Payer: Medicare Other | Admitting: Cardiovascular Disease

## 2014-02-12 VITALS — BP 160/92 | HR 75 | Ht 65.5 in | Wt 159.2 lb

## 2014-02-12 DIAGNOSIS — I4891 Unspecified atrial fibrillation: Secondary | ICD-10-CM

## 2014-02-12 DIAGNOSIS — E785 Hyperlipidemia, unspecified: Secondary | ICD-10-CM

## 2014-02-12 DIAGNOSIS — D649 Anemia, unspecified: Secondary | ICD-10-CM

## 2014-02-12 DIAGNOSIS — F17200 Nicotine dependence, unspecified, uncomplicated: Secondary | ICD-10-CM

## 2014-02-12 DIAGNOSIS — R0602 Shortness of breath: Secondary | ICD-10-CM

## 2014-02-12 DIAGNOSIS — I10 Essential (primary) hypertension: Secondary | ICD-10-CM

## 2014-02-12 DIAGNOSIS — I6529 Occlusion and stenosis of unspecified carotid artery: Secondary | ICD-10-CM

## 2014-02-12 LAB — CBC WITH DIFFERENTIAL/PLATELET
BASOS PCT: 0.4 % (ref 0.0–3.0)
Basophils Absolute: 0 10*3/uL (ref 0.0–0.1)
Eosinophils Absolute: 0.1 10*3/uL (ref 0.0–0.7)
Eosinophils Relative: 2.2 % (ref 0.0–5.0)
HCT: 33.1 % — ABNORMAL LOW (ref 36.0–46.0)
Hemoglobin: 11.3 g/dL — ABNORMAL LOW (ref 12.0–15.0)
Lymphocytes Relative: 26 % (ref 12.0–46.0)
Lymphs Abs: 1.2 10*3/uL (ref 0.7–4.0)
MCHC: 34.2 g/dL (ref 30.0–36.0)
MCV: 97.8 fl (ref 78.0–100.0)
MONOS PCT: 7.9 % (ref 3.0–12.0)
Monocytes Absolute: 0.4 10*3/uL (ref 0.1–1.0)
NEUTROS PCT: 63.5 % (ref 43.0–77.0)
Neutro Abs: 2.9 10*3/uL (ref 1.4–7.7)
PLATELETS: 127 10*3/uL — AB (ref 150.0–400.0)
RBC: 3.39 Mil/uL — ABNORMAL LOW (ref 3.87–5.11)
RDW: 13.3 % (ref 11.5–14.6)
WBC: 4.6 10*3/uL (ref 4.5–10.5)

## 2014-02-12 LAB — LIPID PANEL
CHOL/HDL RATIO: 2
CHOLESTEROL: 138 mg/dL (ref 0–200)
HDL: 55.8 mg/dL (ref >39.00)
LDL CALC: 72 mg/dL (ref 0–99)
Triglycerides: 49 mg/dL (ref 0.0–149.0)
VLDL: 9.8 mg/dL (ref 0.0–40.0)

## 2014-02-12 LAB — HEPATIC FUNCTION PANEL
ALT: 19 U/L (ref 0–35)
AST: 20 U/L (ref 0–37)
Albumin: 4 g/dL (ref 3.5–5.2)
Alkaline Phosphatase: 59 U/L (ref 39–117)
BILIRUBIN TOTAL: 1.3 mg/dL — AB (ref 0.3–1.2)
Bilirubin, Direct: 0.2 mg/dL (ref 0.0–0.3)
Total Protein: 6.5 g/dL (ref 6.0–8.3)

## 2014-02-12 LAB — BASIC METABOLIC PANEL
BUN: 14 mg/dL (ref 6–23)
CALCIUM: 9.8 mg/dL (ref 8.4–10.5)
CHLORIDE: 98 meq/L (ref 96–112)
CO2: 26 mEq/L (ref 19–32)
Creatinine, Ser: 0.9 mg/dL (ref 0.4–1.2)
GFR: 61.9 mL/min (ref >60.00)
Glucose, Bld: 119 mg/dL — ABNORMAL HIGH (ref 70–99)
Potassium: 4.2 mEq/L (ref 3.5–5.1)
Sodium: 134 mEq/L — ABNORMAL LOW (ref 135–145)

## 2014-02-12 LAB — TSH: TSH: 1.7 u[IU]/mL (ref 0.35–5.50)

## 2014-02-12 LAB — FERRITIN: FERRITIN: 25.2 ng/mL (ref 10.0–291.0)

## 2014-02-12 NOTE — Assessment & Plan Note (Signed)
We have encouraged her to continue to work on weaning her cigarettes and smoking cessation. She will continue to work on this and does not want any assistance with chantix.  

## 2014-02-12 NOTE — Assessment & Plan Note (Signed)
60-79% bilateral carotid arterial stenosis. This was in 2012. We'll recommend repeat study

## 2014-02-12 NOTE — Patient Instructions (Signed)
You are doing well. No medication changes were made.  Please monitor your blood pressure at home If it runs high (top number >140), please call the office   Please call us if you have new issues that need to be addressed before your next appt.  Your physician wants you to follow-up in: 6 months.  You will receive a reminder letter in the mail two months in advance. If you don't receive a letter, please call our office to schedule the follow-up appointment.

## 2014-02-12 NOTE — Assessment & Plan Note (Signed)
Chronic mild shortness of breath with exertion. Likely from underlying COPD, possibly poorly controlled hypertension

## 2014-02-12 NOTE — Assessment & Plan Note (Addendum)
Cholesterol is at goal on the current lipid regimen. No changes to the medications were made.  

## 2014-02-12 NOTE — Assessment & Plan Note (Signed)
Blood pressure continues to be an issue. Relatively poor controlled on visits to the office as well as with Dr. Nicki Reaper. Difficult to determine where her numbers are running as she is not checking a regular basis at home. Encouraged her to buy a blood pressure cuff and start monitoring her numbers. Talked about having a goal systolic less than 174. Encouraged her to call our office for further medication titration if needed. She misses doses of hydralazine in the middle of the day. We may have to think of alternative medications if blood pressures continue to run high. Could try clonidine, Cardura, isosorbide

## 2014-02-12 NOTE — Progress Notes (Signed)
Patient ID: [name] [name], female    DOB: 17-Jul-1928, 80 y.o.   MRN: [id number]  HPI Comments: 80 yo with history of atrial fibrillation, HTN, 60-79% bilateral carotid stenosis, continued smoking presents for followup.    She has not been checking her blood pressure at home.  She reports that she is taking carvedilol 12.5 mg twice a day. She decrease the dose secondary to fatigue. Also taking losartan and chlorthalidone (possibly a half pill secondary to incontinence). She reports her blood pressure is better controlled at home is in the office. She's unsure exactly what her blood pressures are at home. Amlodipine was weaned off after her last visit for lower extremity edema. Edema has since resolved.  She continues to smoke less than one half pack per day. She denies exertional shortness of breath or chest pain.  In general she feels well with no complaints Total cholesterol 138  Previous blood work last year showed elevated creatinine. This has improved on lower chlorthalidone  Echo in 7/11 showed preserved EF with mild mitral regurgitaiton and mild pulmonary hypertension (PA systolic pressure 42 mmHg).  Carotids repeated in 12/11 were stable.    cholesterol showed total cholesterol 153, LDL 76   ECG: atrial fibrillation at 75 beats per minute, otherwise normal     Outpatient Encounter Prescriptions as of 02/12/2014  Medication Sig  . aspirin 81 MG EC tablet Take 81 mg by mouth daily.    . carvedilol (COREG) 25 MG tablet TAKE 1/2 TABLET BY MOUTH TWICE A DAY WITH A MEAL  . chlorthalidone (HYGROTON) 25 MG tablet Take 1 tablet (25 mg total) by mouth daily.  . clonazePAM (KLONOPIN) 0.5 MG tablet 1/2 - 1 tablet q day prn  . hydrALAZINE (APRESOLINE) 10 MG tablet Take 1 tablet (10 mg total) by mouth 3 (three) times daily.  Marland Kitchen losartan (COZAAR) 100 MG tablet TAKE 1 TABLET (100 MG TOTAL) BY MOUTH DAILY.  Marland Kitchen omeprazole (PRILOSEC) 20 MG capsule Take 20 mg by mouth daily.  . pravastatin  (PRAVACHOL) 40 MG tablet Take 1 tablet (40 mg total) by mouth daily.  Marland Kitchen warfarin (COUMADIN) 5 MG tablet Take as directed by anticoagulation clinic    Review of Systems  Constitutional: Negative.   HENT: Negative.   Eyes: Negative.   Respiratory: Negative.   Cardiovascular: Negative.   Gastrointestinal: Negative.   Endocrine: Negative.   Musculoskeletal: Negative.   Skin: Negative.   Allergic/Immunologic: Negative.   Neurological: Negative.   Hematological: Negative.   Psychiatric/Behavioral: Negative.   All other systems reviewed and are negative.   BP 160/92  Pulse 75  Ht 5' 5.5" (1.664 m)  Wt 159 lb 4 oz (72.235 kg)  BMI 26.09 kg/m2 Repeat blood pressure 180/100  Physical Exam  Nursing note and vitals reviewed. Constitutional: She is oriented to person, place, and time. She appears well-developed and well-nourished.  HENT:  Head: Normocephalic.  Nose: Nose normal.  Mouth/Throat: Oropharynx is clear and moist.  Eyes: Conjunctivae are normal. Pupils are equal, round, and reactive to light.  Neck: Normal range of motion. Neck supple. No JVD present. Carotid bruit is present.  Cardiovascular: Normal rate, regular rhythm, S1 normal, S2 normal and intact distal pulses.  Exam reveals no gallop and no friction rub.   Murmur heard.  Crescendo systolic murmur is present with a grade of 2/6  Trace edema around the ankles bilaterally  Pulmonary/Chest: Effort normal and breath sounds normal. No respiratory distress. She has no wheezes. She has no rales. She  exhibits no tenderness.  Abdominal: Soft. Bowel sounds are normal. She exhibits no distension. There is no tenderness.  Musculoskeletal: Normal range of motion. She exhibits no edema and no tenderness.  Lymphadenopathy:    She has no cervical adenopathy.  Neurological: She is alert and oriented to person, place, and time. Coordination normal.  Skin: Skin is warm and dry. No rash noted. No erythema.  Psychiatric: She has a  normal mood and affect. Her behavior is normal. Judgment and thought content normal.    Assessment and Plan

## 2014-02-12 NOTE — Assessment & Plan Note (Signed)
Maintaining normal sinus rhythm today. Encouraged her to stay in her carvedilol

## 2014-02-13 ENCOUNTER — Telehealth: Payer: Self-pay

## 2014-02-13 DIAGNOSIS — I6529 Occlusion and stenosis of unspecified carotid artery: Secondary | ICD-10-CM

## 2014-02-13 NOTE — Telephone Encounter (Signed)
Spoke w/ pt.  Advised her that Dr. Rockey Situ recommends that she have a carotid u/s, as her last test was in 2012. She states that she will check her calendar and call back when it is convenient to schedule this.

## 2014-02-16 ENCOUNTER — Encounter: Payer: Self-pay | Admitting: *Deleted

## 2014-02-17 ENCOUNTER — Other Ambulatory Visit: Payer: Self-pay

## 2014-02-17 MED ORDER — CHLORTHALIDONE 25 MG PO TABS: 25.0000 mg | ORAL_TABLET | Freq: Every day | ORAL | Status: DC

## 2014-02-17 MED ORDER — WARFARIN SODIUM 5 MG PO TABS: ORAL_TABLET | ORAL | Status: DC

## 2014-02-25 ENCOUNTER — Telehealth: Payer: Self-pay | Admitting: *Deleted

## 2014-02-25 NOTE — Telephone Encounter (Signed)
Lmom to call and schedule a carotid doppler

## 2014-03-11 ENCOUNTER — Ambulatory Visit (INDEPENDENT_AMBULATORY_CARE_PROVIDER_SITE_OTHER): Payer: Medicare Other | Admitting: *Deleted

## 2014-03-11 DIAGNOSIS — Z5181 Encounter for therapeutic drug level monitoring: Secondary | ICD-10-CM

## 2014-03-11 DIAGNOSIS — I4891 Unspecified atrial fibrillation: Secondary | ICD-10-CM

## 2014-03-11 DIAGNOSIS — Z7901 Long term (current) use of anticoagulants: Secondary | ICD-10-CM

## 2014-03-11 LAB — POCT INR: INR: 1.7

## 2014-03-17 ENCOUNTER — Telehealth: Payer: Self-pay

## 2014-03-17 ENCOUNTER — Encounter (INDEPENDENT_AMBULATORY_CARE_PROVIDER_SITE_OTHER): Payer: Medicare Other

## 2014-03-17 DIAGNOSIS — I6529 Occlusion and stenosis of unspecified carotid artery: Secondary | ICD-10-CM

## 2014-03-17 NOTE — Telephone Encounter (Signed)
Pt dropped off list of BP readings: "107/62 Tues 1 pm pulse 93 137/63 Wed 11am pulse 74 145/71 Thurs 10:30am p 87 139/77 Fri 1:25 pm p 84 145/73 Sat 11:30 p 66 141/69 Sun 1:45 p 81 135/63 Mon 1:50 p 81 138/74 Wed12:45 p 78 125/58 Thurs 2:00 p 72"

## 2014-03-18 ENCOUNTER — Telehealth: Payer: Self-pay | Admitting: Internal Medicine

## 2014-03-18 NOTE — Telephone Encounter (Signed)
Patient Information:  Caller Name: [name] [name]  Phone: [phone]  Patient: , [name] [name]  Gender: Female  DOB: [birth year]  Age: 80 Years  PCP: Einar Pheasant  Office Follow Up:  Does the office need to follow up with this patient?: No  Instructions For The Office: N/A   Symptoms  Reason For Call & Symptoms: Coughing attack last night 2 hours after eating ice cream with pistastios and vomited. Continued with hacky cough overnight on and off and this morning had some dots of blood in sputum - 03/18/14. Takes Coumadin 2.5 mgs daily. NO other sx of bleeding. Cough started yesterday during the day, but she has had some sinus/ allergy sx on and off for past week. Watery eyes and facial pain, drainage in throat on and off. She has used Saline nasal spray and has helped. Denies shortness of breath or chest pain. Afebrile. Sputum is now clear @ 0930 03/18/14.   Reviewed Health History In EMR: Yes  Reviewed Medications In EMR: Yes  Reviewed Allergies In EMR: Yes  Reviewed Surgeries / Procedures: Yes  Date of Onset of Symptoms: 03/17/2014  Guideline(s) Used:  Cough  Disposition Per Guideline:   See Today or Tomorrow in Office  Reason For Disposition Reached:   Patient wants to be seen  Advice Given:  Reassurance  Coughing is the way that our lungs remove irritants and mucus. It helps protect our lungs from getting pneumonia.  You can get a dry hacking cough after a chest cold. Sometimes this type of cough can last 1-3 weeks, and be worse at night.  You can also get a cough after being exposed to irritating substances like smoke, strong perfumes, and dust.  Cough Medicines:  OTC Cough Drops: Cough drops can help a lot, especially for mild coughs. They reduce coughing by soothing your irritated throat and removing that tickle sensation in the back of the throat. Cough drops also have the advantage of portability - you can carry them with you.  Home Remedy - Hard Candy: Hard candy works just as well  as medicine-flavored OTC cough drops. Diabetics should use sugar-free candy.  Home Remedy - Honey: This old home remedy has been shown to help decrease coughing at night. The adult dosage is 2 teaspoons (10 ml) at bedtime. Honey should not be given to infants under one year of age.  Coughing Spasms:  Drink warm fluids. Inhale warm mist (Reason: both relax the airway and loosen up the phlegm).  Suck on cough drops or hard candy to coat the irritated throat.  Prevent Dehydration:  Drink adequate liquids.  This will help soothe an irritated or dry throat and loosen up the phlegm.  Expected Course:   The expected course depends on what is causing the cough.  Viral bronchitis (chest cold) causes a cough that lasts 1 to 3 weeks. Sometimes you may cough up lots of phlegm (sputum, mucus). The mucus can normally be white, gray, yellow, or green.  Call Back If:  Difficulty breathing  Cough lasts more than 3 weeks  Fever lasts > 3 days  You become worse.  Patient Will Follow Care Advice:  YES  Appointment Scheduled:  03/19/2014 08:15:00 Appointment Scheduled Provider:  Charolette Forward

## 2014-03-18 NOTE — Telephone Encounter (Signed)
Please call pt and confirm that since her conservation with the nurse this am, has she noticed any more blood (i.e., coughing up blood, etc).  If this reoccurs, I want her being seen immediately.  Confirm no other acute symptoms.  Thanks

## 2014-03-18 NOTE — Telephone Encounter (Signed)
See triage note in other phone encounter.

## 2014-03-18 NOTE — Telephone Encounter (Signed)
Pt called for appt, none available; transferred to triage.  Pt called back, stated she was unable to leave her phone number.  I transferred to triage, stayed on line, entered pt phone number for her.  Lost connection with pt at some point during the triage call.  Attempted calling pt back x4 to inform that triage will be calling her.  Pt line busy each attempt.

## 2014-03-18 NOTE — Telephone Encounter (Signed)
FYI

## 2014-03-18 NOTE — Telephone Encounter (Signed)
Pt states that she seems okay, & will keep appt tomorrow morning with Raquel.

## 2014-03-19 ENCOUNTER — Ambulatory Visit (INDEPENDENT_AMBULATORY_CARE_PROVIDER_SITE_OTHER): Payer: Medicare Other | Admitting: Adult Health

## 2014-03-19 ENCOUNTER — Encounter: Payer: Self-pay | Admitting: Adult Health

## 2014-03-19 VITALS — BP 186/89 | HR 77 | Temp 97.9°F | Resp 14 | Wt 154.8 lb

## 2014-03-19 DIAGNOSIS — J329 Chronic sinusitis, unspecified: Secondary | ICD-10-CM

## 2014-03-19 MED ORDER — AMOXICILLIN-POT CLAVULANATE 875-125 MG PO TABS: 1.0000 | ORAL_TABLET | Freq: Two times a day (BID) | ORAL | Status: DC

## 2014-03-19 NOTE — Patient Instructions (Signed)
  Please start the Augmentin. Take 1 tablet twice a day for 7 days.  Drink fluids to maintain hydration.  Use saline nasal spray to help irrigate sinuses.  If more episodes of blood in your sputum occur please call the office and I will send you for a chest xray. I suspect the episode you had was due to the violent coughing spell you had. Continue the Tussin as needed for cough.

## 2014-03-19 NOTE — Progress Notes (Signed)
Pre visit review using our clinic review tool, if applicable. No additional management support is needed unless otherwise documented below in the visit note. 

## 2014-03-19 NOTE — Progress Notes (Signed)
Subjective:    Patient ID: [name] [name], female    DOB: 1927-12-11, 80 y.o.   MRN: [id number]  HPI Pt is a very pleasant 80 y/o female who presents to clinic with cough, sinus drainage. She reports recent episode of violent coughing after which she saw some blood tinged sputum. She reports that the drainage from her sinuses "is not pretty" and what she is coughing up "is quite disgusting". No fever or chills reports. She was outdoors all day on Saturday at a wedding reception and thinks she may have caught something there or possibly triggered by all the pollen in the air. She wants to make sure this does not develop into something worse. She is taking tussin for cough with good results. No shortness of breath or chest pain.   Past Medical History  Diagnosis Date  . Osteoarthritis   . Hypertension   . GERD (gastroesophageal reflux disease)   . Hyperlipidemia   . Cholelithiasis   . Chronic kidney disease   . Impaired fasting glucose   . Atrial fibrillation   . Carotid stenosis   . Chronic low back pain    Current Outpatient Prescriptions on File Prior to Visit  Medication Sig Dispense Refill  . aspirin 81 MG EC tablet Take 81 mg by mouth daily.        . carvedilol (COREG) 25 MG tablet TAKE 1/2 TABLET BY MOUTH TWICE A DAY WITH A MEAL  30 tablet  6  . chlorthalidone (HYGROTON) 25 MG tablet Take 1 tablet (25 mg total) by mouth daily.  90 tablet  3  . clonazePAM (KLONOPIN) 0.5 MG tablet 1/2 - 1 tablet q day prn  30 tablet  0  . hydrALAZINE (APRESOLINE) 10 MG tablet Take 1 tablet (10 mg total) by mouth 3 (three) times daily.  90 tablet  5  . losartan (COZAAR) 100 MG tablet TAKE 1 TABLET (100 MG TOTAL) BY MOUTH DAILY.  90 tablet  3  . omeprazole (PRILOSEC) 20 MG capsule Take 20 mg by mouth daily.      . pravastatin (PRAVACHOL) 40 MG tablet Take 1 tablet (40 mg total) by mouth daily.  90 tablet  3  . warfarin (COUMADIN) 5 MG tablet Take as directed by anticoagulation clinic  30 tablet   3   No current facility-administered medications on file prior to visit.      Review of Systems  Constitutional: Negative for fever and chills.  HENT: Positive for postnasal drip and sinus pressure. Negative for sore throat.   Respiratory: Positive for cough.        Noted blood tinged sputum after violent coughing episode  All other systems reviewed and are negative.      Objective:   Physical Exam  Constitutional: She is oriented to person, place, and time. She appears well-developed and well-nourished. No distress.  HENT:  Head: Normocephalic and atraumatic.  Right Ear: External ear normal.  Left Ear: External ear normal.  Mouth/Throat: No oropharyngeal exudate.  Eyes: Conjunctivae and EOM are normal.  Cardiovascular: Normal rate, regular rhythm and normal heart sounds.  Exam reveals no gallop.   No murmur heard. Pulmonary/Chest: Effort normal and breath sounds normal. No respiratory distress. She has no wheezes. She has no rales.  Good air movement throughout lung fields. No use of accessory muscle for breathing  Lymphadenopathy:    She has no cervical adenopathy.  Neurological: She is alert and oriented to person, place, and time.  Psychiatric: She has a  normal mood and affect. Her behavior is normal. Judgment and thought content normal.      Assessment & Plan:   1. Sinusitis Coughing spell causing blood tinged sputum. Will start Augmentin bid x 7 days. If symptoms reoccur, will send for chest xray. I believe that the blood was caused by her report of the violent coughing. RTC if symptoms do not improve.

## 2014-03-20 ENCOUNTER — Telehealth: Payer: Self-pay

## 2014-03-20 NOTE — Telephone Encounter (Signed)
Received written note from pt for Dr. Donivan Scull review:  "Pressure reading: 107/62 Tuesday  1pm,  Pulse 93 137/63 Wed  11am, pulse 74 145/71 Thurs 10:30am, p 87 139/77 Fri 1:25pm, p 84 145/73 Sat 11:30, p 66 141/69 Sun 1:45, p 81 135/63 Mon 1:50, p 81 138/74 Wed 12:45, p 78 125/58 Thurs 2:00, p 72"  Dr. Rockey Situ reviewed "Looks [place]!  TG"

## 2014-03-25 ENCOUNTER — Ambulatory Visit (INDEPENDENT_AMBULATORY_CARE_PROVIDER_SITE_OTHER): Payer: Medicare Other

## 2014-03-25 DIAGNOSIS — Z7901 Long term (current) use of anticoagulants: Secondary | ICD-10-CM

## 2014-03-25 DIAGNOSIS — Z5181 Encounter for therapeutic drug level monitoring: Secondary | ICD-10-CM

## 2014-03-25 DIAGNOSIS — I4891 Unspecified atrial fibrillation: Secondary | ICD-10-CM

## 2014-03-25 LAB — POCT INR: INR: 1.8

## 2014-04-08 ENCOUNTER — Ambulatory Visit (INDEPENDENT_AMBULATORY_CARE_PROVIDER_SITE_OTHER): Payer: Medicare Other

## 2014-04-08 DIAGNOSIS — Z5181 Encounter for therapeutic drug level monitoring: Secondary | ICD-10-CM

## 2014-04-08 DIAGNOSIS — Z7901 Long term (current) use of anticoagulants: Secondary | ICD-10-CM

## 2014-04-08 DIAGNOSIS — I4891 Unspecified atrial fibrillation: Secondary | ICD-10-CM

## 2014-04-08 LAB — POCT INR: INR: 2.5

## 2014-05-06 ENCOUNTER — Ambulatory Visit (INDEPENDENT_AMBULATORY_CARE_PROVIDER_SITE_OTHER): Payer: Medicare Other

## 2014-05-06 DIAGNOSIS — Z5181 Encounter for therapeutic drug level monitoring: Secondary | ICD-10-CM

## 2014-05-06 DIAGNOSIS — Z7901 Long term (current) use of anticoagulants: Secondary | ICD-10-CM

## 2014-05-06 DIAGNOSIS — I4891 Unspecified atrial fibrillation: Secondary | ICD-10-CM

## 2014-05-06 LAB — POCT INR: INR: 2.5

## 2014-05-08 ENCOUNTER — Ambulatory Visit (INDEPENDENT_AMBULATORY_CARE_PROVIDER_SITE_OTHER): Payer: Medicare Other | Admitting: Internal Medicine

## 2014-05-08 ENCOUNTER — Encounter: Payer: Self-pay | Admitting: Internal Medicine

## 2014-05-08 VITALS — BP 140/80 | HR 86 | Temp 98.3°F | Ht 65.5 in | Wt 153.2 lb

## 2014-05-08 DIAGNOSIS — K7689 Other specified diseases of liver: Secondary | ICD-10-CM

## 2014-05-08 DIAGNOSIS — I10 Essential (primary) hypertension: Secondary | ICD-10-CM

## 2014-05-08 DIAGNOSIS — D696 Thrombocytopenia, unspecified: Secondary | ICD-10-CM

## 2014-05-08 DIAGNOSIS — I482 Chronic atrial fibrillation, unspecified: Secondary | ICD-10-CM

## 2014-05-08 DIAGNOSIS — F17200 Nicotine dependence, unspecified, uncomplicated: Secondary | ICD-10-CM

## 2014-05-08 DIAGNOSIS — D649 Anemia, unspecified: Secondary | ICD-10-CM

## 2014-05-08 DIAGNOSIS — E785 Hyperlipidemia, unspecified: Secondary | ICD-10-CM

## 2014-05-08 DIAGNOSIS — R0989 Other specified symptoms and signs involving the circulatory and respiratory systems: Secondary | ICD-10-CM

## 2014-05-08 DIAGNOSIS — I4891 Unspecified atrial fibrillation: Secondary | ICD-10-CM

## 2014-05-08 DIAGNOSIS — I739 Peripheral vascular disease, unspecified: Secondary | ICD-10-CM

## 2014-05-08 MED ORDER — CLONAZEPAM 0.5 MG PO TABS: ORAL_TABLET | ORAL | Status: DC

## 2014-05-09 ENCOUNTER — Encounter: Payer: Self-pay | Admitting: Internal Medicine

## 2014-05-09 NOTE — Assessment & Plan Note (Signed)
On pravastatin.  Low cholesterol diet and exercise.  Follow lipid panel and liver function.

## 2014-05-09 NOTE — Assessment & Plan Note (Signed)
With documented carotid stenosis.  States followed by Dr Rockey Situ.  On coumadin.

## 2014-05-09 NOTE — Assessment & Plan Note (Signed)
Legs doing better - per pt.  Is followed by cardiology for her carotid disease.

## 2014-05-09 NOTE — Assessment & Plan Note (Signed)
Smokes <10 cigarettes per day.  Have discussed the need to quit smoking.  Follow.

## 2014-05-09 NOTE — Progress Notes (Signed)
Subjective:    Patient ID: Samona Chihuahua, female    DOB: 01/01/1928, 80 y.o.   MRN: [id number]  HPI 80 year old female with past history of hypertension, hypercholesterolemia, atrial fibrillation, carotid stenosis and peripheral vascular disease.  She comes in today for a scheduled follow up.   Here to follow up regarding her blood pressure.  Sees cardiology.  On coumadin for afib.  They are following her pt/inr.  Blood pressure has been elevated here in the office, but her outside checks look good.  I had her bring her blood pressure cuff to a previous visit, and her cuff seems to correlate with our readings.  States she has a history of her pressure running higher in the office.   She denies any chest pain or tightness.  Breathing stable.   Had increased fatigue and felt washed out when on her previous medication regimen.  Apparently also did not tolerate increasing chlorthalidone.  She also did not tolerate clonidine.  She overall feels good.  Stays active.  Discussed the need to quit smoking.  Down to 5-6 cigarettes per day.  Planning to continue to cut down more.  Not ready to quit.     Past Medical History  Diagnosis Date  . Osteoarthritis   . Hypertension   . GERD (gastroesophageal reflux disease)   . Hyperlipidemia   . Cholelithiasis   . Chronic kidney disease   . Impaired fasting glucose   . Atrial fibrillation   . Carotid stenosis   . Chronic low back pain     Outpatient Encounter Prescriptions as of 05/08/2014  Medication Sig  . aspirin 81 MG EC tablet Take 81 mg by mouth daily.    . carvedilol (COREG) 25 MG tablet TAKE 1/2 TABLET BY MOUTH TWICE A DAY WITH A MEAL  . chlorthalidone (HYGROTON) 25 MG tablet Take 1 tablet (25 mg total) by mouth daily.  . clonazePAM (KLONOPIN) 0.5 MG tablet 1/2 - 1 tablet q day prn  . hydrALAZINE (APRESOLINE) 10 MG tablet Take 1 tablet (10 mg total) by mouth 3 (three) times daily.  Marland Kitchen losartan (COZAAR) 100 MG tablet TAKE 1 TABLET (100 MG TOTAL)  BY MOUTH DAILY.  Marland Kitchen omeprazole (PRILOSEC) 20 MG capsule Take 20 mg by mouth daily.  . pravastatin (PRAVACHOL) 40 MG tablet Take 1 tablet (40 mg total) by mouth daily.  Marland Kitchen warfarin (COUMADIN) 5 MG tablet Take as directed by anticoagulation clinic  . [DISCONTINUED] amoxicillin-clavulanate (AUGMENTIN) 875-125 MG per tablet Take 1 tablet by mouth 2 (two) times daily.  . [DISCONTINUED] clonazePAM (KLONOPIN) 0.5 MG tablet 1/2 - 1 tablet q day prn    Review of Systems Patient denies any headache, lightheadedness or dizziness.  No sinus or allergy symptoms.  No chest pain, tightness or palpitations.  No increased shortness of breath, cough or congestion.  No acid reflux.  No nausea or vomiting.  No abdominal pain or cramping.  No bowel change, such as diarrhea, constipation, BRBPR or melana.  No urine change.  Does smoke - down to 5-6 cigarettes per day.   Does not desire to quit at this time, but has cut down the amount she smokes.  Handling stress.  Takes an occasional clonazepam.      Objective:   Physical Exam  Filed Vitals:   05/08/14 1026  BP: 140/80  Pulse: 86  Temp: 98.3 F (36.8 C)   Blood pressure recheck:  She declines.   80 year old female in no acute distress.  HEENT:  Nares- clear.  Oropharynx - without lesions. NECK:  Supple.  Nontender.   HEART:  Appears to be regular. LUNGS:  No crackles or wheezing audible.  Respirations even and unlabored.  RADIAL PULSE:  Equal bilaterally.   ABDOMEN:  Soft, nontender.  Bowel sounds present and normal.     EXTREMITIES:  No increased edema present.  DP pulses palpable and equal bilaterally.       Assessment & Plan:  HYPERBILIRUBINEMIA.  Persistent elevation, but direct bili normal.  Decreased platelet count.  Recent abdominal ultrasound revealed an hypoechoic cystic mass along the left lobe of the liver - felt to be left lobe hepatic cyst and cholelithiasis.  Again discussed with her regarding further w/up.  She declines at this time.  No  abdominal pain, nausea or vomiting.  Eating and drinking well.  Will need to follow.    HEALTH MAINTENANCE.  Physical 01/29/14.  She declined mammogram.

## 2014-05-09 NOTE — Assessment & Plan Note (Signed)
Heart rate controlled.  On coumadin.  Followed by Dr Rockey Situ.

## 2014-05-09 NOTE — Assessment & Plan Note (Signed)
Found on abdominal ultrasound.  Discussed further w/up.  She declined at this time.  Will call when ready.

## 2014-05-09 NOTE — Assessment & Plan Note (Signed)
Persistent elevation in her blood pressure here.  Her cuff appears to correlate relatively well with my check here.  Her blood pressures (per her report) at home are under much better control.  Hydralazine added.  She is now taking it tid.  Tolerating.  She has not tolerated increasing the doses of her other medications and did not tolerate the addition of clonidine.   In reviewing her abdominal ultrasound, her right kidney appeared to be smaller than the left.  She also has what sounds like an abdominal bruit.  Given the above findings on ultrasound associated with the fluctuation of her blood pressures and intolerance to adjustments in her medication, referred her to nephrology for help with her blood pressure and further w/up regarding possible renal artery stenosis.  She canceled her appt.  Will notify me if she changes her mind.  Follow metabolic panel.  Blood pressure as outlined.  She declined recheck today.

## 2014-05-09 NOTE — Assessment & Plan Note (Signed)
Follow cbc.  

## 2014-05-09 NOTE — Assessment & Plan Note (Signed)
Platelet count has been stable, but still decreased.  Abdominal ultrasound reviewed.  No mention of splenomegaly.  Follow.

## 2014-06-03 ENCOUNTER — Encounter: Payer: Self-pay | Admitting: Internal Medicine

## 2014-06-04 ENCOUNTER — Telehealth: Payer: Self-pay | Admitting: Internal Medicine

## 2014-06-04 MED ORDER — HYDRALAZINE HCL 10 MG PO TABS
10.0000 mg | ORAL_TABLET | Freq: Three times a day (TID) | ORAL | Status: DC
Start: 2014-06-04 — End: 2014-12-05

## 2014-06-04 NOTE — Telephone Encounter (Signed)
hydrALAZINE (APRESOLINE) 10 MG tablet

## 2014-06-17 ENCOUNTER — Ambulatory Visit (INDEPENDENT_AMBULATORY_CARE_PROVIDER_SITE_OTHER): Payer: Medicare Other

## 2014-06-17 DIAGNOSIS — Z7901 Long term (current) use of anticoagulants: Secondary | ICD-10-CM

## 2014-06-17 DIAGNOSIS — Z5181 Encounter for therapeutic drug level monitoring: Secondary | ICD-10-CM

## 2014-06-17 DIAGNOSIS — I4891 Unspecified atrial fibrillation: Secondary | ICD-10-CM

## 2014-06-17 LAB — POCT INR: INR: 2.5

## 2014-07-22 ENCOUNTER — Encounter: Payer: Medicare Other | Admitting: Adult Health

## 2014-07-29 ENCOUNTER — Ambulatory Visit (INDEPENDENT_AMBULATORY_CARE_PROVIDER_SITE_OTHER): Payer: Medicare Other

## 2014-07-29 DIAGNOSIS — I4891 Unspecified atrial fibrillation: Secondary | ICD-10-CM

## 2014-07-29 DIAGNOSIS — Z7901 Long term (current) use of anticoagulants: Secondary | ICD-10-CM

## 2014-07-29 DIAGNOSIS — Z5181 Encounter for therapeutic drug level monitoring: Secondary | ICD-10-CM

## 2014-07-29 LAB — POCT INR: INR: 2.6

## 2014-08-12 ENCOUNTER — Encounter: Payer: Self-pay | Admitting: Cardiovascular Disease

## 2014-08-12 ENCOUNTER — Ambulatory Visit (INDEPENDENT_AMBULATORY_CARE_PROVIDER_SITE_OTHER): Payer: Medicare Other | Admitting: Cardiovascular Disease

## 2014-08-12 VITALS — BP 140/80 | HR 76 | Ht 65.5 in | Wt 154.5 lb

## 2014-08-12 DIAGNOSIS — I6529 Occlusion and stenosis of unspecified carotid artery: Secondary | ICD-10-CM

## 2014-08-12 DIAGNOSIS — E785 Hyperlipidemia, unspecified: Secondary | ICD-10-CM

## 2014-08-12 DIAGNOSIS — R0602 Shortness of breath: Secondary | ICD-10-CM

## 2014-08-12 DIAGNOSIS — F17200 Nicotine dependence, unspecified, uncomplicated: Secondary | ICD-10-CM

## 2014-08-12 DIAGNOSIS — I10 Essential (primary) hypertension: Secondary | ICD-10-CM

## 2014-08-12 DIAGNOSIS — I4891 Unspecified atrial fibrillation: Secondary | ICD-10-CM

## 2014-08-12 MED ORDER — CARVEDILOL 25 MG PO TABS: 25.0000 mg | ORAL_TABLET | Freq: Two times a day (BID) | ORAL | Status: DC

## 2014-08-12 NOTE — Assessment & Plan Note (Signed)
Chronic atrial fibrillation, on anticoagulation. No further medication changes made. Heart rate well controlled

## 2014-08-12 NOTE — Assessment & Plan Note (Signed)
Blood pressure is well controlled on today's visit. No changes made to the medications. 

## 2014-08-12 NOTE — Assessment & Plan Note (Signed)
We have encouraged her to continue to work on weaning her cigarettes and smoking cessation. She will continue to work on this and does not want any assistance with chantix.  

## 2014-08-12 NOTE — Assessment & Plan Note (Signed)
Cholesterol is at goal on the current lipid regimen. No changes to the medications were made.  

## 2014-08-12 NOTE — Assessment & Plan Note (Signed)
Stable chronic mild shortness of breath. Likely from underlying COPD

## 2014-08-12 NOTE — Progress Notes (Signed)
Patient ID: [name] [name], female    DOB: 02-Jun-1928, 80 y.o.   MRN: [id number]  HPI Comments: 80 yo with history of atrial fibrillation, HTN, 60-79% bilateral carotid stenosis, continued smoking presents for followup.    In followup today, she reports that she has labile blood pressure, sometimes low in the morning, higher at the end of the day. She continues to smoke. No interest in stopping.  Carotid ultrasound showing 60-79 percent bilateral disease in April 2015 Cholesterol is less than 150  Otherwise she is in good spirits,  She denies exertional shortness of breath or chest pain.  In general she feels well with no complaints  Previous blood work last year showed elevated creatinine. This has improved on lower chlorthalidone  Echo in 7/11 showed preserved EF with mild mitral regurgitaiton and mild pulmonary hypertension (PA systolic pressure 42 mmHg).     ECG: atrial fibrillation at 76 beats per minute, otherwise normal     Outpatient Encounter Prescriptions as of 08/12/2014  Medication Sig  . aspirin 81 MG EC tablet Take 81 mg by mouth daily.    . carvedilol (COREG) 25 MG tablet TAKE 1/2 TABLET BY MOUTH TWICE A DAY WITH A MEAL  . chlorthalidone (HYGROTON) 25 MG tablet Take 1 tablet (25 mg total) by mouth daily.  . clonazePAM (KLONOPIN) 0.5 MG tablet 1/2 - 1 tablet q day prn  . hydrALAZINE (APRESOLINE) 10 MG tablet Take 1 tablet (10 mg total) by mouth 3 (three) times daily.  Marland Kitchen losartan (COZAAR) 100 MG tablet TAKE 1 TABLET (100 MG TOTAL) BY MOUTH DAILY.  Marland Kitchen omeprazole (PRILOSEC) 20 MG capsule Take 20 mg by mouth daily.  . pravastatin (PRAVACHOL) 40 MG tablet Take 1 tablet (40 mg total) by mouth daily.  Marland Kitchen warfarin (COUMADIN) 5 MG tablet Take as directed by anticoagulation clinic    Review of Systems  Constitutional: Negative.   HENT: Negative.   Eyes: Negative.   Respiratory: Negative.   Cardiovascular: Negative.   Gastrointestinal: Negative.   Endocrine: Negative.    Musculoskeletal: Negative.   Skin: Negative.   Allergic/Immunologic: Negative.   Neurological: Negative.   Hematological: Negative.   Psychiatric/Behavioral: Negative.   All other systems reviewed and are negative.  BP 140/80  Pulse 76  Ht 5' 5.5" (1.664 m)  Wt 154 lb 8 oz (70.081 kg)  BMI 25.31 kg/m2  Physical Exam  Nursing note and vitals reviewed. Constitutional: She is oriented to person, place, and time. She appears well-developed and well-nourished.  HENT:  Head: Normocephalic.  Nose: Nose normal.  Mouth/Throat: Oropharynx is clear and moist.  Eyes: Conjunctivae are normal. Pupils are equal, round, and reactive to light.  Neck: Normal range of motion. Neck supple. No JVD present. Carotid bruit is present.  Cardiovascular: Normal rate, regular rhythm, S1 normal, S2 normal and intact distal pulses.  Exam reveals no gallop and no friction rub.   Murmur heard.  Crescendo systolic murmur is present with a grade of 2/6  Trace edema around the ankles bilaterally  Pulmonary/Chest: Effort normal and breath sounds normal. No respiratory distress. She has no wheezes. She has no rales. She exhibits no tenderness.  Abdominal: Soft. Bowel sounds are normal. She exhibits no distension. There is no tenderness.  Musculoskeletal: Normal range of motion. She exhibits no edema and no tenderness.  Lymphadenopathy:    She has no cervical adenopathy.  Neurological: She is alert and oriented to person, place, and time. Coordination normal.  Skin: Skin is warm and dry.  No rash noted. No erythema.  Psychiatric: She has a normal mood and affect. Her behavior is normal. Judgment and thought content normal.    Assessment and Plan

## 2014-08-12 NOTE — Assessment & Plan Note (Signed)
We have stressed to her the importance of smoking cessation. She is at goal LDL

## 2014-08-12 NOTE — Patient Instructions (Signed)
You are doing well. No medication changes were made.  Please call us if you have new issues that need to be addressed before your next appt.  Your physician wants you to follow-up in: 6 months.  You will receive a reminder letter in the mail two months in advance. If you don't receive a letter, please call our office to schedule the follow-up appointment.   

## 2014-08-19 ENCOUNTER — Other Ambulatory Visit: Payer: Self-pay

## 2014-08-19 MED ORDER — WARFARIN SODIUM 5 MG PO TABS: ORAL_TABLET | ORAL | Status: DC

## 2014-09-07 ENCOUNTER — Other Ambulatory Visit (INDEPENDENT_AMBULATORY_CARE_PROVIDER_SITE_OTHER): Payer: Medicare Other

## 2014-09-07 ENCOUNTER — Ambulatory Visit (INDEPENDENT_AMBULATORY_CARE_PROVIDER_SITE_OTHER): Payer: Medicare Other

## 2014-09-07 DIAGNOSIS — D696 Thrombocytopenia, unspecified: Secondary | ICD-10-CM

## 2014-09-07 DIAGNOSIS — D649 Anemia, unspecified: Secondary | ICD-10-CM

## 2014-09-07 DIAGNOSIS — I10 Essential (primary) hypertension: Secondary | ICD-10-CM

## 2014-09-07 DIAGNOSIS — E785 Hyperlipidemia, unspecified: Secondary | ICD-10-CM

## 2014-09-07 DIAGNOSIS — Z23 Encounter for immunization: Secondary | ICD-10-CM

## 2014-09-07 LAB — LIPID PANEL
Cholesterol: 138 mg/dL (ref 0–200)
HDL: 40.8 mg/dL (ref >39.00)
LDL CALC: 79 mg/dL (ref 0–99)
NonHDL: 97.2
Total CHOL/HDL Ratio: 3
Triglycerides: 90 mg/dL (ref 0.0–149.0)
VLDL: 18 mg/dL (ref 0.0–40.0)

## 2014-09-07 LAB — HEPATIC FUNCTION PANEL
ALT: 116 U/L — ABNORMAL HIGH (ref 0–35)
AST: 54 U/L — ABNORMAL HIGH (ref 0–37)
Albumin: 3.3 g/dL — ABNORMAL LOW (ref 3.5–5.2)
Alkaline Phosphatase: 156 U/L — ABNORMAL HIGH (ref 39–117)
BILIRUBIN DIRECT: 0.9 mg/dL — AB (ref 0.0–0.3)
BILIRUBIN TOTAL: 2.6 mg/dL — AB (ref 0.2–1.2)
Total Protein: 6.7 g/dL (ref 6.0–8.3)

## 2014-09-07 LAB — BASIC METABOLIC PANEL
BUN: 20 mg/dL (ref 6–23)
CHLORIDE: 95 meq/L — AB (ref 96–112)
CO2: 31 mEq/L (ref 19–32)
Calcium: 9.9 mg/dL (ref 8.4–10.5)
Creatinine, Ser: 1.1 mg/dL (ref 0.4–1.2)
GFR: 52.49 mL/min — ABNORMAL LOW (ref >60.00)
GLUCOSE: 108 mg/dL — AB (ref 70–99)
POTASSIUM: 3.7 meq/L (ref 3.5–5.1)
Sodium: 131 mEq/L — ABNORMAL LOW (ref 135–145)

## 2014-09-07 LAB — CBC WITH DIFFERENTIAL/PLATELET
BASOS PCT: 0.2 % (ref 0.0–3.0)
Basophils Absolute: 0 10*3/uL (ref 0.0–0.1)
EOS ABS: 0.1 10*3/uL (ref 0.0–0.7)
Eosinophils Relative: 2.1 % (ref 0.0–5.0)
HCT: 32.9 % — ABNORMAL LOW (ref 36.0–46.0)
Hemoglobin: 11.2 g/dL — ABNORMAL LOW (ref 12.0–15.0)
Lymphocytes Relative: 17.7 % (ref 12.0–46.0)
Lymphs Abs: 1 10*3/uL (ref 0.7–4.0)
MCHC: 34 g/dL (ref 30.0–36.0)
MCV: 99 fl (ref 78.0–100.0)
MONO ABS: 0.6 10*3/uL (ref 0.1–1.0)
Monocytes Relative: 11.1 % (ref 3.0–12.0)
NEUTROS PCT: 68.9 % (ref 43.0–77.0)
Neutro Abs: 3.8 10*3/uL (ref 1.4–7.7)
Platelets: 118 10*3/uL — ABNORMAL LOW (ref 150.0–400.0)
RBC: 3.33 Mil/uL — ABNORMAL LOW (ref 3.87–5.11)
RDW: 12.9 % (ref 11.5–15.5)
WBC: 5.6 10*3/uL (ref 4.0–10.5)

## 2014-09-07 LAB — FERRITIN: Ferritin: 68 ng/mL (ref 10.0–291.0)

## 2014-09-08 ENCOUNTER — Ambulatory Visit: Payer: Medicare Other

## 2014-09-08 ENCOUNTER — Ambulatory Visit (INDEPENDENT_AMBULATORY_CARE_PROVIDER_SITE_OTHER): Payer: Medicare Other | Admitting: Internal Medicine

## 2014-09-08 ENCOUNTER — Encounter: Payer: Self-pay | Admitting: Internal Medicine

## 2014-09-08 VITALS — BP 180/80 | HR 94 | Temp 98.0°F | Ht 65.5 in | Wt 154.5 lb

## 2014-09-08 DIAGNOSIS — F17200 Nicotine dependence, unspecified, uncomplicated: Secondary | ICD-10-CM

## 2014-09-08 DIAGNOSIS — I10 Essential (primary) hypertension: Secondary | ICD-10-CM

## 2014-09-08 DIAGNOSIS — D696 Thrombocytopenia, unspecified: Secondary | ICD-10-CM

## 2014-09-08 DIAGNOSIS — Z720 Tobacco use: Secondary | ICD-10-CM

## 2014-09-08 DIAGNOSIS — E871 Hypo-osmolality and hyponatremia: Secondary | ICD-10-CM

## 2014-09-08 DIAGNOSIS — D649 Anemia, unspecified: Secondary | ICD-10-CM

## 2014-09-08 DIAGNOSIS — I4891 Unspecified atrial fibrillation: Secondary | ICD-10-CM

## 2014-09-08 DIAGNOSIS — R945 Abnormal results of liver function studies: Secondary | ICD-10-CM

## 2014-09-08 DIAGNOSIS — R7989 Other specified abnormal findings of blood chemistry: Secondary | ICD-10-CM

## 2014-09-08 LAB — BASIC METABOLIC PANEL
BUN: 17 mg/dL (ref 6–23)
CO2: 22 mEq/L (ref 19–32)
Calcium: 10 mg/dL (ref 8.4–10.5)
Chloride: 96 mEq/L (ref 96–112)
Creatinine, Ser: 1 mg/dL (ref 0.4–1.2)
GFR: 55.5 mL/min — ABNORMAL LOW (ref >60.00)
Glucose, Bld: 92 mg/dL (ref 70–99)
Potassium: 3.5 mEq/L (ref 3.5–5.1)
SODIUM: 129 meq/L — AB (ref 135–145)

## 2014-09-08 LAB — HEPATIC FUNCTION PANEL
ALBUMIN: 3.3 g/dL — AB (ref 3.5–5.2)
ALT: 107 U/L — ABNORMAL HIGH (ref 0–35)
AST: 54 U/L — AB (ref 0–37)
Alkaline Phosphatase: 175 U/L — ABNORMAL HIGH (ref 39–117)
Bilirubin, Direct: 0.6 mg/dL — ABNORMAL HIGH (ref 0.0–0.3)
TOTAL PROTEIN: 6.7 g/dL (ref 6.0–8.3)
Total Bilirubin: 2 mg/dL — ABNORMAL HIGH (ref 0.2–1.2)

## 2014-09-08 MED ORDER — CLONAZEPAM 0.5 MG PO TABS: ORAL_TABLET | ORAL | Status: DC

## 2014-09-08 NOTE — Progress Notes (Signed)
Pre visit review using our clinic review tool, if applicable. No additional management support is needed unless otherwise documented below in the visit note. 

## 2014-09-09 ENCOUNTER — Ambulatory Visit (INDEPENDENT_AMBULATORY_CARE_PROVIDER_SITE_OTHER): Payer: Medicare Other

## 2014-09-09 DIAGNOSIS — Z5181 Encounter for therapeutic drug level monitoring: Secondary | ICD-10-CM

## 2014-09-09 DIAGNOSIS — Z7901 Long term (current) use of anticoagulants: Secondary | ICD-10-CM

## 2014-09-09 DIAGNOSIS — I4891 Unspecified atrial fibrillation: Secondary | ICD-10-CM

## 2014-09-09 LAB — POCT INR: INR: 2.7

## 2014-09-10 ENCOUNTER — Ambulatory Visit: Payer: Self-pay | Admitting: Internal Medicine

## 2014-09-11 ENCOUNTER — Telehealth: Payer: Self-pay | Admitting: Internal Medicine

## 2014-09-11 DIAGNOSIS — K8020 Calculus of gallbladder without cholecystitis without obstruction: Secondary | ICD-10-CM

## 2014-09-11 DIAGNOSIS — R945 Abnormal results of liver function studies: Secondary | ICD-10-CM

## 2014-09-11 DIAGNOSIS — R7989 Other specified abnormal findings of blood chemistry: Secondary | ICD-10-CM

## 2014-09-11 DIAGNOSIS — R1084 Generalized abdominal pain: Secondary | ICD-10-CM

## 2014-09-11 NOTE — Telephone Encounter (Signed)
Notify pt that I received her abdominal ultrasound results and it reveals gallstones and sludge.  Given these findings as well as her recent symptoms and abnormal liver function tests and bilirubin, I would like to refer her to surgery for further evaluation with question of need for gallbladder removal.   I can refer her to her surgeon of choice.  If no preference, I will refer to Dr Bary Castilla if ok with her.

## 2014-09-11 NOTE — Telephone Encounter (Signed)
See result note also

## 2014-09-13 ENCOUNTER — Encounter: Payer: Self-pay | Admitting: Internal Medicine

## 2014-09-13 DIAGNOSIS — R945 Abnormal results of liver function studies: Secondary | ICD-10-CM | POA: Insufficient documentation

## 2014-09-13 DIAGNOSIS — R7989 Other specified abnormal findings of blood chemistry: Secondary | ICD-10-CM | POA: Insufficient documentation

## 2014-09-13 NOTE — Progress Notes (Signed)
Subjective:    Patient ID: [name] [name], female    DOB: 07/17/1928, 79 y.o.   MRN: [id number]  HPI 80 year old female with past history of hypertension, hypercholesterolemia, atrial fibrillation, carotid stenosis and peripheral vascular disease.  She comes in today for a scheduled follow up.   Sees cardiology.  On coumadin for afib.  They are following her pt/inr.  Blood pressure has been elevated here in the office, but her outside checks look good.  I had her bring her blood pressure cuff to a previous visit, and her cuff seems to correlate with our readings.  States she has a history of her pressure running higher in the office.   She denies any chest pain or tightness.  Breathing stable.   Had increased fatigue and felt washed out when on her previous medication regimen.  Apparently also did not tolerate increasing chlorthalidone.  She also did not tolerate clonidine.   have discussed the need to quit smoking.  Not ready to quit.  She reports that last week she "over ate".  States had abdominal pain.  Had a few episodes of self induced (trying to make herself feel better).  Some diarrhea.  She reports feeling better.  Still being careful with what she is eating.  On prilosec now.     Past Medical History  Diagnosis Date  . Osteoarthritis   . Hypertension   . GERD (gastroesophageal reflux disease)   . Hyperlipidemia   . Cholelithiasis   . Chronic kidney disease   . Impaired fasting glucose   . Atrial fibrillation   . Carotid stenosis   . Chronic low back pain     Outpatient Encounter Prescriptions as of 09/08/2014  Medication Sig  . aspirin 81 MG EC tablet Take 81 mg by mouth daily.    . carvedilol (COREG) 25 MG tablet Take 1 tablet (25 mg total) by mouth 2 (two) times daily with a meal.  . chlorthalidone (HYGROTON) 25 MG tablet Take 1 tablet (25 mg total) by mouth daily.  . clonazePAM (KLONOPIN) 0.5 MG tablet 1/2 - 1 tablet q day prn  . hydrALAZINE (APRESOLINE) 10 MG tablet Take  1 tablet (10 mg total) by mouth 3 (three) times daily.  Marland Kitchen losartan (COZAAR) 100 MG tablet TAKE 1 TABLET (100 MG TOTAL) BY MOUTH DAILY.  Marland Kitchen omeprazole (PRILOSEC) 20 MG capsule Take 20 mg by mouth daily.  . pravastatin (PRAVACHOL) 40 MG tablet Take 1 tablet (40 mg total) by mouth daily.  Marland Kitchen warfarin (COUMADIN) 5 MG tablet Take as directed by anticoagulation clinic  . [DISCONTINUED] clonazePAM (KLONOPIN) 0.5 MG tablet 1/2 - 1 tablet q day prn    Review of Systems Patient denies any headache, lightheadedness or dizziness.  No sinus or allergy symptoms.  No chest pain, tightness or palpitations.  No increased shortness of breath, cough or congestion.  No acid reflux.  Nausea and vomiting as outlined.  Abdominal pain as outlined.  Is better.  No BRBPR or melana. Some diarrhea.   No urine change.  Does smoke - down to 5-6 cigarettes per day.   Does not desire to quit at this time, but has cut down the amount she smokes.  Handling stress.  Takes an occasional clonazepam.      Objective:   Physical Exam  Filed Vitals:   09/08/14 1020  BP: 180/80  Pulse: 94  Temp: 98 F (36.7 C)   Blood pressure recheck: 39/64   80 year old female in no  acute distress.   HEENT:  Nares- clear.  Oropharynx - without lesions. NECK:  Supple.  Nontender.   HEART:  Appears to be regular. LUNGS:  No crackles or wheezing audible.  Respirations even and unlabored.  RADIAL PULSE:  Equal bilaterally.   ABDOMEN:  Soft.  No significant tenderness to palpation.  Bowel sounds present and normal.     EXTREMITIES:  No increased edema present.  DP pulses palpable and equal bilaterally.       Assessment & Plan:  1. Abnormal liver function tests Recent liver panel elevated.  Elevated bilirubin as well.  Had abdominal pain, emesis and diarrhea as outlined.   - Hepatic function panel - US Abdomen Complete; Future Feeling better.  Follow closely.    2. Hyponatremia Encourage increased po intake.   - Basic metabolic  panel  3. Essential hypertension, benign Blood pressure elevated here.  Better on outside checks.  Same medication regimen.  Follow.    4. Atrial fibrillation, unspecified Rate controlled.  Sees cardiology.  On coumadin.   5. Thrombocytopenia Platelet count 118.  Follow. Check abdominal US.    6. Anemia, unspecified anemia type Hgb stable 11.2.  Follow.    7. Smoking Desires not to stop at this time.  Follow.    HEALTH MAINTENANCE.  Physical 01/29/14.  She declined mammogram.

## 2014-09-15 ENCOUNTER — Telehealth: Payer: Self-pay | Admitting: *Deleted

## 2014-09-15 ENCOUNTER — Encounter: Payer: Self-pay | Admitting: *Deleted

## 2014-09-15 DIAGNOSIS — E871 Hypo-osmolality and hyponatremia: Secondary | ICD-10-CM

## 2014-09-15 DIAGNOSIS — R7989 Other specified abnormal findings of blood chemistry: Secondary | ICD-10-CM

## 2014-09-15 DIAGNOSIS — R945 Abnormal results of liver function studies: Secondary | ICD-10-CM

## 2014-09-15 NOTE — Telephone Encounter (Signed)
Orders placed for labs

## 2014-09-15 NOTE — Telephone Encounter (Signed)
Order placed for general surgery referral.

## 2014-09-15 NOTE — Telephone Encounter (Signed)
Pt notified of results & willing to proceed with referral to Dr. Bary Castilla

## 2014-09-15 NOTE — Telephone Encounter (Signed)
Pt is coming tomorrow what labs and dx?

## 2014-09-16 ENCOUNTER — Other Ambulatory Visit (INDEPENDENT_AMBULATORY_CARE_PROVIDER_SITE_OTHER): Payer: Medicare Other

## 2014-09-16 DIAGNOSIS — E871 Hypo-osmolality and hyponatremia: Secondary | ICD-10-CM

## 2014-09-16 DIAGNOSIS — R945 Abnormal results of liver function studies: Secondary | ICD-10-CM

## 2014-09-16 DIAGNOSIS — R7989 Other specified abnormal findings of blood chemistry: Secondary | ICD-10-CM

## 2014-09-16 LAB — HEPATIC FUNCTION PANEL
ALBUMIN: 3.4 g/dL — AB (ref 3.5–5.2)
ALK PHOS: 91 U/L (ref 39–117)
ALT: 28 U/L (ref 0–35)
AST: 27 U/L (ref 0–37)
BILIRUBIN DIRECT: 0.4 mg/dL — AB (ref 0.0–0.3)
BILIRUBIN TOTAL: 1.5 mg/dL — AB (ref 0.2–1.2)
Total Protein: 6.4 g/dL (ref 6.0–8.3)

## 2014-09-16 LAB — BASIC METABOLIC PANEL
BUN: 12 mg/dL (ref 6–23)
CO2: 21 meq/L (ref 19–32)
CREATININE: 1 mg/dL (ref 0.4–1.2)
Calcium: 9.8 mg/dL (ref 8.4–10.5)
Chloride: 100 mEq/L (ref 96–112)
GFR: 54.26 mL/min — ABNORMAL LOW (ref >60.00)
GLUCOSE: 171 mg/dL — AB (ref 70–99)
Potassium: 4 mEq/L (ref 3.5–5.1)
Sodium: 133 mEq/L — ABNORMAL LOW (ref 135–145)

## 2014-09-17 ENCOUNTER — Encounter: Payer: Self-pay | Admitting: *Deleted

## 2014-09-22 ENCOUNTER — Ambulatory Visit: Payer: Self-pay | Admitting: General Surgery

## 2014-09-24 ENCOUNTER — Encounter: Payer: Self-pay | Admitting: *Deleted

## 2014-09-28 ENCOUNTER — Ambulatory Visit (INDEPENDENT_AMBULATORY_CARE_PROVIDER_SITE_OTHER): Payer: Medicare Other | Admitting: General Surgery

## 2014-09-28 ENCOUNTER — Ambulatory Visit: Payer: Self-pay | Admitting: General Surgery

## 2014-09-28 ENCOUNTER — Encounter: Payer: Self-pay | Admitting: General Surgery

## 2014-09-28 VITALS — BP 134/70 | HR 76 | Resp 14 | Ht 65.0 in | Wt 156.0 lb

## 2014-09-28 DIAGNOSIS — K8020 Calculus of gallbladder without cholecystitis without obstruction: Secondary | ICD-10-CM

## 2014-09-28 NOTE — Progress Notes (Signed)
Patient ID: [name] [name], female   DOB: 11-16-1927, 80 y.o.   MRN: [id number]  Chief Complaint  Patient presents with  . Abdominal Pain    HPI [name] [name] is a 80 y.o. female here today for a evaluation of abdominal pain. She states this happen two weeks ago. Patient states the pain was all over her stomach, was extremely severe and associated with vomiting. She did not appreciate any change in bowel functions or urine color. By the following day she was asymptomatic. She had a ultrasound done at [facility] on 09/10/14.   The patient is accompanied today by her oldest daughter, [name] [name], who was present for the interview and exam. HPI  Past Medical History  Diagnosis Date  . Osteoarthritis   . Hypertension   . GERD (gastroesophageal reflux disease)   . Hyperlipidemia   . Cholelithiasis   . Chronic kidney disease   . Impaired fasting glucose   . Atrial fibrillation   . Carotid stenosis   . Chronic low back pain     Past Surgical History  Procedure Laterality Date  . Cataract extraction      x2  . Appendectomy      Family History  Problem Relation Age of Onset  . Atrial fibrillation Sister   . Atrial fibrillation Brother   . Heart attack Father 100  . Heart failure Sister 77  . Stroke Sister     Social History History  Substance Use Topics  . Smoking status: Current Every Day Smoker -- 0.50 packs/day    Types: Cigarettes  . Smokeless tobacco: Never Used  . Alcohol Use: 0.0 oz/week    0 Not specified per week     Comment: occasional    No Known Allergies  Current Outpatient Prescriptions  Medication Sig Dispense Refill  . aspirin 81 MG EC tablet Take 81 mg by mouth daily.      . carvedilol (COREG) 25 MG tablet Take 1 tablet (25 mg total) by mouth 2 (two) times daily with a meal. 180 tablet 3  . chlorthalidone (HYGROTON) 25 MG tablet Take 1 tablet (25 mg total) by mouth daily. 90 tablet 3  . clonazePAM (KLONOPIN) 0.5 MG tablet 1/2 - 1 tablet q day prn 30  tablet 0  . hydrALAZINE (APRESOLINE) 10 MG tablet Take 1 tablet (10 mg total) by mouth 3 (three) times daily. 90 tablet 5  . losartan (COZAAR) 100 MG tablet TAKE 1 TABLET (100 MG TOTAL) BY MOUTH DAILY. 90 tablet 3  . omeprazole (PRILOSEC) 20 MG capsule Take 20 mg by mouth daily.    . pravastatin (PRAVACHOL) 40 MG tablet Take 1 tablet (40 mg total) by mouth daily. 90 tablet 3  . warfarin (COUMADIN) 5 MG tablet Take as directed by anticoagulation clinic 30 tablet 3   No current facility-administered medications for this visit.    Review of Systems Review of Systems  Constitutional: Negative.   Respiratory: Negative.   Cardiovascular: Negative.   Gastrointestinal: Positive for abdominal pain. Negative for nausea, diarrhea, constipation, blood in stool, abdominal distention and rectal pain.    Blood pressure 134/70, pulse 76, resp. rate 14, height 5\' 5"  (1.651 m), weight 156 lb (70.761 kg).  Physical Exam Physical Exam  Constitutional: She is oriented to person, place, and time. She appears well-developed and well-nourished.  Eyes: Conjunctivae are normal. No scleral icterus.  Neck: Neck supple.  Cardiovascular: Normal rate.  An irregular rhythm present.  Murmur heard.  Systolic murmur is present with a  grade of 2/6  Pulmonary/Chest: Effort normal and breath sounds normal.  Abdominal: Soft. Normal appearance and bowel sounds are normal. There is no hepatomegaly. There is no tenderness. No hernia.  Lymphadenopathy:    She has no cervical adenopathy.  Neurological: She is alert and oriented to person, place, and time.  Skin: Skin is warm and dry.    Data Reviewed Hepatic function panels dating back 3 weeks ago showed a rise in the bilirubin to 2.6 with an elevation of the direct component to 0.9, this was associated with a modest elevation of the alkaline phosphatase and serum transaminases. This is return to near-normal values over the last 2 determinations.  Bilirubin is still  minimally elevated at 1.5at baseline with a normal direct bilirubin suggesting Gilbert's syndrome. Ultrasound of the abdomen dated 09/10/2014 showed gallstones and sludge. No wall thickening. Common bile duct 2 mm. Simple cyst of the left lobe of the liver.  Assessment    Episode of biliary colic with transient elevation of the serum transaminases and bilirubin representing common bile duct obstruction, resolved.     Plan    The patient is in very good health for her age. With this recent episode of biliary obstruction she is at risk for recurrent episodes. I would advise that she have elective cholecystectomy, her Coumadin therapy could complicate emergency surgery.  At this time she is very cool to the idea of elective surgery as she is feeling well.  Risks of surgery including those with injury to the biliary tract, general anesthesia and ongoing anticoagulation were reviewed. She'll consider her options and notify the office of how she would like to proceed.   PCP:  Nash Shearer 09/29/2014, 8:30 AM

## 2014-09-28 NOTE — Patient Instructions (Signed)

## 2014-09-29 DIAGNOSIS — K8020 Calculus of gallbladder without cholecystitis without obstruction: Secondary | ICD-10-CM | POA: Insufficient documentation

## 2014-10-01 ENCOUNTER — Other Ambulatory Visit: Payer: Self-pay | Admitting: Internal Medicine

## 2014-10-15 ENCOUNTER — Other Ambulatory Visit: Payer: Self-pay | Admitting: Cardiovascular Disease

## 2014-10-16 ENCOUNTER — Encounter: Payer: Self-pay | Admitting: Internal Medicine

## 2014-10-21 ENCOUNTER — Ambulatory Visit (INDEPENDENT_AMBULATORY_CARE_PROVIDER_SITE_OTHER): Payer: Medicare Other

## 2014-10-21 DIAGNOSIS — I4891 Unspecified atrial fibrillation: Secondary | ICD-10-CM

## 2014-10-21 DIAGNOSIS — Z5181 Encounter for therapeutic drug level monitoring: Secondary | ICD-10-CM

## 2014-10-21 DIAGNOSIS — Z7901 Long term (current) use of anticoagulants: Secondary | ICD-10-CM

## 2014-10-21 LAB — POCT INR: INR: 2.7

## 2014-10-29 ENCOUNTER — Telehealth: Payer: Self-pay | Admitting: *Deleted

## 2014-10-29 NOTE — Telephone Encounter (Signed)
Patient's surgery has been scheduled for 11-19-14 at [facility]. This patient will need to discontinue coumadin 4 days prior to procedure.   Verbal instructions were reviewed with the patient's daughter, Richmond Campbell. Paperwork has also been mailed to patient's daughter.   Patient's daughter confirmed that patient has not had a stroke or TIA.

## 2014-11-05 LAB — PROTIME-INR

## 2014-11-06 ENCOUNTER — Inpatient Hospital Stay: Payer: Self-pay | Admitting: Internal Medicine

## 2014-11-06 DIAGNOSIS — Z01810 Encounter for preprocedural cardiovascular examination: Secondary | ICD-10-CM

## 2014-11-06 DIAGNOSIS — I10 Essential (primary) hypertension: Secondary | ICD-10-CM

## 2014-11-06 LAB — COMPREHENSIVE METABOLIC PANEL
ALK PHOS: 281 U/L — AB
ALT: 143 U/L — AB
AST: 135 U/L — AB (ref 15–37)
AST: 225 U/L — AB (ref 15–37)
Albumin: 2.8 g/dL — ABNORMAL LOW (ref 3.4–5.0)
Albumin: 3.3 g/dL — ABNORMAL LOW (ref 3.4–5.0)
Alkaline Phosphatase: 365 U/L — ABNORMAL HIGH
Anion Gap: 12 (ref 7–16)
Anion Gap: 9 (ref 7–16)
BILIRUBIN TOTAL: 7.6 mg/dL — AB (ref 0.2–1.0)
BUN: 14 mg/dL (ref 7–18)
BUN: 16 mg/dL (ref 7–18)
Bilirubin,Total: 6.3 mg/dL — ABNORMAL HIGH (ref 0.2–1.0)
CREATININE: 1.18 mg/dL (ref 0.60–1.30)
Calcium, Total: 10 mg/dL (ref 8.5–10.1)
Calcium, Total: 9.2 mg/dL (ref 8.5–10.1)
Chloride: 97 mmol/L — ABNORMAL LOW (ref 98–107)
Chloride: 98 mmol/L (ref 98–107)
Co2: 25 mmol/L (ref 21–32)
Co2: 27 mmol/L (ref 21–32)
Creatinine: 1.12 mg/dL (ref 0.60–1.30)
EGFR (African American): 56 — ABNORMAL LOW
EGFR (African American): 60 — ABNORMAL LOW
EGFR (Non-African Amer.): 46 — ABNORMAL LOW
EGFR (Non-African Amer.): 49 — ABNORMAL LOW
Glucose: 118 mg/dL — ABNORMAL HIGH (ref 65–99)
Glucose: 141 mg/dL — ABNORMAL HIGH (ref 65–99)
OSMOLALITY: 272 (ref 275–301)
Osmolality: 270 (ref 275–301)
Potassium: 3.5 mmol/L (ref 3.5–5.1)
Potassium: 3.5 mmol/L (ref 3.5–5.1)
SGPT (ALT): 167 U/L — ABNORMAL HIGH
Sodium: 134 mmol/L — ABNORMAL LOW (ref 136–145)
Sodium: 134 mmol/L — ABNORMAL LOW (ref 136–145)
TOTAL PROTEIN: 6.3 g/dL — AB (ref 6.4–8.2)
Total Protein: 5.6 g/dL — ABNORMAL LOW (ref 6.4–8.2)

## 2014-11-06 LAB — URINALYSIS, COMPLETE
BACTERIA: NONE SEEN
Blood: NEGATIVE
Glucose,UR: NEGATIVE mg/dL (ref 0–75)
Hyaline Cast: 2
Ketone: NEGATIVE
LEUKOCYTE ESTERASE: NEGATIVE
Nitrite: NEGATIVE
PROTEIN: NEGATIVE
Ph: 7 (ref 4.5–8.0)
RBC,UR: 3 /HPF (ref 0–5)
Specific Gravity: 1.016 (ref 1.003–1.030)
Squamous Epithelial: 1

## 2014-11-06 LAB — CBC
HCT: 32.9 % — AB (ref 35.0–47.0)
HGB: 11.1 g/dL — ABNORMAL LOW (ref 12.0–16.0)
MCH: 33.7 pg (ref 26.0–34.0)
MCHC: 33.7 g/dL (ref 32.0–36.0)
MCV: 100 fL (ref 80–100)
Platelet: 138 10*3/uL — ABNORMAL LOW (ref 150–440)
RBC: 3.29 10*6/uL — AB (ref 3.80–5.20)
RDW: 12.7 % (ref 11.5–14.5)
WBC: 21 10*3/uL — ABNORMAL HIGH (ref 3.6–11.0)

## 2014-11-06 LAB — LIPASE, BLOOD
LIPASE: 534 U/L — AB (ref 73–393)
Lipase: 935 U/L — ABNORMAL HIGH (ref 73–393)

## 2014-11-06 LAB — PROTIME-INR
INR: 1.4
PROTHROMBIN TIME: 17.3 s — AB (ref 11.5–14.7)

## 2014-11-07 LAB — BASIC METABOLIC PANEL
ANION GAP: 8 (ref 7–16)
BUN: 16 mg/dL (ref 7–18)
CALCIUM: 8.7 mg/dL (ref 8.5–10.1)
CHLORIDE: 102 mmol/L (ref 98–107)
CREATININE: 1.11 mg/dL (ref 0.60–1.30)
Co2: 27 mmol/L (ref 21–32)
EGFR (Non-African Amer.): 50 — ABNORMAL LOW
GLUCOSE: 78 mg/dL (ref 65–99)
OSMOLALITY: 274 (ref 275–301)
Potassium: 3.4 mmol/L — ABNORMAL LOW (ref 3.5–5.1)
SODIUM: 137 mmol/L (ref 136–145)

## 2014-11-07 LAB — HEPATIC FUNCTION PANEL A (ARMC)
ALK PHOS: 235 U/L — AB
ALT: 107 U/L — AB
AST: 82 U/L — AB (ref 15–37)
Albumin: 2.5 g/dL — ABNORMAL LOW (ref 3.4–5.0)
Bilirubin, Direct: 5.3 mg/dL — ABNORMAL HIGH (ref 0.0–0.2)
Bilirubin,Total: 7 mg/dL — ABNORMAL HIGH (ref 0.2–1.0)
Total Protein: 5.2 g/dL — ABNORMAL LOW (ref 6.4–8.2)

## 2014-11-07 LAB — CBC WITH DIFFERENTIAL/PLATELET
Basophil #: 0 10*3/uL (ref 0.0–0.1)
Basophil %: 0.2 %
EOS PCT: 0.2 %
Eosinophil #: 0 10*3/uL (ref 0.0–0.7)
HCT: 27.1 % — ABNORMAL LOW (ref 35.0–47.0)
HGB: 9.4 g/dL — ABNORMAL LOW (ref 12.0–16.0)
Lymphocyte #: 0.9 10*3/uL — ABNORMAL LOW (ref 1.0–3.6)
Lymphocyte %: 11.2 %
MCH: 34.4 pg — AB (ref 26.0–34.0)
MCHC: 34.5 g/dL (ref 32.0–36.0)
MCV: 100 fL (ref 80–100)
MONOS PCT: 6.3 %
Monocyte #: 0.5 x10 3/mm (ref 0.2–0.9)
NEUTROS PCT: 82.1 %
Neutrophil #: 6.8 10*3/uL — ABNORMAL HIGH (ref 1.4–6.5)
Platelet: 95 10*3/uL — ABNORMAL LOW (ref 150–440)
RBC: 2.73 10*6/uL — ABNORMAL LOW (ref 3.80–5.20)
RDW: 13.1 % (ref 11.5–14.5)
WBC: 8.3 10*3/uL (ref 3.6–11.0)

## 2014-11-07 LAB — LIPASE, BLOOD: Lipase: 1102 U/L — ABNORMAL HIGH (ref 73–393)

## 2014-11-08 LAB — COMPREHENSIVE METABOLIC PANEL
ALBUMIN: 2.5 g/dL — AB (ref 3.4–5.0)
ANION GAP: 10 (ref 7–16)
Alkaline Phosphatase: 237 U/L — ABNORMAL HIGH
BUN: 16 mg/dL (ref 7–18)
Bilirubin,Total: 3.7 mg/dL — ABNORMAL HIGH (ref 0.2–1.0)
CALCIUM: 8.8 mg/dL (ref 8.5–10.1)
CHLORIDE: 104 mmol/L (ref 98–107)
Co2: 23 mmol/L (ref 21–32)
Creatinine: 0.94 mg/dL (ref 0.60–1.30)
EGFR (African American): >60
EGFR (Non-African Amer.): >60
Glucose: 68 mg/dL (ref 65–99)
Osmolality: 273 (ref 275–301)
POTASSIUM: 3.5 mmol/L (ref 3.5–5.1)
SGOT(AST): 43 U/L — ABNORMAL HIGH (ref 15–37)
SGPT (ALT): 78 U/L — ABNORMAL HIGH
Sodium: 137 mmol/L (ref 136–145)
TOTAL PROTEIN: 5.3 g/dL — AB (ref 6.4–8.2)

## 2014-11-08 LAB — LIPASE, BLOOD: LIPASE: 776 U/L — AB (ref 73–393)

## 2014-11-09 ENCOUNTER — Telehealth: Payer: Self-pay | Admitting: *Deleted

## 2014-11-09 DIAGNOSIS — K8033 Calculus of bile duct with acute cholangitis with obstruction: Secondary | ICD-10-CM | POA: Diagnosis not present

## 2014-11-09 LAB — CBC WITH DIFFERENTIAL/PLATELET
Basophil #: 0 10*3/uL (ref 0.0–0.1)
Basophil %: 0.6 %
EOS PCT: 1.6 %
Eosinophil #: 0.1 10*3/uL (ref 0.0–0.7)
HCT: 27.3 % — AB (ref 35.0–47.0)
HGB: 9.2 g/dL — ABNORMAL LOW (ref 12.0–16.0)
Lymphocyte #: 0.7 10*3/uL — ABNORMAL LOW (ref 1.0–3.6)
Lymphocyte %: 13.7 %
MCH: 33.7 pg (ref 26.0–34.0)
MCHC: 33.8 g/dL (ref 32.0–36.0)
MCV: 100 fL (ref 80–100)
MONO ABS: 0.5 x10 3/mm (ref 0.2–0.9)
Monocyte %: 8.7 %
NEUTROS ABS: 4.1 10*3/uL (ref 1.4–6.5)
NEUTROS PCT: 75.4 %
Platelet: 125 10*3/uL — ABNORMAL LOW (ref 150–440)
RBC: 2.73 10*6/uL — ABNORMAL LOW (ref 3.80–5.20)
RDW: 12.8 % (ref 11.5–14.5)
WBC: 5.4 10*3/uL (ref 3.6–11.0)

## 2014-11-09 LAB — COMPREHENSIVE METABOLIC PANEL
Albumin: 2.3 g/dL — ABNORMAL LOW (ref 3.4–5.0)
Alkaline Phosphatase: 205 U/L — ABNORMAL HIGH
Anion Gap: 10 (ref 7–16)
BILIRUBIN TOTAL: 2.8 mg/dL — AB (ref 0.2–1.0)
BUN: 12 mg/dL (ref 7–18)
CO2: 24 mmol/L (ref 21–32)
CREATININE: 0.87 mg/dL (ref 0.60–1.30)
Calcium, Total: 8.8 mg/dL (ref 8.5–10.1)
Chloride: 103 mmol/L (ref 98–107)
EGFR (African American): >60
Glucose: 87 mg/dL (ref 65–99)
OSMOLALITY: 273 (ref 275–301)
Potassium: 3.2 mmol/L — ABNORMAL LOW (ref 3.5–5.1)
SGOT(AST): 31 U/L (ref 15–37)
SGPT (ALT): 61 U/L
Sodium: 137 mmol/L (ref 136–145)
Total Protein: 5.4 g/dL — ABNORMAL LOW (ref 6.4–8.2)

## 2014-11-09 LAB — PROTIME-INR
INR: 1.1
Prothrombin Time: 14.5 secs (ref 11.5–14.7)

## 2014-11-09 LAB — LIPASE, BLOOD: Lipase: 583 U/L — ABNORMAL HIGH (ref 73–393)

## 2014-11-09 NOTE — Telephone Encounter (Signed)
-----   Message from Robert Bellow, MD sent at 11/09/2014  1:16 PM EST ----- The patient has been admitted [facility], and will not be coming in tomorrow for her scheduled appointment. Please cancel the planned cholecystectomy for next month.

## 2014-11-09 NOTE — Telephone Encounter (Signed)
Surgery has been cancelled for 11-19-14 with [name] [name] in the [facility]  Message left on Scheduling line to cancel the pre-admit appointment that is scheduled at [facility] for 11-10-14 at 1:45 pm. [name] [name] in [place] has also been notified of cancellation.   Pre-op appointment that was scheduled in the office for tomorrow at 11 am has also been cancelled.

## 2014-11-10 ENCOUNTER — Ambulatory Visit: Admit: 2014-11-10 | Disposition: A | Discharge status: Home / Self Care | Payer: Self-pay | Admitting: General Surgery

## 2014-11-10 ENCOUNTER — Encounter: Payer: Self-pay | Admitting: General Surgery

## 2014-11-10 ENCOUNTER — Ambulatory Visit: Payer: Medicare Other | Admitting: General Surgery

## 2014-11-10 DIAGNOSIS — K8033 Calculus of bile duct with acute cholangitis with obstruction: Secondary | ICD-10-CM | POA: Diagnosis not present

## 2014-11-10 DIAGNOSIS — K851 Biliary acute pancreatitis without necrosis or infection: Secondary | ICD-10-CM

## 2014-11-10 HISTORY — PX: CHOLECYSTECTOMY: SHX55

## 2014-11-10 HISTORY — DX: Biliary acute pancreatitis without necrosis or infection: K85.10

## 2014-11-10 LAB — LIPASE, BLOOD: Lipase: 250 U/L (ref 73–393)

## 2014-11-10 LAB — COMPREHENSIVE METABOLIC PANEL
Albumin: 2.3 g/dL — ABNORMAL LOW (ref 3.4–5.0)
Alkaline Phosphatase: 179 U/L — ABNORMAL HIGH
Anion Gap: 11 (ref 7–16)
BILIRUBIN TOTAL: 1.9 mg/dL — AB (ref 0.2–1.0)
BUN: 13 mg/dL (ref 7–18)
CALCIUM: 8.4 mg/dL — AB (ref 8.5–10.1)
CHLORIDE: 105 mmol/L (ref 98–107)
Co2: 22 mmol/L (ref 21–32)
Creatinine: 0.87 mg/dL (ref 0.60–1.30)
EGFR (African American): >60
EGFR (Non-African Amer.): >60
Glucose: 80 mg/dL (ref 65–99)
OSMOLALITY: 275 (ref 275–301)
Potassium: 3.2 mmol/L — ABNORMAL LOW (ref 3.5–5.1)
SGOT(AST): 32 U/L (ref 15–37)
SGPT (ALT): 50 U/L
SODIUM: 138 mmol/L (ref 136–145)
Total Protein: 5.2 g/dL — ABNORMAL LOW (ref 6.4–8.2)

## 2014-11-11 ENCOUNTER — Encounter: Payer: Self-pay | Admitting: General Surgery

## 2014-11-11 LAB — BASIC METABOLIC PANEL
Anion Gap: 10 (ref 7–16)
BUN: 17 mg/dL (ref 7–18)
Calcium, Total: 8.6 mg/dL (ref 8.5–10.1)
Chloride: 104 mmol/L (ref 98–107)
Co2: 22 mmol/L (ref 21–32)
Creatinine: 0.95 mg/dL (ref 0.60–1.30)
EGFR (Non-African Amer.): 60 — ABNORMAL LOW
Glucose: 99 mg/dL (ref 65–99)
Osmolality: 274 (ref 275–301)
Potassium: 4.3 mmol/L (ref 3.5–5.1)
SODIUM: 136 mmol/L (ref 136–145)

## 2014-11-11 LAB — HEPATIC FUNCTION PANEL A (ARMC)
ALBUMIN: 2.5 g/dL — AB (ref 3.4–5.0)
ALT: 62 U/L
Alkaline Phosphatase: 169 U/L — ABNORMAL HIGH
Bilirubin, Direct: 0.8 mg/dL — ABNORMAL HIGH (ref 0.0–0.2)
Bilirubin,Total: 1.5 mg/dL — ABNORMAL HIGH (ref 0.2–1.0)
SGOT(AST): 54 U/L — ABNORMAL HIGH (ref 15–37)
Total Protein: 5.5 g/dL — ABNORMAL LOW (ref 6.4–8.2)

## 2014-11-11 LAB — LIPASE, BLOOD: Lipase: 171 U/L (ref 73–393)

## 2014-11-12 ENCOUNTER — Encounter: Payer: Self-pay | Admitting: General Surgery

## 2014-11-17 ENCOUNTER — Telehealth: Payer: Self-pay | Admitting: *Deleted

## 2014-11-17 NOTE — Telephone Encounter (Signed)
I have tried to reach patient on home phone since this morning (line busy) & tried cellphone (voice mailbox not set up). This patient has been scheduled to see Morey Hummingbird on 11/25/14 for a Hospital f/u & should be seen by Dr. Nicki Reaper instead. Pt also has a hospital f/u scheduled on 11/24/14 with Dr. Bary Castilla. She had surgery on 11/10/14 (Laparoscopic cholescystectomy with intraoperative cholangiogram). We need to know if patient would be willing to reschedule with Dr. Nicki Reaper for a follow up or if she prefers to just see Dr. Bary Castilla only. If patient would like to be seen by Dr. Nicki Reaper, please advise on a date & time (11/26/14 @ 11:45 was written down to offer to two different patients so it has already been taken).

## 2014-11-18 NOTE — Telephone Encounter (Signed)
I can see her at 12:00 on 11/27/14.

## 2014-11-18 NOTE — Telephone Encounter (Signed)
Spoke with patient & she agreed to be seen on 1/15 @ 12 by Dr. Nicki Reaper. She also reports that she is doing better & if she will call to cancel f/u appt after she Dr. Bary Castilla if she doesn't need it.

## 2014-11-19 ENCOUNTER — Ambulatory Visit: Admit: 2014-11-19 | Disposition: A | Discharge status: Home / Self Care | Payer: Self-pay | Admitting: General Surgery

## 2014-11-24 ENCOUNTER — Ambulatory Visit (INDEPENDENT_AMBULATORY_CARE_PROVIDER_SITE_OTHER): Payer: Self-pay | Admitting: General Surgery

## 2014-11-24 ENCOUNTER — Encounter: Payer: Self-pay | Admitting: General Surgery

## 2014-11-24 VITALS — BP 150/70 | HR 92 | Resp 14 | Ht 65.5 in | Wt 151.0 lb

## 2014-11-24 DIAGNOSIS — K8033 Calculus of bile duct with acute cholangitis with obstruction: Secondary | ICD-10-CM

## 2014-11-24 DIAGNOSIS — K8020 Calculus of gallbladder without cholecystitis without obstruction: Secondary | ICD-10-CM

## 2014-11-24 NOTE — Progress Notes (Signed)
Patient ID: [name] [name], female   DOB: 1928/04/28, 81 y.o.   MRN: [id number]  Chief Complaint  Patient presents with  . Routine Post Op    HPI [name] [name] is a 81 y.o. female.  Here today for postoperative visit, laparoscopic cholecystectomy done 11-10-14. States she is doing well. No nausea or vomiting. She states "I don't feel like myself today". Denies dizziness, light headness or any other symptoms. She states the upper left lip still feels a little "numb". This has been present since her ERCP in the hospital. It is slowly resolving. No trouble chewing or swallowing.She denies any other motor weakness. No sensory deficit. Her daughter, who accompanied her today, reports no abnormalities that she has been aware of.  The patient was reluctant to consider elective surgery in November presented with cholangitis and sepsis that responded to IV antibiotics. She subsequently underwent ERCP that did not demonstrate any persistent stones. Cholecystectomy was completed during that hospitalization.  HPI  Past Medical History  Diagnosis Date  . Osteoarthritis   . Hypertension   . GERD (gastroesophageal reflux disease)   . Hyperlipidemia   . Cholelithiasis   . Chronic kidney disease   . Impaired fasting glucose   . Atrial fibrillation   . Carotid stenosis   . Chronic low back pain   . Gallstone pancreatitis 11-10-14    Past Surgical History  Procedure Laterality Date  . Cataract extraction      x2  . Appendectomy    . Cholecystectomy  11-10-14    Family History  Problem Relation Age of Onset  . Atrial fibrillation Sister   . Atrial fibrillation Brother   . Heart attack Father 11  . Heart failure Sister 71  . Stroke Sister     Social History History  Substance Use Topics  . Smoking status: Former Smoker -- 0.50 packs/day    Types: Cigarettes    Quit date: 11/04/2014  . Smokeless tobacco: Never Used  . Alcohol Use: 0.0 oz/week    0 Not specified per week      Comment: occasional    No Known Allergies  Current Outpatient Prescriptions  Medication Sig Dispense Refill  . aspirin 81 MG EC tablet Take 81 mg by mouth daily.      . carvedilol (COREG) 25 MG tablet Take 1 tablet (25 mg total) by mouth 2 (two) times daily with a meal. 180 tablet 3  . clonazePAM (KLONOPIN) 0.5 MG tablet 1/2 - 1 tablet q day prn 30 tablet 0  . hydrALAZINE (APRESOLINE) 10 MG tablet Take 1 tablet (10 mg total) by mouth 3 (three) times daily. 90 tablet 5  . losartan (COZAAR) 100 MG tablet TAKE 1 TABLET BY MOUTH DAILY. 90 tablet 1  . omeprazole (PRILOSEC) 20 MG capsule Take 20 mg by mouth daily.    . pravastatin (PRAVACHOL) 40 MG tablet TAKE 1 TABLET (40 MG TOTAL) BY MOUTH DAILY. 90 tablet 3  . warfarin (COUMADIN) 5 MG tablet Take as directed by anticoagulation clinic 30 tablet 3   No current facility-administered medications for this visit.    Review of Systems Review of Systems  Constitutional: Negative.   Respiratory: Negative.   Gastrointestinal: Negative for nausea and vomiting.  Neurological: Negative for dizziness and light-headedness.    Blood pressure 150/70, pulse 92, resp. rate 14, height 5' 5.5" (1.664 m), weight 151 lb (68.493 kg), SpO2 98 %.  Physical Exam Physical Exam  Constitutional: She is oriented to person, place, and time. She appears  well-developed and well-nourished.  Cardiovascular: Normal rate, regular rhythm and normal heart sounds.   Pulmonary/Chest: Effort normal and breath sounds normal.  Abdominal: Soft. Normal appearance and bowel sounds are normal.  Port sites well healed.  Neurological: She is alert and oriented to person, place, and time. She has normal strength. No cranial nerve deficit.  Neurologically WNL. Normal balance and cerebellar function noted on testing. Symmetrical muscle strength  Skin: Skin is warm and dry.    Data Reviewed Pathology of the gallbladder showed Acute andchronic cholecystitis and  cholelithiasis.  Assessment    Doing well status post cholecystectomy.     Plan    The patient will increase her activities as tolerated. A post discharge appointment had been scheduled with Dr. Nicki Reaper. I've notify the patient that she does not need to keep this appointment, but should keep her routine follow-up scheduled with the coagulation clinic in the cardiology office next week as scheduled and to continue her other routine visits with her PCP.      Follow up as needed. The patient is aware to call back for any questions or concerns.  PCP/Ref:  Einar Pheasant Dr. Juanita Laster, [place] Gleason 11/25/2014, 6:49 AM

## 2014-11-24 NOTE — Patient Instructions (Signed)
The patient is aware to call back for any questions or concerns.  

## 2014-11-25 ENCOUNTER — Ambulatory Visit: Payer: Medicare Other | Admitting: Nurse Practitioner

## 2014-11-25 DIAGNOSIS — K8033 Calculus of bile duct with acute cholangitis with obstruction: Secondary | ICD-10-CM | POA: Insufficient documentation

## 2014-11-27 ENCOUNTER — Ambulatory Visit: Payer: Self-pay | Admitting: Internal Medicine

## 2014-12-05 ENCOUNTER — Other Ambulatory Visit: Payer: Self-pay | Admitting: Internal Medicine

## 2014-12-18 ENCOUNTER — Encounter: Payer: Self-pay | Admitting: Cardiovascular Disease

## 2014-12-23 ENCOUNTER — Ambulatory Visit (INDEPENDENT_AMBULATORY_CARE_PROVIDER_SITE_OTHER): Payer: Medicare Other | Admitting: Pharmacist

## 2014-12-23 DIAGNOSIS — Z7901 Long term (current) use of anticoagulants: Secondary | ICD-10-CM | POA: Diagnosis not present

## 2014-12-23 DIAGNOSIS — Z5181 Encounter for therapeutic drug level monitoring: Secondary | ICD-10-CM

## 2014-12-23 DIAGNOSIS — I4891 Unspecified atrial fibrillation: Secondary | ICD-10-CM

## 2014-12-23 LAB — POCT INR: INR: 3.2

## 2015-01-07 ENCOUNTER — Telehealth: Payer: Self-pay | Admitting: *Deleted

## 2015-01-07 NOTE — Telephone Encounter (Signed)
Lmom to call our office. Time to schedule a 6 mth f/u carotid u/s.

## 2015-01-20 ENCOUNTER — Ambulatory Visit (INDEPENDENT_AMBULATORY_CARE_PROVIDER_SITE_OTHER): Payer: Medicare Other | Admitting: Cardiovascular Disease

## 2015-01-20 ENCOUNTER — Ambulatory Visit (INDEPENDENT_AMBULATORY_CARE_PROVIDER_SITE_OTHER): Payer: Medicare Other

## 2015-01-20 ENCOUNTER — Encounter: Payer: Self-pay | Admitting: Cardiovascular Disease

## 2015-01-20 VITALS — BP 150/80 | HR 78 | Ht 65.5 in | Wt 147.2 lb

## 2015-01-20 DIAGNOSIS — Z5181 Encounter for therapeutic drug level monitoring: Secondary | ICD-10-CM

## 2015-01-20 DIAGNOSIS — I4891 Unspecified atrial fibrillation: Secondary | ICD-10-CM | POA: Diagnosis not present

## 2015-01-20 DIAGNOSIS — I6523 Occlusion and stenosis of bilateral carotid arteries: Secondary | ICD-10-CM | POA: Diagnosis not present

## 2015-01-20 DIAGNOSIS — Z7901 Long term (current) use of anticoagulants: Secondary | ICD-10-CM

## 2015-01-20 DIAGNOSIS — I10 Essential (primary) hypertension: Secondary | ICD-10-CM

## 2015-01-20 DIAGNOSIS — E785 Hyperlipidemia, unspecified: Secondary | ICD-10-CM

## 2015-01-20 DIAGNOSIS — Z720 Tobacco use: Secondary | ICD-10-CM

## 2015-01-20 DIAGNOSIS — IMO0001 Reserved for inherently not codable concepts without codable children: Secondary | ICD-10-CM

## 2015-01-20 DIAGNOSIS — F17200 Nicotine dependence, unspecified, uncomplicated: Secondary | ICD-10-CM

## 2015-01-20 LAB — POCT INR: INR: 2.4

## 2015-01-20 NOTE — Assessment & Plan Note (Signed)
We have encouraged her to continue to work on weaning her cigarettes and smoking cessation. She will continue to work on this and does not want any assistance with chantix.  

## 2015-01-20 NOTE — Assessment & Plan Note (Signed)
Blood pressures running mildly high today. Encouraged her to stay on her chlorthalidone given continued ankle edema After edema has resolved, would decrease the chlorthalidone down to one half pill daily

## 2015-01-20 NOTE — Assessment & Plan Note (Signed)
Heart rate well controlled, tolerating anticoagulation. No medication changes made

## 2015-01-20 NOTE — Assessment & Plan Note (Signed)
She does not want repeat carotid ultrasound at this time. Previously noted to be 60-79% bilaterally. Suggested recheck in with her again at the end of the year, consider repeat ultrasound early 2017. Stressed importance of smoking cessation

## 2015-01-20 NOTE — Patient Instructions (Addendum)
You are doing well. No medication changes were made.  Please cut back on the chlorthalidone to 1/2 pill once the leg swelling goes away Add some potassium to your diet  We can do carotid u/s later in the year  Please call us if you have new issues that need to be addressed before your next appt.  Your physician wants you to follow-up in: 6 months.  You will receive a reminder letter in the mail two months in advance. If you don't receive a letter, please call our office to schedule the follow-up appointment.

## 2015-01-20 NOTE — Assessment & Plan Note (Signed)
Cholesterol is at goal on the current lipid regimen. No changes to the medications were made.  

## 2015-01-20 NOTE — Progress Notes (Signed)
Patient ID: [name] [name], female    DOB: 15-Sep-1928, 81 y.o.   MRN: [id number]  HPI Comments: 81 yo with history of chronic atrial fibrillation, HTN, 60-79% bilateral carotid stenosis, continued smoking presents for followup of her carotid disease and atrial fibrillation.    In follow-up today, she reports having recent laparoscopic cholecystectomy. Following the surgery she has had significant lower extremity edema. Initially chlorthalidone was held. She has restarted this given her fluid overload with mild improvement of her symptoms, now just swelling around her ankles, still pitting. She continues to smoke Otherwise reports that she is doing much better. Denies any abdominal pain She's not monitoring her blood pressure at home Not interested in carotid ultrasound right at this time given she's had significant hospital procedures and is still recovering  EKG on today's visit shows atrial fibrillation with ventricular rate 78 bpm, no significant ST or T-wave changes  Other past medical history Carotid ultrasound showing 60-79 percent bilateral disease in April 2015 Cholesterol is less than 150   Previous blood work last year showed elevated creatinine. This has improved on lower chlorthalidone  Echo in 7/11 showed preserved EF with mild mitral regurgitaiton and mild pulmonary hypertension (PA systolic pressure 42 mmHg).       No Known Allergies  Outpatient Encounter Prescriptions as of 01/20/2015  Medication Sig  . aspirin 81 MG EC tablet Take 81 mg by mouth daily.    . carvedilol (COREG) 25 MG tablet Take 1 tablet (25 mg total) by mouth 2 (two) times daily with a meal.  . chlorthalidone (HYGROTON) 25 MG tablet Take 25 mg by mouth daily.   . clonazePAM (KLONOPIN) 0.5 MG tablet 1/2 - 1 tablet q day prn  . hydrALAZINE (APRESOLINE) 10 MG tablet TAKE 1 TABLET BY MOUTH 3 TIMES A DAY  . losartan (COZAAR) 100 MG tablet TAKE 1 TABLET BY MOUTH DAILY.  Marland Kitchen omeprazole (PRILOSEC) 20 MG  capsule Take 20 mg by mouth daily.  . pravastatin (PRAVACHOL) 40 MG tablet TAKE 1 TABLET (40 MG TOTAL) BY MOUTH DAILY.  Marland Kitchen warfarin (COUMADIN) 5 MG tablet Take as directed by anticoagulation clinic    Past Medical History  Diagnosis Date  . Osteoarthritis   . Hypertension   . GERD (gastroesophageal reflux disease)   . Hyperlipidemia   . Cholelithiasis   . Chronic kidney disease   . Impaired fasting glucose   . Atrial fibrillation   . Carotid stenosis   . Chronic low back pain   . Gallstone pancreatitis 11-10-14  . Gallstones     Past Surgical History  Procedure Laterality Date  . Cataract extraction      x2  . Appendectomy    . Cholecystectomy  11-10-14  . Cholecystectomy      Social History  reports that she quit smoking about 2 months ago. Her smoking use included Cigarettes. She smoked 0.50 packs per day. She has never used smokeless tobacco. She reports that she drinks alcohol. She reports that she does not use illicit drugs.  Family History family history includes Atrial fibrillation in her brother and sister; Heart attack (age of onset: 1) in her father; Heart failure (age of onset: 16) in her sister; Stroke in her sister.   Review of Systems  Constitutional: Negative.   Respiratory: Negative.   Cardiovascular: Negative.   Gastrointestinal: Negative.   Musculoskeletal: Negative.   Skin: Negative.   Neurological: Negative.   Hematological: Negative.   Psychiatric/Behavioral: Negative.   All other systems  reviewed and are negative.  BP 150/80 mmHg  Pulse 78  Ht 5' 5.5" (1.664 m)  Wt 147 lb 4 oz (66.792 kg)  BMI 24.12 kg/m2  Physical Exam  Constitutional: She is oriented to person, place, and time. She appears well-developed and well-nourished.  HENT:  Head: Normocephalic.  Nose: Nose normal.  Mouth/Throat: Oropharynx is clear and moist.  Eyes: Conjunctivae are normal. Pupils are equal, round, and reactive to light.  Neck: Normal range of motion. Neck  supple. No JVD present. Carotid bruit is present.  Cardiovascular: Normal rate, regular rhythm, S1 normal, S2 normal and intact distal pulses.  Exam reveals no gallop and no friction rub.   Murmur heard.  Crescendo systolic murmur is present with a grade of 2/6  Trace edema around the ankles bilaterally  Pulmonary/Chest: Effort normal and breath sounds normal. No respiratory distress. She has no wheezes. She has no rales. She exhibits no tenderness.  Abdominal: Soft. Bowel sounds are normal. She exhibits no distension. There is no tenderness.  Musculoskeletal: Normal range of motion. She exhibits no edema or tenderness.  Lymphadenopathy:    She has no cervical adenopathy.  Neurological: She is alert and oriented to person, place, and time. Coordination normal.  Skin: Skin is warm and dry. No rash noted. No erythema.  Psychiatric: She has a normal mood and affect. Her behavior is normal. Judgment and thought content normal.    Assessment and Plan  Nursing note and vitals reviewed.

## 2015-02-12 ENCOUNTER — Other Ambulatory Visit: Payer: Self-pay | Admitting: Cardiovascular Disease

## 2015-02-12 NOTE — Telephone Encounter (Signed)
Please review for refill. Thanks!  

## 2015-02-17 ENCOUNTER — Ambulatory Visit (INDEPENDENT_AMBULATORY_CARE_PROVIDER_SITE_OTHER): Payer: Medicare Other

## 2015-02-17 DIAGNOSIS — Z7901 Long term (current) use of anticoagulants: Secondary | ICD-10-CM

## 2015-02-17 DIAGNOSIS — I4891 Unspecified atrial fibrillation: Secondary | ICD-10-CM

## 2015-02-17 DIAGNOSIS — Z5181 Encounter for therapeutic drug level monitoring: Secondary | ICD-10-CM | POA: Diagnosis not present

## 2015-02-17 LAB — POCT INR: INR: 2.2

## 2015-03-06 NOTE — Consult Note (Signed)
ERCP showed dilated CBD but no obvious filling defect. Biliary sphincterotomy done with excellent bile drainage afterwards. No obvious stone seen with balloon sweeps. Clear liquid diet ordered. Continue to moniter LFT. Pt can have GB surgery anytime surgery deemed appropriate. thanks.  Electronic Signatures: Verdie Shire (MD)  (Signed on 28-Dec-15 12:22)  Authored  Last Updated: 28-Dec-15 12:22 by Verdie Shire (MD)

## 2015-03-06 NOTE — Consult Note (Signed)
Chief Complaint:  Subjective/Chief Complaint Covering for Dr. Vira Agar. No abdominal pain today. Had some breakfast this AM.   VITAL SIGNS/ANCILLARY NOTES: **Vital Signs.:   27-Dec-15 09:20  Vital Signs Type Recheck  Systolic BP Systolic BP 196  Diastolic BP (mmHg) Diastolic BP (mmHg) 70  Mean BP 105  BP Source  if not from Vital Sign Device manual   Brief Assessment:  GEN no acute distress, jaundiced   Cardiac Regular   Respiratory normal resp effort   Gastrointestinal details normal Soft  Nontender  Nondistended  No masses palpable  Bowel sounds normal  No rebound tenderness  No gaurding  No rigidity   Lab Results: Hepatic:  27-Dec-15 04:05   Bilirubin, Total  3.7  Alkaline Phosphatase  237 (46-116 NOTE: New Reference Range 06/02/14)  SGPT (ALT)  78 (14-63 NOTE: New Reference Range 06/02/14)  SGOT (AST)  43  Total Protein, Serum  5.3  Albumin, Serum  2.5  Routine Chem:  27-Dec-15 04:05   Glucose, Serum 68  BUN 16  Creatinine (comp) 0.94  Sodium, Serum 137  Potassium, Serum 3.5  Chloride, Serum 104  CO2, Serum 23  Calcium (Total), Serum 8.8  Osmolality (calc) 273  eGFR (African American) >60  eGFR (Non-African American) >60 (eGFR values <24m/min/1.73 m2 may be an indication of chronic kidney disease (CKD). Calculated eGFR, using the MRDR Study equation, is useful in  patients with stable renal function. The eGFR calculation will not be reliable in acutely ill patients when serum creatinine is changing rapidly. It is not useful in patients on dialysis. The eGFR calculation may not be applicable to patients at the low and high extremes of body sizes, pregnant women, and vegetarians.)  Anion Gap 10  Lipase  776 (Result(s) reported on 08 Nov 2014 at 04:36AM.)   Radiology Results: MRI:    25-Dec-15 11:07, MRCP MR Cholangiogram  MRCP MR Cholangiogram   REASON FOR EXAM:    cholelithiasis, CBD stone  COMMENTS:       PROCEDURE: MR  - MR MRCP  - Nov 06 2014  11:07AM     CLINICAL DATA:  Abdominal pain. Nausea. Postprandial vomiting for 3  months, but worsening over the last 2 days. Possible sepsis.    EXAM:  MRI ABDOMEN WITHOUT AND WITH CONTRAST (INCLUDING MRCP)    TECHNIQUE:  Multiplanar multisequence MR imaging of the abdomen was performed  both before and after the administration of intravenous contrast.  Heavily T2-weighted images of the biliary and pancreatic ducts were  obtained, and three-dimensional MRCP images were rendered by post  processing.    CONTRAST:  14 cc MultiHance    COMPARISON:  11/06/2014    FINDINGS:  Lower chest:  Moderate-sized hiatal hernia.    Hepatobiliary: 7 mm filling defect compatible with stones lodged in  the distal CBD near the ampulla. As result, the common bile duct is  dilated to 1.1 cm, and there is some distention of the cystic duct  as well as mild intrahepatic biliary dilatation. There are 2 other  stones in the gallbladder, each in the 8 mm diameter range, with a  trace amount of pericholecystic fluid but no gallbladder wall  thickening.    A 2.2 by 1 point 6 cm fluid signal intensity lesion anteriorly in  the lateral segment left hepatic lobe is shown on image 15 of series  16, without associated enhancement, compatible with cyst.    Pancreas: Unremarkable    Spleen: Unremarkable    Adrenals/Urinary Tract: Unremarkable  where visualized    Stomach/Bowel: Unremarkable where visualized  Vascular/Lymphatic: Unremarkable    Musculoskeletal: Levoconvex lumbar scoliosis with significant rotary  component.     IMPRESSION:  1. Obstructive choledocholithiasis, with a 7 mm stone impacted in  the distal CBD near the ampulla. No pancreatic duct dilatation is  observed.  2. There are two 8 mm gallstones in the gallbladder. Trace  pericholecystic fluid but no gallbladder wall thickening.  3. Left hepatic lobe cyst.  4. Moderate-sized hiatal hernia.  5. Levoconvex lumbar  scoliosis  Electronically Signed    By: Sherryl Barters M.D.    On: 11/06/2014 12:18         Verified By: Carron Curie, M.D.,   Assessment/Plan:  Assessment/Plan:  Assessment CBD stone. Pancreatitis resolving.   Plan Discussed ERCP in detail with patient and family. Discussed potential risks, incl worsening of pancreatitis. Make sure patient is NPO after MN. Make sure to hold lovenox in AM. Will plan ERCP tomorrow AM. Hopefully, GB surgery soon afterwards if no complications from ERCP. thanks.   Electronic Signatures: Verdie Shire (MD)  (Signed 27-Dec-15 09:45)  Authored: Chief Complaint, VITAL SIGNS/ANCILLARY NOTES, Brief Assessment, Lab Results, Radiology Results, Assessment/Plan   Last Updated: 27-Dec-15 09:45 by Verdie Shire (MD)

## 2015-03-06 NOTE — Op Note (Signed)
PATIENT NAME:  [name] [name], [name] [name] MR#:  [id number] DATE OF BIRTH:  March 12, 1928  DATE OF PROCEDURE:  11/10/2014  PREOPERATIVE DIAGNOSIS: Gallstone pancreatitis, chronic cholecystitis, and cholelithiasis.   POSTOPERATIVE DIAGNOSIS: Gallstone pancreatitis, chronic cholecystitis, and cholelithiasis.   OPERATIVE PROCEDURE: Laparoscopic cholecystectomy with intraoperative cholangiogram.   OPERATING SURGEON: Hervey Ard, MD.    ANESTHESIA: General endotracheal under Dr. Kayleen Memos.   ESTIMATED BLOOD LOSS: Less than 5 mL.   CLINICAL NOTE: This 81 year old woman was seen in November in regards to episodic abdominal pain and findings suggestive of cholelithiasis. At that time she declined elective cholecystectomy. On Christmas she was admitted with biliary colic and evidence of common bile duct obstruction and pancreatitis. This has resolved. ERCP completed yesterday by Verdie Shire, MD showed no retained stones. Sphincterotomy was completed. She is brought to the operating room at this time for planned cholecystectomy to minimize the risk of future events.   OPERATIVE NOTE: The patient underwent general endotracheal anesthesia without difficulty. The patient received her scheduled dose of Zosyn during the induction of anesthesia. The abdomen was prepped with ChloraPrep and draped. In Trendelenburg position a Veress needle was placed through a transumbilical incision. After assuring intra-abdominal location with the hanging drop test the abdomen was insufflated with CO2 at 10 mmHg pressure. A 10 mm step port was expanded. Inspection showed no evidence of injury from initial port placement. The patient was placed in reverse Trendelenburg position and rolled to the left. An 11 mm XL port was placed in the epigastrium and two 5-mm step ports placed in the right side of the abdomen. The patient had an unusual band of tissue running across the abdomen transversely approximately 4 fingerbreadths below the costal margin.  This appeared to be as a ridge on both sides. It was soft to touch. The most inferior lateral port was placed below this and the more medial port placed above this.   The gallbladder was placed on cephalad traction. Adhesions of the omentum to the gallbladder were taken down with cautery dissection. The neck of the gallbladder was cleared. Fluoroscopic cholangiograms were completed using 40 mL of 1/2 strength Conray 60. A significant portion of the contrast refluxed into the gallbladder. A long thin cystic duct was visualized entering in the midportion of the common bile duct. There was rapid flow into the duodenum consistent with her previous sphincterotomy. Poor ductal dilatation and poor ductal distention. No evidence of biliary obstruction.   The gallbladder was decompressed with the [facility] needle. The cystic duct was doubly clipped and divided as was the cystic artery and its branch. This was done immediately adjacent to the gallbladder. The gallbladder was then removed from the liver bed making use of hook cautery dissection. A small rent in the gallbladder yielded clear bile and this was quickly aspirated away. The rent was controlled with multiple clip applications. The freed gallbladder was placed into an Endo Catch bag and then delivered through the 10 mm umbilical port site. Inspection from the epigastric site showed no evidence of injury from initial port placement. The right upper quadrant was irrigated. There was a small area of bleeding where the fundus of the gallbladder had been adherent to the liver bed. This was controlled initially with electrocautery and then a small piece of Surgicel. Final irrigation was completed and good hemostasis noted. The abdomen was then desufflated under direct vision and the ports were removed at this time. The fascia at the umbilicus was approximated with an 0 Vicryl figure-of-eight suture. Skin incisions  were closed with 4-0 Vicryl subcuticular sutures. Benzoin,  Steri-Strips, Telfa, and Tegaderm dressings were applied.   The patient was initially extubated in the recovery room and then showed poor tidal volumes and was reintubated prior to transfer to the recovery area.    ____________________________ Robert Bellow, MD jwb:bu D: 11/10/2014 15:46:26 ET T: 11/10/2014 16:15:34 ET JOB#: [id number]  cc: Robert Bellow, MD, <Dictator> Einar Pheasant, MD Lupita Dawn. Candace Cruise, MD Stanisha Lorenz Amedeo Kinsman MD ELECTRONICALLY SIGNED 11/12/2014 8:48

## 2015-03-06 NOTE — H&P (Signed)
PATIENT NAME:  [name] [name], [name] [name] MR#:  [id number] DATE OF BIRTH:  August 07, 1928  DATE OF ADMISSION:  11/06/2014  PRIMARY CARE PHYSICIAN:  [name] Pheasant, MD  REFERRING PHYSICIAN:  Valli Glance. Owens Shark, MD    CHIEF COMPLAINT:  Abdominal pain in the right upper quadrant, nausea, vomiting.   HISTORY OF PRESENT ILLNESS:  [name] [name] is an 81 year old female with a history of atrial fibrillation on chronic anticoagulation, hypertension, hyperlipidemia, continued tobacco use, who came to the Emergency Department with complaints of right upper quadrant pain, nausea, vomiting that started since 5:00 in the evening. The patient states she had similar episodes twice in the past and follows up with [name] [name] with known history of cholelithiasis. However, this particular episode did not resolve. Concerning this, she came to the Emergency Department. Workup in the Emergency Department with ultrasound of the abdomen shows that she has a possible CBD stone with dilatation and also a stone at the gallbladder neck measuring about 1.3 cm. The patient is also found to have elevated lipase of 560. Concerning this, the patient will be admitted under the medicine service. The patient states she follows up with [name] [name] for atrial fibrillation. The patient was told that the patient has a murmur, which is followed closely. However, the patient does not know whether it is from the mitral or aortic valve. The patient has a history of smoking.   PAST MEDICAL HISTORY: 1.  Atrial fibrillation on chronic anticoagulation with Coumadin.  2.  Hypertension.  3.  Heart murmur.  4.  Carotid artery stenosis followed closely with ultrasound of the carotids every 2 years.   PAST SURGICAL HISTORY:  None.   ALLERGIES:  No known drug allergies.   HOME MEDICATIONS:  We do not have a list of the medications.   SOCIAL HISTORY:  Continues to smoke 1/2 pack a day. Drinks alcohol socially almost most of the days. Denies using any illicit  drugs. Lives by herself. Independent of ADLs and IADLs.   FAMILY HISTORY:  The patient has 9 siblings who died mostly from the heart-related issues. The patient states only 1 sibling is alive.   REVIEW OF SYSTEMS: CONSTITUTIONAL:  Experiencing generalized weakness.  EYES:  No change in vision.  EARS, NOSE, AND THROAT:  No change in hearing.  RESPIRATORY:  Has mild cough with no productive sputum.  CARDIOVASCULAR:  No chest pain or palpitations.  GASTROINTESTINAL:  Has nausea, vomiting, and abdominal pain.  GENITOURINARY:  No dysuria or hematochezia.  HEMATOLOGIC:  No easy bruising or bleeding.  SKIN:  No rash or lesions.  MUSCULOSKELETAL:  No joint pains and aches.  NEUROLOGIC:  No weakness or numbness in any part of the body.  ENDOCRINE:  No polyuria or polydipsia.   PHYSICAL EXAMINATION: GENERAL:  This is a well-built, well-nourished, age-appropriate female lying down in the bed, not in distress.  VITAL SIGNS:  Temperature 97.7, pulse 87, blood pressure 140/73, respiratory rate 20, oxygen saturation 96% on room air.  HEENT:  Head is normocephalic and atraumatic. Has scleral icterus. Conjunctivae are normal. Pupils are equal and react to light. Mucous membranes are dry. No pharyngeal erythema.  NECK:  Supple. No lymphadenopathy. No JVD. The patient has bilateral carotid bruit. No thyromegaly.  CHEST:  Has no focal tenderness.  LUNGS:  Bilaterally clear to auscultation.  HEART:  Irregularly irregular. Has systolic murmur radiating to the carotids.  ABDOMEN:  Bowel sounds present. Soft, nontender, nondistended. Cannot appreciate hepatosplenomegaly.  EXTREMITIES:  No pedal edema. Pulses are  2+.  SKIN:  No rash or lesions.  MUSCULOSKELETAL:  Good range of motion in all of the extremities.  NEUROLOGIC:  The patient is alert and oriented to place, person, and time. Cranial nerves II through XII are intact. Motor is 5/5 in upper and lower extremities.   LABORATORY DATA:  CMP:  BUN 16,  creatinine 1.18, bilirubin 6.3, alkaline phosphatase 365, ALT 167, and AST 225.   CBC:  WBC 21,000, hemoglobin 11, and platelet count 138,000.   Coagulation profile shows PT of 17 and INR of 1.4.   UA is negative for nitrites and leukocyte esterase.   Ultrasound of the right upper quadrant:  Gallstones and sludge without sonographic evidence of acute cholecystitis, concern about CBD dilatation.   ASSESSMENT AND PLAN:  Common bile duct stone with obstructive jaundice. We did not have direct visualization of the stone in the common bile duct; however, there was concern about dilatation of the common bile duct. Consult GI. Keep the patient n.p.o. The patient has elevated white blood cell count. We will treat the patient as sepsis from cholangitis or cholecystitis.   1.  Sepsis secondary to cholangitis/cholecystitis. The patient has a cystic ductal stone as well as possible common bile duct stone. Keep the patient on Zosyn.  2.  Cholelithiasis with a stone obstructing the neck of the gallbladder. The patient is followed as an outpatient with [name] [name]. Consult surgery. The patient does not know the type of heart murmur. Especially concerning the patient's tobacco use and always concern about if the patient has underlying aortic stenosis, consult [name] [name], as the patient follows up as an outpatient, in order to obtain medical records as well as preoperative clearance.  3.  Tobacco use. We will keep the patient on a nicotine patch.  4.  History of alcohol use. The patient states she drinks only socially, however, frequently will have close followup for any alcohol withdrawal signs.  5.  Acute pancreatitis, mild. Continue with IV fluids and pain management.  6.  Atrial fibrillation. Rate is currently well controlled. We do not have a list of the medications. Hold the Coumadin.  7.  Hypertension, currently well controlled. We will continue the home medications.  8.  Keep the patient on deep vein  thrombosis prophylaxis with Lovenox, as the patient's INR is currently 1.4.   TIME SPENT:  60 minutes.   ____________________________ [name] Becton, MD pv:nb D: 11/06/2014 03:37:19 ET T: 11/06/2014 05:43:41 ET JOB#: [id number]  cc: [name] Becton, MD, <Dictator> [name] Pheasant, MD [name] Becton MD ELECTRONICALLY SIGNED 11/09/2014 22:12

## 2015-03-06 NOTE — Consult Note (Signed)
Pt feeling better, no chills, no abd pain.  Lipase up to 1100, TB at 7, WBC fell from 22,000 to 8,300 overnight.  Plt 95K, hgb 9.4. clear, heart RRR. abd slight tender in RUQ.   biliary pancreatitis, cholangitis Oh will be the GI doctor on call tomorrow and Monday and he will likely do the ERCP one of those days. I will call him today.  Pt prefers to stay at [facility] if possible.  Electronic Signatures: Manya Silvas (MD)  (Signed on 26-Dec-15 10:32)  Authored  Last Updated: 26-Dec-15 10:32 by Manya Silvas (MD)

## 2015-03-06 NOTE — Consult Note (Signed)
PATIENT NAME:  [name] [name], CORLISS MR#:  [id number] DATE OF BIRTH:  October 02, 1928  DATE OF CONSULTATION:  11/06/2014  REFERRING PHYSICIAN:   CONSULTING PHYSICIAN:  Manya Silvas, MD  The patient is an 81 year old white female who was admitted to the hospital for right upper quadrant pain and nausea and vomiting and she was seen in the ER and was found to have a probable common bile duct stone with dilatation of the common bile duct as well as a stone in the neck of the gallbladder measuring 1.3 cm and a lipase elevated at 560.  She also had a bilirubin of over 5. Because of the likelihood of a blockage from a common bile duct stone, she was admitted to the hospital and I was asked to see her in consultation.   PAST MEDICAL HISTORY:  1.  Carotid artery stenosis on the right.  2.  Heart murmur.  3.  Hypertension.  4.  Chronic atrial fibrillation on chronic anticoagulation with Coumadin.   PAST SURGICAL HISTORY: Appendectomy.   ALLERGIES: No known drug allergies.   HABITS: Smokes 1/2 pack a day, drinks alcohol socially, lives by herself.   FAMILY HISTORY: Nine siblings, 1 is alive. No cancer in the immediate family.   REVIEW OF SYSTEMS:  Generalized weakness has been present, no changes in vision or hearing.  She has had a mild viral-like respiratory illness with some productive cough.  No yellow or green phlegm, no chest pains. No abnormal heartbeats over her baseline.  GENITOURINARY: No dysuria or hematuria.  GASTROINTESTINAL:  See history of present illness   PHYSICAL EXAMINATION:  GENERAL: Pleasant elderly white female in no acute distress.  HEENT: Sclerae are icteric. Conjunctivae negative.  HEAD: Atraumatic.  NECK:  She has a carotid bruit on the right.   CHEST: Clear in anterolateral fields.  HEART: A 1/6 systolic murmur.  ABDOMEN: Bowel sounds are present. There is no obvious hepatosplenomegaly.  EXTREMITIES: No edema.    NEUROLOGIC: She is alert and oriented, appropriate.  Her daughter is present and confirms the history.    LABORATORY DATA:  Total bilirubin 6.3, alkaline phosphatase 365. ALT 167, AST 225, creatinine 1.18, BUN 16, white blood count 21,000, hemoglobin 11, platelet count 138,000. PT shows an INR 1.4 with a ProTime of 17.  Ultrasound of the right upper quadrant shows gallstones and sludge without evidence of acute cholecystitis.   ASSESSMENT: Abdominal pain, elevated bilirubin, gallstones, elevated white blood count and a mildly elevated lipase.  Clearly a common bile duct stone is the most likely cause for this.   RECOMMENDATIONS:  1.  Agree with urgent MRCP.  2.  Agree with empiric antibiotics.  3.  Would hold anticoagulants at this time because she may be getting an ERCP with sphincterotomy and gallstone removal depending on MRCP results.  4.  Agree with the use of Zosyn with good spectrum of coverage.   There are no gastroenterologists on call today who have ERCP experience.  I will contact Dr. Allen Norris and see if he is available.  It may be necessary to transfer her to another facility and possibly have it done there with the possibility of returning to [facility] afterwards.      ____________________________ Manya Silvas, MD rte:DT D: 11/06/2014 09:38:42 ET T: 11/06/2014 09:56:45 ET JOB#: [id number]  cc: Manya Silvas, MD, <Dictator> Robert Bellow, MD  Manya Silvas MD ELECTRONICALLY SIGNED 11/11/2014 14:00

## 2015-03-08 LAB — SURGICAL PATHOLOGY

## 2015-03-10 NOTE — Discharge Summary (Signed)
PATIENT NAME:  [name] [name], [name] [name] MR#:  [id number] DATE OF BIRTH:  11-06-1928  DATE OF ADMISSION:  11/06/2014 DATE OF DISCHARGE:  11/11/2014  ADMITTING PHYSICIAN: [name] Becton, MD.  DISCHARGING PHYSICIAN: [name] Lighter, MD.  PRIMARY CARE PHYSICIAN: [name] Pheasant, MD.  [place]:  GI consultation by Dr. Lupita [name]. [name] Cruise, MD. Surgical consultation by Dr. Robert Bellow, MD..   DISCHARGE DIAGNOSES:  1. Acute obstructive biliary pancreatitis status post endoscopic retrograde cholangiopancreatography.  2. Choledocholithiasis.  3. Cholelithiasis status post cholecystectomy.  4. Chronic obstructive pulmonary disease.  5. Hypertension.  6. Hypokalemia.  7. Tobacco use disorder.  8. Transient respiratory failure after surgery which has been resolved.  DISCHARGE HOME MEDICATIONS:  1. Aspirin 81 mg p.o. daily.  2. Coreg 25 mg 1/2 tablet p.o. b.i.d.  3. Klonopin 0.25 mg p.o. daily as needed.  4. Cozaar 100 mg daily.  5. Omeprazole 20 mg p.o. daily.  6. Warfarin 5 mg. Advised to start from November 12, 2014.  7. Pravachol 40 mg p.o. daily. Advised to start after 1 week. 8. Hydralazine 50 mg p.o. 3 times a day.  9. Norvasc 5 mg p.o. daily.  10. Advair 100/50 mcg one puff b.i.d.  11. Combivent Respimed 1 puff 4 times a day.   DISCHARGE DIET: Low-sodium diet.   DISCHARGE ACTIVITY: As tolerated.   FOLLOWUP INSTRUCTIONS: 1. Follow up with Dr. Bary [name] in 1 week.  2. PCP followup in 1 to 2 weeks.  LABORATORIES AND IMAGING STUDIES PRIOR TO DISCHARGE: Sodium 136, potassium 4.3, chloride 104, bicarbonate 22, BUN 17, creatinine 0.95, glucose 99, and calcium of 8.6. ALT 62, AST 54, alkaline phosphatase 169, total bilirubin is 1.5, albumin of 2.5. Lipase is 171,000. ABGs 7.41, pCO2 of 34, pO2 of 115, bicarbonate 21 and saturations of 98% on 40% FiO2 while on ventilator.  WBC 5.4, hemoglobin 9.2, hematocrit 27.3, platelet count 125. INR is 1.1.  Intraoperative  cholangiogram showing unremarkable changes, normal opacified common bile duct with no filling defects noted. ERCP done on 11/09/2014, showing entire bile duct was moderately dilated. Sphincterotomy was performed. Biliary tree was swept, and no stones were found at this time.  BRIEF HOSPITAL COURSE: Ms. [name] [name] is an 81 year old Caucasian female with past medical history significant for smoking, hypertension, history of atrial fibrillation on Coumadin and carotid artery stenosis, who presents to the hospital secondary to right upper quadrant abdominal pain, nausea, vomiting and jaundice and noted to have acute biliary pancreatitis.  1. Acute obstructive biliary pancreatitis with choledocholithiasis. Significantly elevated liver tests and also lipase were noted, which kept worsening and MRCP done in the hospital showed that the patient had a 7-mm stone impacted in the distal CBD. While waiting for GI to do an ERCP, the patient seemed like she might passed a stone and her numbers started improving as well as her symptoms. GI did an ERCP on her on 11/09/2014, but the CBD was dilated but it was clean, completely swept over and sphincterotomy was done. The patient has resolved her symptoms and because of her significant cholelithiasis surgery was consulted and she had a laparoscopic cholecystectomy done on 11/10/2014 by Dr. Bary [name]. The patient is doing well, tolerating diet well and is being discharged home with outpatient followup.  2. Transient respiratory failure postoperatively. Difficult to wean off the vent. Re-intubated. However, extubated after 4 hours and is tolerating well and on room air at this time. Likely has underlying COPD. Started on Advair and Combivent here in the hospital with significant  improvement in her respiratory symptoms.  3. Hypertension. Medications had to be increased in the hospital. She is on Coreg, Cozaar. Hydralazine dose was increased and amlodipine was added.  4. History of  atrial fibrillation, rate controlled on Coreg, on Coumadin, which was advised to be restarted one day after discharge. 5. Her course has been otherwise uneventful in the hospital.   DISCHARGE CONDITION: Stable.   DISCHARGE DISPOSITION: Home.  TIME SPENT ON DISCHARGE: 40 minutes.   ____________________________ [name] Lighter, MD rk:jh D: 11/11/2014 15:11:36 ET T: 11/11/2014 16:04:19 ET JOB#: [id number]  cc: [name] Lighter, MD, <Dictator> [name] Bellow, MD [name] Pheasant, MD [name] Silvas, MD [name] Lighter MD ELECTRONICALLY SIGNED 11/17/2014 16:45

## 2015-03-14 NOTE — Op Note (Signed)
PATIENT NAME:  [name] [name], [name] [name] MR#:  [id number] DATE OF BIRTH:  04-01-1928  DATE OF PROCEDURE:  11/10/2014  PREOPERATIVE DIAGNOSIS: Gallstone pancreatitis, chronic cholecystitis, and cholelithiasis.   POSTOPERATIVE DIAGNOSIS: Gallstone pancreatitis, chronic cholecystitis, and cholelithiasis.   OPERATIVE PROCEDURE: Laparoscopic cholecystectomy with intraoperative cholangiogram.   OPERATING SURGEON: Hervey Ard, MD.    ANESTHESIA: General endotracheal under Dr. Kayleen Memos.   ESTIMATED BLOOD LOSS: Less than 5 mL.   CLINICAL NOTE: This 81 year old woman was seen in November in regards to episodic abdominal pain and findings suggestive of cholelithiasis. At that time she declined elective cholecystectomy. On Christmas she was admitted with biliary colic and evidence of common bile duct obstruction and pancreatitis. This has resolved. ERCP completed yesterday by Verdie Shire, MD showed no retained stones. Sphincterotomy was completed. She is brought to the operating room at this time for planned cholecystectomy to minimize the risk of future events.   OPERATIVE NOTE: The patient underwent general endotracheal anesthesia without difficulty. The patient received her scheduled dose of Zosyn during the induction of anesthesia. The abdomen was prepped with ChloraPrep and draped. In Trendelenburg position a Veress needle was placed through a transumbilical incision. After assuring intra-abdominal location with the hanging drop test the abdomen was insufflated with CO2 at 10 mmHg pressure. A 10 mm step port was expanded. Inspection showed no evidence of injury from initial port placement. The patient was placed in reverse Trendelenburg position and rolled to the left. An 11 mm XL port was placed in the epigastrium and two 5-mm step ports placed in the right side of the abdomen. The patient had an unusual band of tissue running across the abdomen transversely approximately 4 fingerbreadths below the costal margin.  This appeared to be as a ridge on both sides. It was soft to touch. The most inferior lateral port was placed below this and the more medial port placed above this.   The gallbladder was placed on cephalad traction. Adhesions of the omentum to the gallbladder were taken down with cautery dissection. The neck of the gallbladder was cleared. Fluoroscopic cholangiograms were completed using 40 mL of 1/2 strength Conray 60. A significant portion of the contrast refluxed into the gallbladder. A long thin cystic duct was visualized entering in the midportion of the common bile duct. There was rapid flow into the duodenum consistent with her previous sphincterotomy. Poor ductal dilatation and poor ductal distention. No evidence of biliary obstruction.   The gallbladder was decompressed with the [facility] needle. The cystic duct was doubly clipped and divided as was the cystic artery and its branch. This was done immediately adjacent to the gallbladder. The gallbladder was then removed from the liver bed making use of hook cautery dissection. A small rent in the gallbladder yielded clear bile and this was quickly aspirated away. The rent was controlled with multiple clip applications. The freed gallbladder was placed into an Endo Catch bag and then delivered through the 10 mm umbilical port site. Inspection from the epigastric site showed no evidence of injury from initial port placement. The right upper quadrant was irrigated. There was a small area of bleeding where the fundus of the gallbladder had been adherent to the liver bed. This was controlled initially with electrocautery and then a small piece of Surgicel. Final irrigation was completed and good hemostasis noted. The abdomen was then desufflated under direct vision and the ports were removed at this time. The fascia at the umbilicus was approximated with an 0 Vicryl figure-of-eight suture. Skin incisions  were closed with 4-0 Vicryl subcuticular sutures. Benzoin,  Steri-Strips, Telfa, and Tegaderm dressings were applied.   The patient was initially extubated in the recovery room and then showed poor tidal volumes and was reintubated prior to transfer to the recovery area.    ____________________________ Robert Bellow, MD jwb:bu D: 11/10/2014 15:46:26 ET T: 11/10/2014 16:15:34 ET JOB#: [id number]  cc: Robert Bellow, MD, <Dictator> Einar Pheasant, MD Lupita Dawn. Candace Cruise, MD Shanequia Kendrick Amedeo Kinsman MD ELECTRONICALLY SIGNED 11/12/2014 8:48

## 2015-03-31 ENCOUNTER — Ambulatory Visit (INDEPENDENT_AMBULATORY_CARE_PROVIDER_SITE_OTHER): Payer: Medicare Other

## 2015-03-31 DIAGNOSIS — Z7901 Long term (current) use of anticoagulants: Secondary | ICD-10-CM | POA: Diagnosis not present

## 2015-03-31 DIAGNOSIS — I4891 Unspecified atrial fibrillation: Secondary | ICD-10-CM | POA: Diagnosis not present

## 2015-03-31 DIAGNOSIS — Z5181 Encounter for therapeutic drug level monitoring: Secondary | ICD-10-CM | POA: Diagnosis not present

## 2015-03-31 LAB — POCT INR: INR: 2.1

## 2015-04-06 ENCOUNTER — Other Ambulatory Visit: Payer: Self-pay | Admitting: Internal Medicine

## 2015-05-11 ENCOUNTER — Other Ambulatory Visit: Payer: Self-pay | Admitting: Internal Medicine

## 2015-05-11 NOTE — Telephone Encounter (Signed)
Last visit 09/08/14. Has appt scheduled 07/23/15. Last refill 09/08/14 #30

## 2015-05-11 NOTE — Telephone Encounter (Signed)
ok'd refill clonazepam #30 with no refills.

## 2015-05-12 ENCOUNTER — Ambulatory Visit (INDEPENDENT_AMBULATORY_CARE_PROVIDER_SITE_OTHER): Payer: Medicare Other

## 2015-05-12 DIAGNOSIS — Z7901 Long term (current) use of anticoagulants: Secondary | ICD-10-CM | POA: Diagnosis not present

## 2015-05-12 DIAGNOSIS — I4891 Unspecified atrial fibrillation: Secondary | ICD-10-CM | POA: Diagnosis not present

## 2015-05-12 DIAGNOSIS — Z5181 Encounter for therapeutic drug level monitoring: Secondary | ICD-10-CM | POA: Diagnosis not present

## 2015-05-12 LAB — POCT INR: INR: 2.4

## 2015-05-12 NOTE — Telephone Encounter (Signed)
Rx phoned into pharmacy.

## 2015-06-23 ENCOUNTER — Ambulatory Visit (INDEPENDENT_AMBULATORY_CARE_PROVIDER_SITE_OTHER): Payer: Medicare Other | Admitting: *Deleted

## 2015-06-23 DIAGNOSIS — Z5181 Encounter for therapeutic drug level monitoring: Secondary | ICD-10-CM

## 2015-06-23 DIAGNOSIS — Z7901 Long term (current) use of anticoagulants: Secondary | ICD-10-CM

## 2015-06-23 DIAGNOSIS — I4891 Unspecified atrial fibrillation: Secondary | ICD-10-CM

## 2015-06-23 LAB — POCT INR: INR: 2.2

## 2015-06-28 ENCOUNTER — Other Ambulatory Visit: Payer: Self-pay | Admitting: Internal Medicine

## 2015-07-06 ENCOUNTER — Other Ambulatory Visit: Payer: Self-pay | Admitting: Internal Medicine

## 2015-07-09 DIAGNOSIS — H40013 Open angle with borderline findings, low risk, bilateral: Secondary | ICD-10-CM | POA: Diagnosis not present

## 2015-07-23 ENCOUNTER — Ambulatory Visit: Payer: Medicare Other | Admitting: Internal Medicine

## 2015-07-27 ENCOUNTER — Encounter: Payer: Self-pay | Admitting: Internal Medicine

## 2015-07-27 ENCOUNTER — Ambulatory Visit (INDEPENDENT_AMBULATORY_CARE_PROVIDER_SITE_OTHER): Payer: Medicare Other | Admitting: Internal Medicine

## 2015-07-27 VITALS — BP 128/82 | HR 79 | Temp 97.9°F | Ht 65.5 in | Wt 155.5 lb

## 2015-07-27 DIAGNOSIS — R7989 Other specified abnormal findings of blood chemistry: Secondary | ICD-10-CM

## 2015-07-27 DIAGNOSIS — R945 Abnormal results of liver function studies: Secondary | ICD-10-CM

## 2015-07-27 DIAGNOSIS — IMO0001 Reserved for inherently not codable concepts without codable children: Secondary | ICD-10-CM

## 2015-07-27 DIAGNOSIS — I4891 Unspecified atrial fibrillation: Secondary | ICD-10-CM | POA: Diagnosis not present

## 2015-07-27 DIAGNOSIS — I6523 Occlusion and stenosis of bilateral carotid arteries: Secondary | ICD-10-CM | POA: Diagnosis not present

## 2015-07-27 DIAGNOSIS — F17200 Nicotine dependence, unspecified, uncomplicated: Secondary | ICD-10-CM

## 2015-07-27 DIAGNOSIS — Z720 Tobacco use: Secondary | ICD-10-CM

## 2015-07-27 DIAGNOSIS — K7689 Other specified diseases of liver: Secondary | ICD-10-CM

## 2015-07-27 DIAGNOSIS — I10 Essential (primary) hypertension: Secondary | ICD-10-CM | POA: Diagnosis not present

## 2015-07-27 DIAGNOSIS — Z23 Encounter for immunization: Secondary | ICD-10-CM | POA: Diagnosis not present

## 2015-07-27 DIAGNOSIS — D649 Anemia, unspecified: Secondary | ICD-10-CM

## 2015-07-27 DIAGNOSIS — D696 Thrombocytopenia, unspecified: Secondary | ICD-10-CM

## 2015-07-27 DIAGNOSIS — E785 Hyperlipidemia, unspecified: Secondary | ICD-10-CM

## 2015-07-27 MED ORDER — CLONAZEPAM 0.5 MG PO TABS: ORAL_TABLET | ORAL | Status: DC

## 2015-07-27 NOTE — Progress Notes (Signed)
Patient ID: [name] [name], female   DOB: 06-19-1928, 81 y.o.   MRN: [id number]   Subjective:    Patient ID: [name] [name], female    DOB: 09/18/1928, 81 y.o.   MRN: [id number]  HPI  Patient here for a scheduled follow up.  Recently had lap cholecystectomy 912/29/15).  Has done well since her surgery.  No increased diarrhea.  No abdominal pain or cramping.  Eating and drinking well.  No nausea or vomiting. Saw Dr Rockey Situ in 01/2015.  Blood pressure was a little elevated.  See his note.  States at home, blood pressure has been doing well.  Off chlorthalidone.  No increased fluid accumulation.  No chest pain or tightness.  Discussed the need for smoking cessation.  She desires not to quit at this time.      Past Medical History  Diagnosis Date  . Osteoarthritis   . Hypertension   . GERD (gastroesophageal reflux disease)   . Hyperlipidemia   . Cholelithiasis   . Chronic kidney disease   . Impaired fasting glucose   . Atrial fibrillation   . Carotid stenosis   . Chronic low back pain   . Gallstone pancreatitis 11-10-14  . Gallstones    Past Surgical History  Procedure Laterality Date  . Cataract extraction      x2  . Appendectomy    . Cholecystectomy  11-10-14  . Cholecystectomy     Family History  Problem Relation Age of Onset  . Atrial fibrillation Sister   . Atrial fibrillation Brother   . Heart attack Father 28  . Heart failure Sister 47  . Stroke Sister    Social History   Social History  . Marital Status: Widowed    Spouse Name: N/A  . Number of Children: N/A  . Years of Education: N/A   Social History Main Topics  . Smoking status: Former Smoker -- 0.50 packs/day    Types: Cigarettes    Quit date: 11/04/2014  . Smokeless tobacco: Never Used  . Alcohol Use: 0.0 oz/week    0 Standard drinks or equivalent per week     Comment: occasional  . Drug Use: No  . Sexual Activity: Not Asked   Other Topics Concern  . None   Social History Narrative     Outpatient Encounter Prescriptions as of 07/27/2015  Medication Sig  . aspirin 81 MG EC tablet Take 81 mg by mouth daily.    . carvedilol (COREG) 25 MG tablet Take 1 tablet (25 mg total) by mouth 2 (two) times daily with a meal.  . clonazePAM (KLONOPIN) 0.5 MG tablet TAKE 1/2 TO 1 TABLET BY MOUTH EVERY DAY AS NEEDED  . hydrALAZINE (APRESOLINE) 10 MG tablet TAKE 1 TABLET BY MOUTH 3 TIMES A DAY  . losartan (COZAAR) 100 MG tablet TAKE 1 TABLET BY MOUTH DAILY.  Marland Kitchen omeprazole (PRILOSEC) 20 MG capsule Take 20 mg by mouth daily.  . pravastatin (PRAVACHOL) 40 MG tablet TAKE 1 TABLET (40 MG TOTAL) BY MOUTH DAILY.  Marland Kitchen warfarin (COUMADIN) 5 MG tablet TAKE AS DIRECTED BY ANTICOAGULATION CLINIC  . [DISCONTINUED] chlorthalidone (HYGROTON) 25 MG tablet Take 25 mg by mouth daily.   . [DISCONTINUED] clonazePAM (KLONOPIN) 0.5 MG tablet TAKE 1/2 TO 1 TABLET BY MOUTH EVERY DAY AS NEEDED  . [DISCONTINUED] clonazePAM (KLONOPIN) 0.5 MG tablet TAKE 1/2 TO 1 TABLET BY MOUTH EVERY DAY AS NEEDED   No facility-administered encounter medications on file as of 07/27/2015.    Review of  Systems  Constitutional: Negative for appetite change and unexpected weight change.  HENT: Negative for congestion and sinus pressure.   Eyes: Negative for pain and visual disturbance.  Respiratory: Negative for cough, chest tightness and shortness of breath.   Cardiovascular: Negative for chest pain, palpitations and leg swelling (no increased swelling noted. ).  Gastrointestinal: Negative for nausea, vomiting, abdominal pain and diarrhea.  Genitourinary: Negative for dysuria and difficulty urinating.  Musculoskeletal: Negative for myalgias and joint swelling.  Skin: Negative for color change and rash.  Neurological: Negative for dizziness, light-headedness and headaches.  Psychiatric/Behavioral: Negative for dysphoric mood and agitation.       Objective:     Blood pressure rechecked by me:  144/78  Physical Exam   Constitutional: She appears well-developed and well-nourished. No distress.  HENT:  Nose: Nose normal.  Mouth/Throat: Oropharynx is clear and moist.  Eyes: Conjunctivae are normal. Right eye exhibits no discharge. Left eye exhibits no discharge.  Neck: Neck supple. No thyromegaly present.  Cardiovascular: Normal rate and regular rhythm.   Pulmonary/Chest: Breath sounds normal. No respiratory distress. She has no wheezes.  Abdominal: Soft. Bowel sounds are normal. There is no tenderness.  Musculoskeletal: She exhibits no tenderness.  No increased edema.    Lymphadenopathy:    She has no cervical adenopathy.  Skin: No rash noted. No erythema.  Psychiatric: She has a normal mood and affect. Her behavior is normal.    BP 128/82 mmHg  Pulse 79  Temp(Src) 97.9 F (36.6 C) (Oral)  Ht 5' 5.5" (1.664 m)  Wt 155 lb 8 oz (70.534 kg)  BMI 25.47 kg/m2  SpO2 96% Wt Readings from Last 3 Encounters:  07/27/15 155 lb 8 oz (70.534 kg)  01/20/15 147 lb 4 oz (66.792 kg)  11/24/14 151 lb (68.493 kg)     Lab Results  Component Value Date   WBC 5.4 11/09/2014   HGB 9.2* 11/09/2014   HCT 27.3* 11/09/2014   PLT 125* 11/09/2014   GLUCOSE 99 11/11/2014   CHOL 138 09/07/2014   TRIG 90.0 09/07/2014   HDL 40.80 09/07/2014   LDLCALC 79 09/07/2014   ALT 62 11/11/2014   AST 54* 11/11/2014   NA 136 11/11/2014   K 4.3 11/11/2014   CL 104 11/11/2014   CREATININE 0.95 11/11/2014   BUN 17 11/11/2014   CO2 22 11/11/2014   TSH 1.70 02/12/2014   INR 2.2 06/23/2015   HGBA1C 5.7 09/30/2013       Assessment & Plan:   Problem List Items Addressed This Visit    Abnormal liver function tests    S/p lap cholecystectomy.  Recheck liver panel.        Anemia    Follow cbc.       Relevant Orders   CBC with Differential/Platelet   Atrial fibrillation    Heart rate controlled.  Seeing Dr Rockey Situ.  Stable.        Relevant Orders   TSH   Carotid stenosis    Sees Dr Rockey Situ.  See his last note  for details.  Desired not to have carotid ultrasound at last visit.  Plan on repeat carotid ultrasound in 2017.        ESSENTIAL HYPERTENSION, BENIGN    Blood pressure per her report - does well at home.  Blood pressure as outlined here.  Pt feels good.  Continue same medication regimen.  Follow pressures.  Follow metabolic panel.        Relevant Orders   Basic  metabolic panel   Hyperlipidemia    Low cholesterol diet and exercise.  Follow lipid panel and liver function tests.  On pravastatin.        Relevant Orders   Lipid panel   Hepatic function panel   Liver cyst    Found on abdominal ultrasound.  She declines further w/up.        Smoking    Discussed the need to stop smoking.  She desires not to quit at this time.  Follow.        Thrombocytopenia    Recheck cbc.         Other Visit Diagnoses    Encounter for immunization    -  Primary        Einar Pheasant, MD

## 2015-07-27 NOTE — Progress Notes (Signed)
Pre-visit discussion using our clinic review tool. No additional management support is needed unless otherwise documented below in the visit note.  

## 2015-08-01 ENCOUNTER — Encounter: Payer: Self-pay | Admitting: Internal Medicine

## 2015-08-01 NOTE — Assessment & Plan Note (Signed)
Low cholesterol diet and exercise.  Follow lipid panel and liver function tests.  On pravastatin.   

## 2015-08-01 NOTE — Assessment & Plan Note (Signed)
Recheck cbc.  

## 2015-08-01 NOTE — Assessment & Plan Note (Signed)
Sees Dr Rockey Situ.  See his last note for details.  Desired not to have carotid ultrasound at last visit.  Plan on repeat carotid ultrasound in 2017.

## 2015-08-01 NOTE — Assessment & Plan Note (Signed)
Heart rate controlled.  Seeing Dr Rockey Situ.  Stable.

## 2015-08-01 NOTE — Assessment & Plan Note (Signed)
S/p lap cholecystectomy.  Recheck liver panel.

## 2015-08-01 NOTE — Assessment & Plan Note (Signed)
Found on abdominal ultrasound.  She declines further w/up.

## 2015-08-01 NOTE — Assessment & Plan Note (Signed)
Discussed the need to stop smoking.  She desires not to quit at this time.  Follow.

## 2015-08-01 NOTE — Assessment & Plan Note (Signed)
Blood pressure per her report - does well at home.  Blood pressure as outlined here.  Pt feels good.  Continue same medication regimen.  Follow pressures.  Follow metabolic panel.

## 2015-08-01 NOTE — Assessment & Plan Note (Signed)
Follow cbc.  

## 2015-08-04 ENCOUNTER — Encounter: Payer: Self-pay | Admitting: Cardiovascular Disease

## 2015-08-04 ENCOUNTER — Ambulatory Visit (INDEPENDENT_AMBULATORY_CARE_PROVIDER_SITE_OTHER): Payer: Medicare Other | Admitting: Cardiovascular Disease

## 2015-08-04 ENCOUNTER — Ambulatory Visit (INDEPENDENT_AMBULATORY_CARE_PROVIDER_SITE_OTHER): Payer: Medicare Other

## 2015-08-04 VITALS — BP 160/80 | HR 75 | Ht 65.5 in | Wt 155.5 lb

## 2015-08-04 DIAGNOSIS — I4891 Unspecified atrial fibrillation: Secondary | ICD-10-CM

## 2015-08-04 DIAGNOSIS — Z7901 Long term (current) use of anticoagulants: Secondary | ICD-10-CM | POA: Diagnosis not present

## 2015-08-04 DIAGNOSIS — Z720 Tobacco use: Secondary | ICD-10-CM

## 2015-08-04 DIAGNOSIS — I6523 Occlusion and stenosis of bilateral carotid arteries: Secondary | ICD-10-CM | POA: Diagnosis not present

## 2015-08-04 DIAGNOSIS — Z5181 Encounter for therapeutic drug level monitoring: Secondary | ICD-10-CM

## 2015-08-04 DIAGNOSIS — I350 Nonrheumatic aortic (valve) stenosis: Secondary | ICD-10-CM | POA: Diagnosis not present

## 2015-08-04 DIAGNOSIS — E785 Hyperlipidemia, unspecified: Secondary | ICD-10-CM

## 2015-08-04 DIAGNOSIS — F17200 Nicotine dependence, unspecified, uncomplicated: Secondary | ICD-10-CM

## 2015-08-04 LAB — POCT INR: INR: 1.9

## 2015-08-04 NOTE — Assessment & Plan Note (Signed)
We have encouraged her to continue to work on weaning her cigarettes and smoking cessation. She will continue to work on this and does not want any assistance with chantix.  

## 2015-08-04 NOTE — Progress Notes (Signed)
Patient ID: [name] [name], female    DOB: 28-Dec-1927, 81 y.o.   MRN: [id number]  HPI Comments: 81 yo with history of chronic atrial fibrillation, HTN, 60-79% bilateral carotid stenosis, continued smoking presents for followup of her carotid disease and atrial fibrillation.    In follow-up, she reports that she is doing well She continues to smoke at least 5 cigarettes per day We discussed her 60-79% bilateral carotid arterial disease, currently not interested in repeat carotid ultrasound. Blood pressure elevated on today's visit, reports systolic pressure 413 up to 244 at home Systolic pressure 010U on last visit with primary care  Status post laparoscopic cholecystectomy, no complications  EKG on today's visit shows atrial fibrillation with ventricular rate 75 bpm, no significant ST or T-wave changes  Other past medical history Carotid ultrasound showing 60-79 percent bilateral disease in April 2015 Cholesterol is less than 150   Previous blood work last year showed elevated creatinine. This has improved on lower chlorthalidone  Echo in 7/11 showed preserved EF with mild mitral regurgitaiton and mild pulmonary hypertension (PA systolic pressure 42 mmHg).       No Known Allergies  Outpatient Encounter Prescriptions as of 08/04/2015  Medication Sig  . aspirin 81 MG EC tablet Take 81 mg by mouth daily.    . carvedilol (COREG) 25 MG tablet Take 1 tablet (25 mg total) by mouth 2 (two) times daily with a meal.  . clonazePAM (KLONOPIN) 0.5 MG tablet TAKE 1/2 TO 1 TABLET BY MOUTH EVERY DAY AS NEEDED  . hydrALAZINE (APRESOLINE) 10 MG tablet TAKE 1 TABLET BY MOUTH 3 TIMES A DAY  . losartan (COZAAR) 100 MG tablet TAKE 1 TABLET BY MOUTH DAILY.  Marland Kitchen omeprazole (PRILOSEC) 20 MG capsule Take 20 mg by mouth daily.  . pravastatin (PRAVACHOL) 40 MG tablet TAKE 1 TABLET (40 MG TOTAL) BY MOUTH DAILY.  Marland Kitchen warfarin (COUMADIN) 5 MG tablet TAKE AS DIRECTED BY ANTICOAGULATION CLINIC   No  facility-administered encounter medications on file as of 08/04/2015.    Past Medical History  Diagnosis Date  . Osteoarthritis   . Hypertension   . GERD (gastroesophageal reflux disease)   . Hyperlipidemia   . Cholelithiasis   . Chronic kidney disease   . Impaired fasting glucose   . Atrial fibrillation   . Carotid stenosis   . Chronic low back pain   . Gallstone pancreatitis 11-10-14  . Gallstones     Past Surgical History  Procedure Laterality Date  . Cataract extraction      x2  . Appendectomy    . Cholecystectomy  11-10-14  . Cholecystectomy      Social History  reports that she has been smoking Cigarettes.  She has been smoking about 0.50 packs per day. She has never used smokeless tobacco. She reports that she drinks alcohol. She reports that she does not use illicit drugs.  Family History family history includes Atrial fibrillation in her brother and sister; Heart attack (age of onset: 71) in her father; Heart failure (age of onset: 26) in her sister; Stroke in her sister.   Review of Systems  Constitutional: Negative.   Respiratory: Negative.   Cardiovascular: Negative.   Gastrointestinal: Negative.   Musculoskeletal: Negative.   Skin: Negative.   Neurological: Negative.   Hematological: Negative.   Psychiatric/Behavioral: Negative.   All other systems reviewed and are negative.  BP 160/80 mmHg  Pulse 75  Ht 5' 5.5" (1.664 m)  Wt 155 lb 8 oz (70.534 kg)  BMI 25.47 kg/m2  Physical Exam  Constitutional: She is oriented to person, place, and time. She appears well-developed and well-nourished.  HENT:  Head: Normocephalic.  Nose: Nose normal.  Mouth/Throat: Oropharynx is clear and moist.  Eyes: Conjunctivae are normal. Pupils are equal, round, and reactive to light.  Neck: Normal range of motion. Neck supple. No JVD present. Carotid bruit is present.  Cardiovascular: Normal rate, regular rhythm, S1 normal, S2 normal and intact distal pulses.  Exam  reveals no gallop and no friction rub.   Murmur heard.  Crescendo systolic murmur is present with a grade of 2/6  Trace edema around the ankles bilaterally  Pulmonary/Chest: Effort normal and breath sounds normal. No respiratory distress. She has no wheezes. She has no rales. She exhibits no tenderness.  Abdominal: Soft. Bowel sounds are normal. She exhibits no distension. There is no tenderness.  Musculoskeletal: Normal range of motion. She exhibits no edema or tenderness.  Lymphadenopathy:    She has no cervical adenopathy.  Neurological: She is alert and oriented to person, place, and time. Coordination normal.  Skin: Skin is warm and dry. No rash noted. No erythema.  Psychiatric: She has a normal mood and affect. Her behavior is normal. Judgment and thought content normal.    Assessment and Plan  Nursing note and vitals reviewed.

## 2015-08-04 NOTE — Assessment & Plan Note (Signed)
Chronic atrial fibrillation, rate well controlled.  Tolerating anticoagulation

## 2015-08-04 NOTE — Assessment & Plan Note (Signed)
Murmur on exam likely secondary to aortic valve stenosis, mild

## 2015-08-04 NOTE — Assessment & Plan Note (Signed)
Cholesterol is at goal on the current lipid regimen. No changes to the medications were made.  

## 2015-08-04 NOTE — Assessment & Plan Note (Signed)
Carotid stenosis discussed with her in detail She is declining carotid ultrasound at this time. Would recommend annual ultrasound. We'll discuss again on her follow-up visit Stressed smoking cessation

## 2015-08-04 NOTE — Patient Instructions (Addendum)
You are doing well. No medication changes were made.  Please keep an eye on your pressures  Please call us if you have new issues that need to be addressed before your next appt.  Your physician wants you to follow-up in: 12 months.  You will receive a reminder letter in the mail two months in advance. If you don't receive a letter, please call our office to schedule the follow-up appointment.

## 2015-08-11 ENCOUNTER — Other Ambulatory Visit (INDEPENDENT_AMBULATORY_CARE_PROVIDER_SITE_OTHER): Payer: Medicare Other

## 2015-08-11 DIAGNOSIS — I4891 Unspecified atrial fibrillation: Secondary | ICD-10-CM | POA: Diagnosis not present

## 2015-08-11 DIAGNOSIS — E785 Hyperlipidemia, unspecified: Secondary | ICD-10-CM

## 2015-08-11 DIAGNOSIS — I10 Essential (primary) hypertension: Secondary | ICD-10-CM

## 2015-08-11 DIAGNOSIS — D649 Anemia, unspecified: Secondary | ICD-10-CM

## 2015-08-11 LAB — LIPID PANEL
CHOL/HDL RATIO: 2
Cholesterol: 135 mg/dL (ref 0–200)
HDL: 54.7 mg/dL (ref >39.00)
LDL Cholesterol: 65 mg/dL (ref 0–99)
NONHDL: 80.41
TRIGLYCERIDES: 78 mg/dL (ref 0.0–149.0)
VLDL: 15.6 mg/dL (ref 0.0–40.0)

## 2015-08-11 LAB — TSH: TSH: 1.63 u[IU]/mL (ref 0.35–4.50)

## 2015-08-11 LAB — HEPATIC FUNCTION PANEL
ALK PHOS: 63 U/L (ref 39–117)
ALT: 15 U/L (ref 0–35)
AST: 14 U/L (ref 0–37)
Albumin: 4.2 g/dL (ref 3.5–5.2)
BILIRUBIN DIRECT: 0.3 mg/dL (ref 0.0–0.3)
TOTAL PROTEIN: 6.1 g/dL (ref 6.0–8.3)
Total Bilirubin: 1.6 mg/dL — ABNORMAL HIGH (ref 0.2–1.2)

## 2015-08-11 LAB — BASIC METABOLIC PANEL
BUN: 13 mg/dL (ref 6–23)
CHLORIDE: 103 meq/L (ref 96–112)
CO2: 30 meq/L (ref 19–32)
CREATININE: 0.86 mg/dL (ref 0.40–1.20)
Calcium: 10.2 mg/dL (ref 8.4–10.5)
GFR: 66.67 mL/min (ref >60.00)
Glucose, Bld: 103 mg/dL — ABNORMAL HIGH (ref 70–99)
Potassium: 4.7 mEq/L (ref 3.5–5.1)
Sodium: 139 mEq/L (ref 135–145)

## 2015-08-11 LAB — CBC WITH DIFFERENTIAL/PLATELET
BASOS ABS: 0 10*3/uL (ref 0.0–0.1)
Basophils Relative: 0.6 % (ref 0.0–3.0)
Eosinophils Absolute: 0.2 10*3/uL (ref 0.0–0.7)
Eosinophils Relative: 4.5 % (ref 0.0–5.0)
HCT: 36.4 % (ref 36.0–46.0)
Hemoglobin: 12.4 g/dL (ref 12.0–15.0)
LYMPHS ABS: 1.3 10*3/uL (ref 0.7–4.0)
Lymphocytes Relative: 31.3 % (ref 12.0–46.0)
MCHC: 34.1 g/dL (ref 30.0–36.0)
MCV: 96.8 fl (ref 78.0–100.0)
MONO ABS: 0.4 10*3/uL (ref 0.1–1.0)
MONOS PCT: 9.5 % (ref 3.0–12.0)
NEUTROS ABS: 2.3 10*3/uL (ref 1.4–7.7)
NEUTROS PCT: 54.1 % (ref 43.0–77.0)
PLATELETS: 132 10*3/uL — AB (ref 150.0–400.0)
RBC: 3.77 Mil/uL — ABNORMAL LOW (ref 3.87–5.11)
RDW: 13.4 % (ref 11.5–15.5)
WBC: 4.2 10*3/uL (ref 4.0–10.5)

## 2015-08-22 ENCOUNTER — Other Ambulatory Visit: Payer: Self-pay | Admitting: Cardiovascular Disease

## 2015-08-23 ENCOUNTER — Other Ambulatory Visit: Payer: Self-pay | Admitting: Internal Medicine

## 2015-08-23 NOTE — Telephone Encounter (Signed)
Please review for refill, Thank you. 

## 2015-09-15 ENCOUNTER — Ambulatory Visit (INDEPENDENT_AMBULATORY_CARE_PROVIDER_SITE_OTHER): Payer: Medicare Other

## 2015-09-15 DIAGNOSIS — Z7901 Long term (current) use of anticoagulants: Secondary | ICD-10-CM

## 2015-09-15 DIAGNOSIS — I4891 Unspecified atrial fibrillation: Secondary | ICD-10-CM | POA: Diagnosis not present

## 2015-09-15 DIAGNOSIS — Z5181 Encounter for therapeutic drug level monitoring: Secondary | ICD-10-CM | POA: Diagnosis not present

## 2015-09-15 LAB — POCT INR: INR: 2.7

## 2015-10-11 ENCOUNTER — Other Ambulatory Visit: Payer: Self-pay | Admitting: Internal Medicine

## 2015-10-27 ENCOUNTER — Ambulatory Visit (INDEPENDENT_AMBULATORY_CARE_PROVIDER_SITE_OTHER): Payer: Medicare Other

## 2015-10-27 DIAGNOSIS — I4891 Unspecified atrial fibrillation: Secondary | ICD-10-CM | POA: Diagnosis not present

## 2015-10-27 DIAGNOSIS — Z7901 Long term (current) use of anticoagulants: Secondary | ICD-10-CM | POA: Diagnosis not present

## 2015-10-27 DIAGNOSIS — Z5181 Encounter for therapeutic drug level monitoring: Secondary | ICD-10-CM

## 2015-10-27 LAB — POCT INR: INR: 2.5

## 2015-10-29 ENCOUNTER — Ambulatory Visit (INDEPENDENT_AMBULATORY_CARE_PROVIDER_SITE_OTHER): Payer: Medicare Other | Admitting: Internal Medicine

## 2015-10-29 ENCOUNTER — Encounter: Payer: Self-pay | Admitting: Internal Medicine

## 2015-10-29 VITALS — BP 170/90 | HR 80 | Temp 97.7°F | Resp 18 | Ht 65.5 in | Wt 160.0 lb

## 2015-10-29 DIAGNOSIS — I10 Essential (primary) hypertension: Secondary | ICD-10-CM | POA: Diagnosis not present

## 2015-10-29 DIAGNOSIS — I4891 Unspecified atrial fibrillation: Secondary | ICD-10-CM | POA: Diagnosis not present

## 2015-10-29 DIAGNOSIS — I350 Nonrheumatic aortic (valve) stenosis: Secondary | ICD-10-CM

## 2015-10-29 DIAGNOSIS — F17200 Nicotine dependence, unspecified, uncomplicated: Secondary | ICD-10-CM

## 2015-10-29 DIAGNOSIS — R945 Abnormal results of liver function studies: Secondary | ICD-10-CM

## 2015-10-29 DIAGNOSIS — I6523 Occlusion and stenosis of bilateral carotid arteries: Secondary | ICD-10-CM | POA: Diagnosis not present

## 2015-10-29 DIAGNOSIS — D696 Thrombocytopenia, unspecified: Secondary | ICD-10-CM

## 2015-10-29 DIAGNOSIS — Z720 Tobacco use: Secondary | ICD-10-CM

## 2015-10-29 DIAGNOSIS — R7989 Other specified abnormal findings of blood chemistry: Secondary | ICD-10-CM

## 2015-10-29 NOTE — Progress Notes (Signed)
Patient ID: [name] [name], female   DOB: 18-Apr-1928, 81 y.o.   MRN: [id number]   Subjective:    Patient ID: [name] [name], female    DOB: 1928-10-28, 81 y.o.   MRN: [id number]  HPI  Patient with past history of hypercholesterolemia, GERD, afib and hypertension.  She comes in today to follow up on these issues as well as for a complete physical exam.  She tries to stay active.  Reports no cardiac symptoms with increased activity or exertion.  No sob.  No acid reflux.  No abdominal pain or cramping.  Bowels stable.  Some change since gallbladder removed, but is better and stable.  Blood pressure elevated in the office.  Better on outside checs.  Followed by cardiology.  See Dr Donivan Scull note for details.  Heart doing well.     Past Medical History  Diagnosis Date  . Osteoarthritis   . Hypertension   . GERD (gastroesophageal reflux disease)   . Hyperlipidemia   . Cholelithiasis   . Chronic kidney disease   . Impaired fasting glucose   . Atrial fibrillation ([place])   . Carotid stenosis   . Chronic low back pain   . Gallstone pancreatitis 11-10-14  . Gallstones    Past Surgical History  Procedure Laterality Date  . Cataract extraction      x2  . Appendectomy    . Cholecystectomy  11-10-14  . Cholecystectomy     Family History  Problem Relation Age of Onset  . Atrial fibrillation Sister   . Atrial fibrillation Brother   . Heart attack Father 65  . Heart failure Sister 23  . Stroke Sister    Social History   Social History  . Marital Status: Widowed    Spouse Name: N/A  . Number of Children: N/A  . Years of Education: N/A   Social History Main Topics  . Smoking status: Current Some Day Smoker -- 0.50 packs/day    Types: Cigarettes    Last Attempt to Quit: 11/04/2014  . Smokeless tobacco: Never Used  . Alcohol Use: 0.0 oz/week    0 Standard drinks or equivalent per week     Comment: occasional  . Drug Use: No  . Sexual Activity: Not Asked   Other Topics  Concern  . None   Social History Narrative    Outpatient Encounter Prescriptions as of 10/29/2015  Medication Sig  . aspirin 81 MG EC tablet Take 81 mg by mouth daily.    . carvedilol (COREG) 25 MG tablet TAKE 1 TABLET (25 MG TOTAL) BY MOUTH 2 (TWO) TIMES DAILY WITH A MEAL.  . clonazePAM (KLONOPIN) 0.5 MG tablet TAKE 1/2 TO 1 TABLET BY MOUTH EVERY DAY AS NEEDED  . hydrALAZINE (APRESOLINE) 10 MG tablet TAKE 1 TABLET BY MOUTH 3 TIMES A DAY  . losartan (COZAAR) 100 MG tablet TAKE 1 TABLET BY MOUTH DAILY.  Marland Kitchen omeprazole (PRILOSEC) 20 MG capsule Take 20 mg by mouth daily.  . pravastatin (PRAVACHOL) 40 MG tablet TAKE 1 TABLET (40 MG TOTAL) BY MOUTH DAILY.  Marland Kitchen warfarin (COUMADIN) 5 MG tablet TAKE AS DIRECTED BY ANTICOAGULATION CLINIC   No facility-administered encounter medications on file as of 10/29/2015.    Review of Systems  Constitutional: Negative for appetite change and unexpected weight change.  HENT: Negative for congestion and sinus pressure.   Respiratory: Negative for cough, chest tightness and shortness of breath.   Cardiovascular: Negative for chest pain and palpitations.  Gastrointestinal: Negative for nausea, vomiting,  abdominal pain and diarrhea.  Genitourinary: Negative for dysuria and difficulty urinating.  Musculoskeletal: Negative for back pain and joint swelling.  Skin: Negative for color change and rash.  Neurological: Negative for dizziness, light-headedness and headaches.  Psychiatric/Behavioral: Negative for dysphoric mood and agitation.       Objective:     Blood pressure rechecked by me:  166/84  Physical Exam  Constitutional: She appears well-developed and well-nourished. No distress.  HENT:  Nose: Nose normal.  Mouth/Throat: Oropharynx is clear and moist.  Neck: Neck supple. No thyromegaly present.  Cardiovascular: Normal rate and regular rhythm.   Pulmonary/Chest: Breath sounds normal. No respiratory distress. She has no wheezes.  Abdominal: Soft.  Bowel sounds are normal. There is no tenderness.  Musculoskeletal: She exhibits no edema or tenderness.  Lymphadenopathy:    She has no cervical adenopathy.  Skin: No rash noted. No erythema.  Psychiatric: She has a normal mood and affect. Her behavior is normal.    BP 170/90 mmHg  Pulse 80  Temp(Src) 97.7 F (36.5 C) (Oral)  Resp 18  Ht 5' 5.5" (1.664 m)  Wt 160 lb (72.576 kg)  BMI 26.21 kg/m2  SpO2 97% Wt Readings from Last 3 Encounters:  10/29/15 160 lb (72.576 kg)  08/04/15 155 lb 8 oz (70.534 kg)  07/27/15 155 lb 8 oz (70.534 kg)     Lab Results  Component Value Date   WBC 4.2 08/11/2015   HGB 12.4 08/11/2015   HCT 36.4 08/11/2015   PLT 132.0* 08/11/2015   GLUCOSE 103* 08/11/2015   CHOL 135 08/11/2015   TRIG 78.0 08/11/2015   HDL 54.70 08/11/2015   LDLCALC 65 08/11/2015   ALT 15 08/11/2015   AST 14 08/11/2015   NA 139 08/11/2015   K 4.7 08/11/2015   CL 103 08/11/2015   CREATININE 0.86 08/11/2015   BUN 13 08/11/2015   CO2 30 08/11/2015   TSH 1.63 08/11/2015   INR 2.5 10/27/2015   HGBA1C 5.7 09/30/2013       Assessment & Plan:   Problem List Items Addressed This Visit    Abnormal liver function tests    Follow liver panel.        Relevant Orders   Hepatic function panel   Aortic valve stenosis    Murmur on exam appears to be c/w aortic valve stenosis.  Sees cardiology.       Atrial fibrillation ([place])    Has a history of afib.  Sees Dr Rockey Situ.  On coumadin.  Followed by cardiology.  Currently doing well.        Carotid stenosis    She continues to decline carotid ultrasound.  Discussed with her today.  Follow.  Continue risk factor modification.        Relevant Orders   Lipid panel   Essential hypertension - Primary    Blood pressure as outlined.  Outside checks improved.  Highest readings on outside checks - 244W systolic.  Continue same medication regimen.  Follow pressures.  Follow metabolic panel.        Relevant Orders   Basic  metabolic panel   Smoking    Discussed the need to quit smoking.  She declines to quit.        Thrombocytopenia ([facility])    Platelet count has been slightly decreased but stable.  Follow cbc.       Relevant Orders   CBC with Differential/Platelet       Einar Pheasant, MD

## 2015-10-29 NOTE — Progress Notes (Signed)
Pre-visit discussion using our clinic review tool. No additional management support is needed unless otherwise documented below in the visit note.  

## 2015-10-31 ENCOUNTER — Encounter: Payer: Self-pay | Admitting: Internal Medicine

## 2015-10-31 NOTE — Assessment & Plan Note (Signed)
Discussed the need to quit smoking.  She declines to quit.

## 2015-10-31 NOTE — Assessment & Plan Note (Signed)
Blood pressure as outlined.  Outside checks improved.  Highest readings on outside checks - 902X systolic.  Continue same medication regimen.  Follow pressures.  Follow metabolic panel.

## 2015-10-31 NOTE — Assessment & Plan Note (Signed)
She continues to decline carotid ultrasound.  Discussed with her today.  Follow.  Continue risk factor modification.

## 2015-10-31 NOTE — Assessment & Plan Note (Signed)
Follow liver panel.  

## 2015-10-31 NOTE — Assessment & Plan Note (Signed)
Platelet count has been slightly decreased but stable.  Follow cbc.

## 2015-10-31 NOTE — Assessment & Plan Note (Signed)
Has a history of afib.  Sees Dr Rockey Situ.  On coumadin.  Followed by cardiology.  Currently doing well.

## 2015-10-31 NOTE — Assessment & Plan Note (Signed)
Murmur on exam appears to be c/w aortic valve stenosis.  Sees cardiology.

## 2015-11-12 ENCOUNTER — Other Ambulatory Visit (INDEPENDENT_AMBULATORY_CARE_PROVIDER_SITE_OTHER): Payer: Medicare Other

## 2015-11-12 DIAGNOSIS — R7989 Other specified abnormal findings of blood chemistry: Secondary | ICD-10-CM | POA: Diagnosis not present

## 2015-11-12 DIAGNOSIS — D696 Thrombocytopenia, unspecified: Secondary | ICD-10-CM | POA: Diagnosis not present

## 2015-11-12 DIAGNOSIS — I6523 Occlusion and stenosis of bilateral carotid arteries: Secondary | ICD-10-CM | POA: Diagnosis not present

## 2015-11-12 DIAGNOSIS — I10 Essential (primary) hypertension: Secondary | ICD-10-CM | POA: Diagnosis not present

## 2015-11-12 DIAGNOSIS — R945 Abnormal results of liver function studies: Secondary | ICD-10-CM

## 2015-11-12 LAB — BASIC METABOLIC PANEL
BUN: 13 mg/dL (ref 6–23)
CO2: 28 mEq/L (ref 19–32)
Calcium: 9.7 mg/dL (ref 8.4–10.5)
Chloride: 107 mEq/L (ref 96–112)
Creatinine, Ser: 0.88 mg/dL (ref 0.40–1.20)
GFR: 64.89 mL/min (ref >60.00)
GLUCOSE: 116 mg/dL — AB (ref 70–99)
POTASSIUM: 4.2 meq/L (ref 3.5–5.1)
SODIUM: 141 meq/L (ref 135–145)

## 2015-11-12 LAB — LIPID PANEL
CHOLESTEROL: 130 mg/dL (ref 0–200)
HDL: 53.7 mg/dL (ref >39.00)
LDL CALC: 66 mg/dL (ref 0–99)
NonHDL: 76.54
Total CHOL/HDL Ratio: 2
Triglycerides: 53 mg/dL (ref 0.0–149.0)
VLDL: 10.6 mg/dL (ref 0.0–40.0)

## 2015-11-12 LAB — CBC WITH DIFFERENTIAL/PLATELET
Basophils Absolute: 0 10*3/uL (ref 0.0–0.1)
Basophils Relative: 0.7 % (ref 0.0–3.0)
EOS PCT: 3.3 % (ref 0.0–5.0)
Eosinophils Absolute: 0.1 10*3/uL (ref 0.0–0.7)
HEMATOCRIT: 34.9 % — AB (ref 36.0–46.0)
HEMOGLOBIN: 11.5 g/dL — AB (ref 12.0–15.0)
Lymphocytes Relative: 30.4 % (ref 12.0–46.0)
Lymphs Abs: 1.1 10*3/uL (ref 0.7–4.0)
MCHC: 32.9 g/dL (ref 30.0–36.0)
MCV: 99.4 fl (ref 78.0–100.0)
MONOS PCT: 8.9 % (ref 3.0–12.0)
Monocytes Absolute: 0.3 10*3/uL (ref 0.1–1.0)
Neutro Abs: 2.1 10*3/uL (ref 1.4–7.7)
Neutrophils Relative %: 56.7 % (ref 43.0–77.0)
Platelets: 102 10*3/uL — ABNORMAL LOW (ref 150.0–400.0)
RBC: 3.51 Mil/uL — AB (ref 3.87–5.11)
RDW: 13.7 % (ref 11.5–15.5)
WBC: 3.6 10*3/uL — AB (ref 4.0–10.5)

## 2015-11-12 LAB — HEPATIC FUNCTION PANEL
ALT: 15 U/L (ref 0–35)
AST: 17 U/L (ref 0–37)
Albumin: 4.1 g/dL (ref 3.5–5.2)
Alkaline Phosphatase: 60 U/L (ref 39–117)
BILIRUBIN TOTAL: 1.3 mg/dL — AB (ref 0.2–1.2)
Bilirubin, Direct: 0.3 mg/dL (ref 0.0–0.3)
TOTAL PROTEIN: 6 g/dL (ref 6.0–8.3)

## 2015-11-15 ENCOUNTER — Other Ambulatory Visit: Payer: Self-pay | Admitting: Internal Medicine

## 2015-11-15 DIAGNOSIS — R739 Hyperglycemia, unspecified: Secondary | ICD-10-CM

## 2015-11-15 DIAGNOSIS — D649 Anemia, unspecified: Secondary | ICD-10-CM

## 2015-11-15 NOTE — Progress Notes (Signed)
Order placed for f/u labs.  

## 2015-11-16 ENCOUNTER — Telehealth: Payer: Self-pay | Admitting: *Deleted

## 2015-11-16 NOTE — Telephone Encounter (Signed)
Patient has requested lab results from 11/12/15.

## 2015-11-16 NOTE — Telephone Encounter (Signed)
Spoke with patient, reviewed labs, scheduled follow up labs and rescheduled Annual Wellness per patients request.

## 2015-11-17 ENCOUNTER — Other Ambulatory Visit: Payer: Medicare Other

## 2015-11-22 ENCOUNTER — Other Ambulatory Visit: Payer: Medicare Other

## 2015-11-26 ENCOUNTER — Other Ambulatory Visit (INDEPENDENT_AMBULATORY_CARE_PROVIDER_SITE_OTHER): Payer: Medicare Other

## 2015-11-26 DIAGNOSIS — R739 Hyperglycemia, unspecified: Secondary | ICD-10-CM | POA: Diagnosis not present

## 2015-11-26 DIAGNOSIS — D649 Anemia, unspecified: Secondary | ICD-10-CM

## 2015-11-26 DIAGNOSIS — Z79899 Other long term (current) drug therapy: Secondary | ICD-10-CM

## 2015-11-26 LAB — CBC WITH DIFFERENTIAL/PLATELET
BASOS ABS: 0 10*3/uL (ref 0.0–0.1)
Basophils Relative: 0.4 % (ref 0.0–3.0)
EOS ABS: 0.2 10*3/uL (ref 0.0–0.7)
Eosinophils Relative: 3.8 % (ref 0.0–5.0)
HEMATOCRIT: 36.7 % (ref 36.0–46.0)
HEMOGLOBIN: 12.2 g/dL (ref 12.0–15.0)
LYMPHS PCT: 25.4 % (ref 12.0–46.0)
Lymphs Abs: 1.1 10*3/uL (ref 0.7–4.0)
MCHC: 33.2 g/dL (ref 30.0–36.0)
MCV: 99.6 fl (ref 78.0–100.0)
MONOS PCT: 6.9 % (ref 3.0–12.0)
Monocytes Absolute: 0.3 10*3/uL (ref 0.1–1.0)
Neutro Abs: 2.9 10*3/uL (ref 1.4–7.7)
Neutrophils Relative %: 63.5 % (ref 43.0–77.0)
Platelets: 113 10*3/uL — ABNORMAL LOW (ref 150.0–400.0)
RBC: 3.68 Mil/uL — AB (ref 3.87–5.11)
RDW: 13.3 % (ref 11.5–15.5)
WBC: 4.5 10*3/uL (ref 4.0–10.5)

## 2015-11-26 LAB — HEMOGLOBIN A1C: HEMOGLOBIN A1C: 5.4 % (ref 4.6–6.5)

## 2015-11-26 LAB — IBC PANEL
IRON: 147 ug/dL — AB (ref 42–145)
SATURATION RATIOS: 35.7 % (ref 20.0–50.0)
TRANSFERRIN: 294 mg/dL (ref 212.0–360.0)

## 2015-11-26 LAB — VITAMIN B12: Vitamin B-12: 226 pg/mL (ref 211–911)

## 2015-11-26 LAB — FERRITIN: FERRITIN: 18.2 ng/mL (ref 10.0–291.0)

## 2015-12-02 ENCOUNTER — Ambulatory Visit: Payer: Medicare Other

## 2015-12-03 ENCOUNTER — Other Ambulatory Visit: Payer: Self-pay | Admitting: Cardiovascular Disease

## 2015-12-07 ENCOUNTER — Ambulatory Visit: Payer: Medicare Other

## 2015-12-08 ENCOUNTER — Ambulatory Visit (INDEPENDENT_AMBULATORY_CARE_PROVIDER_SITE_OTHER): Payer: Medicare Other

## 2015-12-08 DIAGNOSIS — Z5181 Encounter for therapeutic drug level monitoring: Secondary | ICD-10-CM | POA: Diagnosis not present

## 2015-12-08 DIAGNOSIS — Z7901 Long term (current) use of anticoagulants: Secondary | ICD-10-CM | POA: Diagnosis not present

## 2015-12-08 DIAGNOSIS — I4891 Unspecified atrial fibrillation: Secondary | ICD-10-CM

## 2015-12-08 LAB — POCT INR: INR: 2.5

## 2016-01-05 ENCOUNTER — Telehealth: Payer: Self-pay | Admitting: *Deleted

## 2016-01-05 ENCOUNTER — Other Ambulatory Visit (INDEPENDENT_AMBULATORY_CARE_PROVIDER_SITE_OTHER): Payer: Medicare Other

## 2016-01-05 DIAGNOSIS — D696 Thrombocytopenia, unspecified: Secondary | ICD-10-CM

## 2016-01-05 LAB — CBC WITH DIFFERENTIAL/PLATELET
BASOS ABS: 0 10*3/uL (ref 0.0–0.1)
Basophils Relative: 0.6 % (ref 0.0–3.0)
EOS ABS: 0.1 10*3/uL (ref 0.0–0.7)
Eosinophils Relative: 3.8 % (ref 0.0–5.0)
HCT: 35.7 % — ABNORMAL LOW (ref 36.0–46.0)
Hemoglobin: 12.2 g/dL (ref 12.0–15.0)
LYMPHS ABS: 1.3 10*3/uL (ref 0.7–4.0)
LYMPHS PCT: 32.5 % (ref 12.0–46.0)
MCHC: 34.1 g/dL (ref 30.0–36.0)
MCV: 96.8 fl (ref 78.0–100.0)
MONOS PCT: 8.8 % (ref 3.0–12.0)
Monocytes Absolute: 0.3 10*3/uL (ref 0.1–1.0)
NEUTROS ABS: 2.1 10*3/uL (ref 1.4–7.7)
NEUTROS PCT: 54.3 % (ref 43.0–77.0)
PLATELETS: 125 10*3/uL — AB (ref 150.0–400.0)
RBC: 3.69 Mil/uL — AB (ref 3.87–5.11)
RDW: 13.4 % (ref 11.5–15.5)
WBC: 3.9 10*3/uL — ABNORMAL LOW (ref 4.0–10.5)

## 2016-01-05 NOTE — Telephone Encounter (Signed)
Order placed for f/u cbc.   

## 2016-01-05 NOTE — Telephone Encounter (Signed)
Need lab order placed for CBC

## 2016-01-06 ENCOUNTER — Other Ambulatory Visit: Payer: Self-pay | Admitting: Internal Medicine

## 2016-01-06 ENCOUNTER — Encounter: Payer: Self-pay | Admitting: *Deleted

## 2016-01-19 ENCOUNTER — Ambulatory Visit (INDEPENDENT_AMBULATORY_CARE_PROVIDER_SITE_OTHER): Payer: Medicare Other

## 2016-01-19 DIAGNOSIS — Z7901 Long term (current) use of anticoagulants: Secondary | ICD-10-CM

## 2016-01-19 DIAGNOSIS — Z5181 Encounter for therapeutic drug level monitoring: Secondary | ICD-10-CM

## 2016-01-19 DIAGNOSIS — I4891 Unspecified atrial fibrillation: Secondary | ICD-10-CM | POA: Diagnosis not present

## 2016-01-19 LAB — POCT INR: INR: 2.4

## 2016-02-28 ENCOUNTER — Encounter: Payer: Self-pay | Admitting: Internal Medicine

## 2016-02-28 ENCOUNTER — Ambulatory Visit (INDEPENDENT_AMBULATORY_CARE_PROVIDER_SITE_OTHER): Payer: Medicare Other | Admitting: Internal Medicine

## 2016-02-28 VITALS — BP 122/80 | HR 87 | Temp 98.0°F | Resp 18 | Ht 65.5 in | Wt 156.5 lb

## 2016-02-28 DIAGNOSIS — I4891 Unspecified atrial fibrillation: Secondary | ICD-10-CM | POA: Diagnosis not present

## 2016-02-28 DIAGNOSIS — I350 Nonrheumatic aortic (valve) stenosis: Secondary | ICD-10-CM | POA: Diagnosis not present

## 2016-02-28 DIAGNOSIS — E785 Hyperlipidemia, unspecified: Secondary | ICD-10-CM

## 2016-02-28 DIAGNOSIS — L989 Disorder of the skin and subcutaneous tissue, unspecified: Secondary | ICD-10-CM

## 2016-02-28 DIAGNOSIS — D696 Thrombocytopenia, unspecified: Secondary | ICD-10-CM

## 2016-02-28 DIAGNOSIS — Z720 Tobacco use: Secondary | ICD-10-CM

## 2016-02-28 DIAGNOSIS — R945 Abnormal results of liver function studies: Secondary | ICD-10-CM

## 2016-02-28 DIAGNOSIS — F17200 Nicotine dependence, unspecified, uncomplicated: Secondary | ICD-10-CM

## 2016-02-28 DIAGNOSIS — I6523 Occlusion and stenosis of bilateral carotid arteries: Secondary | ICD-10-CM

## 2016-02-28 DIAGNOSIS — I10 Essential (primary) hypertension: Secondary | ICD-10-CM | POA: Diagnosis not present

## 2016-02-28 DIAGNOSIS — D649 Anemia, unspecified: Secondary | ICD-10-CM

## 2016-02-28 DIAGNOSIS — R7989 Other specified abnormal findings of blood chemistry: Secondary | ICD-10-CM

## 2016-02-28 NOTE — Assessment & Plan Note (Signed)
Discussed with her today.  She continues to decline carotid ultrasound.  Continue risk factor modification.

## 2016-02-28 NOTE — Assessment & Plan Note (Signed)
Low cholesterol diet and exercise.  Follow lipid panel and liver function tests.  On pravastatin.   

## 2016-02-28 NOTE — Progress Notes (Signed)
Patient ID: [name] [name], female   DOB: 1928-07-08, 82 y.o.   MRN: [id number]   Subjective:    Patient ID: [name] [name], female    DOB: 07-29-1928, 82 y.o.   MRN: [id number]  HPI  Patient here for a scheduled follow up.  She states she has had cough and nausea over the last few weeks.  Is better now.  Eating.  No cough now.  No wheezing now.  Feels like back to normal.  No abdominal pain or cramping.  No chest pain.  Breathing stable.  Has stopped smoking.  Has not smoked over the last 2-3 weeks.  Bowels stable and regular.  Easy bruising.     Past Medical History  Diagnosis Date  . Osteoarthritis   . Hypertension   . GERD (gastroesophageal reflux disease)   . Hyperlipidemia   . Cholelithiasis   . Chronic kidney disease   . Impaired fasting glucose   . Atrial fibrillation ([place])   . Carotid stenosis   . Chronic low back pain   . Gallstone pancreatitis 11-10-14  . Gallstones    Past Surgical History  Procedure Laterality Date  . Cataract extraction      x2  . Appendectomy    . Cholecystectomy  11-10-14  . Cholecystectomy     Family History  Problem Relation Age of Onset  . Atrial fibrillation Sister   . Atrial fibrillation Brother   . Heart attack Father 24  . Heart failure Sister 78  . Stroke Sister    Social History   Social History  . Marital Status: Widowed    Spouse Name: N/A  . Number of Children: N/A  . Years of Education: N/A   Social History Main Topics  . Smoking status: Current Some Day Smoker -- 0.50 packs/day    Types: Cigarettes    Last Attempt to Quit: 11/04/2014  . Smokeless tobacco: Never Used     Comment: pt stopped two weeks ago  . Alcohol Use: 0.0 oz/week    0 Standard drinks or equivalent per week     Comment: occasional  . Drug Use: No  . Sexual Activity: Not Asked   Other Topics Concern  . None   Social History Narrative    Outpatient Encounter Prescriptions as of 02/28/2016  Medication Sig  . aspirin 81 MG EC  tablet Take 81 mg by mouth daily.    . carvedilol (COREG) 25 MG tablet TAKE 1 TABLET (25 MG TOTAL) BY MOUTH 2 (TWO) TIMES DAILY WITH A MEAL.  . clonazePAM (KLONOPIN) 0.5 MG tablet TAKE 1/2 TO 1 TABLET BY MOUTH EVERY DAY AS NEEDED  . hydrALAZINE (APRESOLINE) 10 MG tablet TAKE 1 TABLET BY MOUTH 3 TIMES A DAY  . losartan (COZAAR) 100 MG tablet TAKE 1 TABLET BY MOUTH DAILY.  Marland Kitchen omeprazole (PRILOSEC) 20 MG capsule Take 20 mg by mouth daily.  . pravastatin (PRAVACHOL) 40 MG tablet TAKE 1 TABLET (40 MG TOTAL) BY MOUTH DAILY.  Marland Kitchen warfarin (COUMADIN) 5 MG tablet TAKE AS DIRECTED BY ANTICOAGULATION CLINIC   No facility-administered encounter medications on file as of 02/28/2016.    Review of Systems  Constitutional: Negative for fever and chills.       Appetite better now.   HENT: Positive for congestion (better now. ). Negative for sinus pressure.   Respiratory: Positive for cough (previous cough.  resolved now. ). Negative for chest tightness and shortness of breath.   Cardiovascular: Negative for chest pain, palpitations and leg  swelling.  Gastrointestinal: Negative for vomiting, abdominal pain and diarrhea.       Previous nausea.  Resolved.    Genitourinary: Negative for dysuria and difficulty urinating.  Musculoskeletal: Negative for back pain and joint swelling.  Skin: Negative for color change and rash.  Neurological: Negative for dizziness, light-headedness and headaches.  Psychiatric/Behavioral: Negative for dysphoric mood and agitation.       Objective:     Blood pressure rechecked by me:  148-150s/78  Physical Exam  Constitutional: She appears well-developed and well-nourished. No distress.  HENT:  Nose: Nose normal.  Mouth/Throat: Oropharynx is clear and moist.  Neck: Neck supple. No thyromegaly present.  Cardiovascular: Normal rate and regular rhythm.   2/6 harsh systolic murmur.  Bilateral carotid bruit.   Pulmonary/Chest: Breath sounds normal. No respiratory distress. She  has no wheezes.  Abdominal: Soft. Bowel sounds are normal. There is no tenderness.  Musculoskeletal: She exhibits no edema or tenderness.  Lymphadenopathy:    She has no cervical adenopathy.  Skin:  Multiple skin lesions lower extremity.  Raised lesions - one on each leg.  Multiple bruises.  Skin - thin.   Psychiatric: She has a normal mood and affect. Her behavior is normal.    BP 122/80 mmHg  Pulse 87  Temp(Src) 98 F (36.7 C) (Oral)  Resp 18  Ht 5' 5.5" (1.664 m)  Wt 156 lb 8 oz (70.988 kg)  BMI 25.64 kg/m2  SpO2 98% Wt Readings from Last 3 Encounters:  02/28/16 156 lb 8 oz (70.988 kg)  10/29/15 160 lb (72.576 kg)  08/04/15 155 lb 8 oz (70.534 kg)     Lab Results  Component Value Date   WBC 3.9* 01/05/2016   HGB 12.2 01/05/2016   HCT 35.7* 01/05/2016   PLT 125.0* 01/05/2016   GLUCOSE 116* 11/12/2015   CHOL 130 11/12/2015   TRIG 53.0 11/12/2015   HDL 53.70 11/12/2015   LDLCALC 66 11/12/2015   ALT 15 11/12/2015   AST 17 11/12/2015   NA 141 11/12/2015   K 4.2 11/12/2015   CL 107 11/12/2015   CREATININE 0.88 11/12/2015   BUN 13 11/12/2015   CO2 28 11/12/2015   TSH 1.63 08/11/2015   INR 2.4 01/19/2016   HGBA1C 5.4 11/26/2015       Assessment & Plan:   Problem List Items Addressed This Visit    Abnormal liver function tests    Recheck liver panel.  Previous ultrasound as outlined.  Had liver cyst on ultrasound.  She declined further w/up.        Anemia    Declines GI evaluation.  Recheck cbc.       Aortic valve stenosis    Murmur on exam appears to be c/w aortic stenosis.  No symptom change.  Schedule f/u with cardiology.  Question of need for f/u echo.       Atrial fibrillation ([place])    Has a history of afib.  Rate controlled. On coumadin.  Overdue f/u with Dr Rockey Situ per pt.  Schedule f/u appt.        Relevant Orders   Ambulatory referral to Cardiology   Carotid stenosis    Discussed with her today.  She continues to decline carotid ultrasound.   Continue risk factor modification.       Relevant Orders   Ambulatory referral to Cardiology   Essential hypertension - Primary    Blood pressure elevated here.  Outside checks under good control.  Continue same medication regimen.  Follow pressures.  Follow metabolic panel.        Relevant Orders   Basic metabolic panel   Hyperlipidemia    Low cholesterol diet and exercise.  Follow lipid panel and liver function tests.  On pravastatin.       Relevant Orders   Lipid panel   Hepatic function panel   Leg lesion    Raised lesions on legs as outlined.  She declines further w/up or referral to dermatology.        Smoking    She stopped smoking within the last two weeks.  Follow.       Thrombocytopenia (HCC)    Platelet count is decreased.  She has declined further evaluation.  Recheck cbc with next labs.       Relevant Orders   CBC with Differential/Platelet       Einar Pheasant, MD

## 2016-02-28 NOTE — Assessment & Plan Note (Signed)
Has a history of afib.  Rate controlled. On coumadin.  Overdue f/u with Dr Rockey Situ per pt.  Schedule f/u appt.

## 2016-02-28 NOTE — Progress Notes (Signed)
Pre-visit discussion using our clinic review tool. No additional management support is needed unless otherwise documented below in the visit note.  

## 2016-02-28 NOTE — Assessment & Plan Note (Signed)
Blood pressure elevated here.  Outside checks under good control.  Continue same medication regimen.  Follow pressures.  Follow metabolic panel.

## 2016-02-28 NOTE — Assessment & Plan Note (Signed)
Platelet count is decreased.  She has declined further evaluation.  Recheck cbc with next labs.

## 2016-02-28 NOTE — Assessment & Plan Note (Signed)
Declines GI evaluation.  Recheck cbc.

## 2016-02-28 NOTE — Assessment & Plan Note (Signed)
Murmur on exam appears to be c/w aortic stenosis.  No symptom change.  Schedule f/u with cardiology.  Question of need for f/u echo.

## 2016-02-28 NOTE — Assessment & Plan Note (Addendum)
Recheck liver panel.  Previous ultrasound as outlined.  Had liver cyst on ultrasound.  She declined further w/up.

## 2016-02-28 NOTE — Assessment & Plan Note (Signed)
Raised lesions on legs as outlined.  She declines further w/up or referral to dermatology.

## 2016-02-29 ENCOUNTER — Encounter: Payer: Self-pay | Admitting: Internal Medicine

## 2016-02-29 NOTE — Assessment & Plan Note (Signed)
She stopped smoking within the last two weeks.  Follow.

## 2016-03-01 ENCOUNTER — Ambulatory Visit (INDEPENDENT_AMBULATORY_CARE_PROVIDER_SITE_OTHER): Payer: Medicare Other

## 2016-03-01 DIAGNOSIS — I4891 Unspecified atrial fibrillation: Secondary | ICD-10-CM | POA: Diagnosis not present

## 2016-03-01 DIAGNOSIS — Z7901 Long term (current) use of anticoagulants: Secondary | ICD-10-CM

## 2016-03-01 DIAGNOSIS — Z5181 Encounter for therapeutic drug level monitoring: Secondary | ICD-10-CM | POA: Diagnosis not present

## 2016-03-01 LAB — POCT INR: INR: 4.3

## 2016-03-07 ENCOUNTER — Ambulatory Visit (INDEPENDENT_AMBULATORY_CARE_PROVIDER_SITE_OTHER): Payer: Medicare Other | Admitting: Cardiovascular Disease

## 2016-03-07 ENCOUNTER — Encounter: Payer: Self-pay | Admitting: Cardiovascular Disease

## 2016-03-07 ENCOUNTER — Encounter (INDEPENDENT_AMBULATORY_CARE_PROVIDER_SITE_OTHER): Payer: Self-pay

## 2016-03-07 VITALS — BP 150/80 | HR 77 | Ht 65.5 in | Wt 158.5 lb

## 2016-03-07 DIAGNOSIS — I350 Nonrheumatic aortic (valve) stenosis: Secondary | ICD-10-CM | POA: Diagnosis not present

## 2016-03-07 DIAGNOSIS — I10 Essential (primary) hypertension: Secondary | ICD-10-CM | POA: Diagnosis not present

## 2016-03-07 DIAGNOSIS — I4891 Unspecified atrial fibrillation: Secondary | ICD-10-CM

## 2016-03-07 DIAGNOSIS — I6523 Occlusion and stenosis of bilateral carotid arteries: Secondary | ICD-10-CM | POA: Diagnosis not present

## 2016-03-07 DIAGNOSIS — F17200 Nicotine dependence, unspecified, uncomplicated: Secondary | ICD-10-CM

## 2016-03-07 DIAGNOSIS — Z720 Tobacco use: Secondary | ICD-10-CM

## 2016-03-07 DIAGNOSIS — E785 Hyperlipidemia, unspecified: Secondary | ICD-10-CM

## 2016-03-07 MED ORDER — CARVEDILOL 12.5 MG PO TABS: 12.5000 mg | ORAL_TABLET | Freq: Two times a day (BID) | ORAL | Status: DC

## 2016-03-07 NOTE — Assessment & Plan Note (Signed)
Long discussion today concerning her carotid disease She does not want a study now, will consider a study in follow-up 6 months Last study was approximately 2 years ago, 60-79% bilateral carotid disease. Bruits appreciated

## 2016-03-07 NOTE — Assessment & Plan Note (Signed)
Cholesterol is at goal on the current lipid regimen. No changes to the medications were made.  

## 2016-03-07 NOTE — Assessment & Plan Note (Signed)
Murmur on exam likely secondary to aortic valve stenosis, mild We will monitor with periodic echocardiogram if she will allow. She has been very reluctant to do any procedures or imaging. priority should be for carotid ultrasound

## 2016-03-07 NOTE — Assessment & Plan Note (Signed)
We have encouraged her to continue to work on weaning her cigarettes and smoking cessation. She will continue to work on this and does not want any assistance with chantix.    Total encounter time more than 25 minutes  Greater than 50% was spent in counseling and coordination of care with the patient

## 2016-03-07 NOTE — Progress Notes (Signed)
Patient ID: [name] [name], female    DOB: May 04, 1928, 82 y.o.   MRN: [id number]  HPI Comments: 82 yo with history of chronic atrial fibrillation, HTN, 60-79% bilateral carotid stenosis, continued smoking presents for followup of her carotid disease and atrial fibrillation.    In follow-up today, she denies any symptoms of chest pain or shortness of breath concerning for angina She reports that she stop smoking We discussed her 60-79% bilateral carotid arterial disease, as on her last clinic visit, she is currently not interested in repeat carotid ultrasound. Blood pressure elevated 631 systolic, 497 on recheck She reports that she takes carvedilol 25 mg in the morning, none in the evening. She does not take her hydralazine in regular fashion Sometimes feels lightheaded in the mornings Does not monitor her numbers at home  She does report having recent upper respiratory infection, feels weaker  Gall bladder out end of 2016, took some time to recover Status post laparoscopic cholecystectomy  EKG on today's visit shows atrial fibrillation with ventricular rate 77 bpm, no significant ST or T-wave changes  Other past medical history Carotid ultrasound showing 60-79 percent bilateral disease in April 2015 Cholesterol is less than 150   Previous blood work last year showed elevated creatinine. This has improved on lower chlorthalidone  Echo in 7/11 showed preserved EF with mild mitral regurgitaiton and mild pulmonary hypertension (PA systolic pressure 42 mmHg).       No Known Allergies  Outpatient Encounter Prescriptions as of 03/07/2016  Medication Sig  . aspirin 81 MG EC tablet Take 81 mg by mouth daily.    . carvedilol (COREG) 25 MG tablet TAKE 1 TABLET (25 MG TOTAL) BY MOUTH 2 (TWO) TIMES DAILY WITH A MEAL.  . clonazePAM (KLONOPIN) 0.5 MG tablet TAKE 1/2 TO 1 TABLET BY MOUTH EVERY DAY AS NEEDED  . hydrALAZINE (APRESOLINE) 10 MG tablet TAKE 1 TABLET BY MOUTH 3 TIMES A DAY  .  losartan (COZAAR) 100 MG tablet TAKE 1 TABLET BY MOUTH DAILY.  Marland Kitchen omeprazole (PRILOSEC) 20 MG capsule Take 20 mg by mouth daily.  . pravastatin (PRAVACHOL) 40 MG tablet TAKE 1 TABLET (40 MG TOTAL) BY MOUTH DAILY.  Marland Kitchen warfarin (COUMADIN) 5 MG tablet TAKE AS DIRECTED BY ANTICOAGULATION CLINIC   No facility-administered encounter medications on file as of 03/07/2016.    Past Medical History  Diagnosis Date  . Osteoarthritis   . Hypertension   . GERD (gastroesophageal reflux disease)   . Hyperlipidemia   . Cholelithiasis   . Chronic kidney disease   . Impaired fasting glucose   . Atrial fibrillation ([place])   . Carotid stenosis   . Chronic low back pain   . Gallstone pancreatitis 11-10-14  . Gallstones     Past Surgical History  Procedure Laterality Date  . Cataract extraction      x2  . Appendectomy    . Cholecystectomy  11-10-14  . Cholecystectomy      Social History  reports that she quit smoking about 16 months ago. Her smoking use included Cigarettes. She smoked 0.50 packs per day. She has never used smokeless tobacco. She reports that she drinks alcohol. She reports that she does not use illicit drugs.  Family History family history includes Atrial fibrillation in her brother and sister; Heart attack (age of onset: 33) in her father; Heart failure (age of onset: 32) in her sister; Stroke in her sister.  Lab Results  Component Value Date   CHOL 130 11/12/2015  HDL 53.70 11/12/2015   LDLCALC 66 11/12/2015   TRIG 53.0 11/12/2015   Review of Systems  Constitutional: Negative.   Respiratory: Negative.   Cardiovascular: Negative.   Gastrointestinal: Negative.   Musculoskeletal: Negative.   Neurological: Negative.   Hematological: Negative.   Psychiatric/Behavioral: Negative.   All other systems reviewed and are negative.  BP 160/80 mmHg  Pulse 77  Ht 5' 5.5" (1.664 m)  Wt 158 lb 8 oz (71.895 kg)  BMI 25.97 kg/m2  Physical Exam  Constitutional: She is oriented  to person, place, and time. She appears well-developed and well-nourished.  HENT:  Head: Normocephalic.  Nose: Nose normal.  Mouth/Throat: Oropharynx is clear and moist.  Eyes: Conjunctivae are normal. Pupils are equal, round, and reactive to light.  Neck: Normal range of motion. Neck supple. No JVD present. Carotid bruit is present.  Cardiovascular: Normal rate, regular rhythm, S1 normal, S2 normal and intact distal pulses.  Exam reveals no gallop and no friction rub.   Murmur heard.  Crescendo systolic murmur is present with a grade of 2/6  Trace edema around the ankles bilaterally  Pulmonary/Chest: Effort normal and breath sounds normal. No respiratory distress. She has no wheezes. She has no rales. She exhibits no tenderness.  Abdominal: Soft. Bowel sounds are normal. She exhibits no distension. There is no tenderness.  Musculoskeletal: Normal range of motion. She exhibits no edema or tenderness.  Lymphadenopathy:    She has no cervical adenopathy.  Neurological: She is alert and oriented to person, place, and time. Coordination normal.  Skin: Skin is warm and dry. No rash noted. No erythema.  Psychiatric: She has a normal mood and affect. Her behavior is normal. Judgment and thought content normal.    Assessment and Plan  Nursing note and vitals reviewed.

## 2016-03-07 NOTE — Patient Instructions (Addendum)
You are doing well. No medication changes were made.  Ok to stop aspirin if you want   ultrasound of the carotids, to prevent stroke  Schedule on your next visit here  Please monitor your blood pressure at home  Please call us if you have new issues that need to be addressed before your next appt.  Your physician wants you to follow-up in: 6 months.  You will receive a reminder letter in the mail two months in advance. If you don't receive a letter, please call our office to schedule the follow-up appointment.

## 2016-03-07 NOTE — Assessment & Plan Note (Signed)
Heart rate relatively well controlled. Tolerating anticoagulation. 

## 2016-03-07 NOTE — Assessment & Plan Note (Addendum)
Blood pressure elevated on today's visit,  Recommended that she monitor her blood pressure at home Some confusion with her medications, she is taking carvedilol 25 mg in the morning, none at night Takes too hydralazine at nighttime Recommended she decrease the carvedilol down to 12.5 mg twice a day, monitor her blood pressure at home. Given her compliance issues, hydralazine may not be a good medication. Other options include amlodipine, Cardura, clonidine

## 2016-03-15 ENCOUNTER — Ambulatory Visit (INDEPENDENT_AMBULATORY_CARE_PROVIDER_SITE_OTHER): Payer: Medicare Other

## 2016-03-15 ENCOUNTER — Telehealth: Payer: Self-pay | Admitting: Cardiovascular Disease

## 2016-03-15 DIAGNOSIS — Z7901 Long term (current) use of anticoagulants: Secondary | ICD-10-CM

## 2016-03-15 DIAGNOSIS — I4891 Unspecified atrial fibrillation: Secondary | ICD-10-CM

## 2016-03-15 DIAGNOSIS — Z5181 Encounter for therapeutic drug level monitoring: Secondary | ICD-10-CM | POA: Diagnosis not present

## 2016-03-15 LAB — POCT INR: INR: 2.8

## 2016-03-15 NOTE — Telephone Encounter (Signed)
Patient just wants to [facility] that [facility] sent in Rx that she doesn't need right now.  Patient has 6 m supply at home.

## 2016-03-16 ENCOUNTER — Other Ambulatory Visit: Payer: Medicare Other

## 2016-03-20 ENCOUNTER — Other Ambulatory Visit: Payer: Self-pay | Admitting: Cardiovascular Disease

## 2016-03-20 NOTE — Telephone Encounter (Signed)
Please review for refill. Thanks!  

## 2016-03-23 ENCOUNTER — Other Ambulatory Visit (INDEPENDENT_AMBULATORY_CARE_PROVIDER_SITE_OTHER): Payer: Medicare Other

## 2016-03-23 DIAGNOSIS — I10 Essential (primary) hypertension: Secondary | ICD-10-CM | POA: Diagnosis not present

## 2016-03-23 DIAGNOSIS — E785 Hyperlipidemia, unspecified: Secondary | ICD-10-CM

## 2016-03-23 DIAGNOSIS — D696 Thrombocytopenia, unspecified: Secondary | ICD-10-CM

## 2016-03-23 LAB — CBC WITH DIFFERENTIAL/PLATELET
BASOS ABS: 0 10*3/uL (ref 0.0–0.1)
Basophils Relative: 0.6 % (ref 0.0–3.0)
EOS ABS: 0.2 10*3/uL (ref 0.0–0.7)
Eosinophils Relative: 4 % (ref 0.0–5.0)
HCT: 34.8 % — ABNORMAL LOW (ref 36.0–46.0)
Hemoglobin: 11.9 g/dL — ABNORMAL LOW (ref 12.0–15.0)
LYMPHS ABS: 1.2 10*3/uL (ref 0.7–4.0)
Lymphocytes Relative: 29.8 % (ref 12.0–46.0)
MCHC: 34.2 g/dL (ref 30.0–36.0)
MCV: 97.4 fl (ref 78.0–100.0)
MONO ABS: 0.4 10*3/uL (ref 0.1–1.0)
MONOS PCT: 9.2 % (ref 3.0–12.0)
NEUTROS ABS: 2.3 10*3/uL (ref 1.4–7.7)
NEUTROS PCT: 56.4 % (ref 43.0–77.0)
PLATELETS: 115 10*3/uL — AB (ref 150.0–400.0)
RBC: 3.58 Mil/uL — AB (ref 3.87–5.11)
RDW: 14 % (ref 11.5–15.5)
WBC: 4.2 10*3/uL (ref 4.0–10.5)

## 2016-03-23 LAB — BASIC METABOLIC PANEL
BUN: 13 mg/dL (ref 6–23)
CHLORIDE: 104 meq/L (ref 96–112)
CO2: 29 meq/L (ref 19–32)
CREATININE: 0.9 mg/dL (ref 0.40–1.20)
Calcium: 10.2 mg/dL (ref 8.4–10.5)
GFR: 63.17 mL/min (ref >60.00)
Glucose, Bld: 111 mg/dL — ABNORMAL HIGH (ref 70–99)
Potassium: 4.8 mEq/L (ref 3.5–5.1)
SODIUM: 139 meq/L (ref 135–145)

## 2016-03-23 LAB — LIPID PANEL
CHOL/HDL RATIO: 3
Cholesterol: 141 mg/dL (ref 0–200)
HDL: 54.9 mg/dL (ref >39.00)
LDL Cholesterol: 71 mg/dL (ref 0–99)
NonHDL: 86.07
TRIGLYCERIDES: 74 mg/dL (ref 0.0–149.0)
VLDL: 14.8 mg/dL (ref 0.0–40.0)

## 2016-03-23 LAB — HEPATIC FUNCTION PANEL
ALBUMIN: 4.2 g/dL (ref 3.5–5.2)
ALK PHOS: 55 U/L (ref 39–117)
ALT: 15 U/L (ref 0–35)
AST: 19 U/L (ref 0–37)
Bilirubin, Direct: 0.3 mg/dL (ref 0.0–0.3)
TOTAL PROTEIN: 6.4 g/dL (ref 6.0–8.3)
Total Bilirubin: 1.4 mg/dL — ABNORMAL HIGH (ref 0.2–1.2)

## 2016-03-25 ENCOUNTER — Other Ambulatory Visit: Payer: Self-pay | Admitting: Internal Medicine

## 2016-03-25 DIAGNOSIS — D696 Thrombocytopenia, unspecified: Secondary | ICD-10-CM

## 2016-03-25 NOTE — Progress Notes (Signed)
Order placed for f/u cbc.   

## 2016-03-31 ENCOUNTER — Other Ambulatory Visit: Payer: Self-pay | Admitting: Internal Medicine

## 2016-04-07 ENCOUNTER — Other Ambulatory Visit (INDEPENDENT_AMBULATORY_CARE_PROVIDER_SITE_OTHER): Payer: Medicare Other

## 2016-04-07 ENCOUNTER — Telehealth: Payer: Self-pay | Admitting: *Deleted

## 2016-04-07 DIAGNOSIS — D696 Thrombocytopenia, unspecified: Secondary | ICD-10-CM | POA: Diagnosis not present

## 2016-04-07 NOTE — Telephone Encounter (Signed)
I received a call from [facility] lab. The wrong tube was collected (green top) this morning. Pt needed a purple top tube drawn.  I have left her a message to call the office to setup an appt for a redraw & I also let her know that there will be no additional charges.

## 2016-04-07 NOTE — Telephone Encounter (Signed)
Patient scheduled for June 5 at 10:45am (lab)

## 2016-04-07 NOTE — Telephone Encounter (Signed)
FYI-see below- 

## 2016-04-09 ENCOUNTER — Other Ambulatory Visit: Payer: Self-pay | Admitting: Internal Medicine

## 2016-04-12 ENCOUNTER — Other Ambulatory Visit: Payer: Self-pay | Admitting: Internal Medicine

## 2016-04-12 ENCOUNTER — Ambulatory Visit (INDEPENDENT_AMBULATORY_CARE_PROVIDER_SITE_OTHER): Payer: Medicare Other

## 2016-04-12 DIAGNOSIS — Z5181 Encounter for therapeutic drug level monitoring: Secondary | ICD-10-CM | POA: Diagnosis not present

## 2016-04-12 DIAGNOSIS — Z7901 Long term (current) use of anticoagulants: Secondary | ICD-10-CM

## 2016-04-12 DIAGNOSIS — I4891 Unspecified atrial fibrillation: Secondary | ICD-10-CM

## 2016-04-12 LAB — POCT INR: INR: 3.4

## 2016-04-12 NOTE — Telephone Encounter (Signed)
ok'd clonazepam #30 with no refills.

## 2016-04-12 NOTE — Telephone Encounter (Signed)
Refill request for Clonazepam .'5mg'$ , last seen [id number], last filled [id number].  Please advise.

## 2016-04-13 NOTE — Telephone Encounter (Signed)
Faxed to [facility] [address]

## 2016-04-17 ENCOUNTER — Other Ambulatory Visit (INDEPENDENT_AMBULATORY_CARE_PROVIDER_SITE_OTHER): Payer: Medicare Other

## 2016-04-17 DIAGNOSIS — D696 Thrombocytopenia, unspecified: Secondary | ICD-10-CM

## 2016-04-17 LAB — CBC WITH DIFFERENTIAL/PLATELET
BASOS ABS: 0 10*3/uL (ref 0.0–0.1)
Basophils Relative: 0.7 % (ref 0.0–3.0)
EOS ABS: 0.2 10*3/uL (ref 0.0–0.7)
Eosinophils Relative: 3.5 % (ref 0.0–5.0)
HEMATOCRIT: 35 % — AB (ref 36.0–46.0)
Hemoglobin: 11.9 g/dL — ABNORMAL LOW (ref 12.0–15.0)
Lymphocytes Relative: 29.1 % (ref 12.0–46.0)
Lymphs Abs: 1.3 10*3/uL (ref 0.7–4.0)
MCHC: 34.2 g/dL (ref 30.0–36.0)
MCV: 97.9 fl (ref 78.0–100.0)
Monocytes Absolute: 0.4 10*3/uL (ref 0.1–1.0)
Monocytes Relative: 9 % (ref 3.0–12.0)
NEUTROS ABS: 2.5 10*3/uL (ref 1.4–7.7)
NEUTROS PCT: 57.7 % (ref 43.0–77.0)
PLATELETS: 128 10*3/uL — AB (ref 150.0–400.0)
RBC: 3.57 Mil/uL — AB (ref 3.87–5.11)
RDW: 14.1 % (ref 11.5–15.5)
WBC: 4.4 10*3/uL (ref 4.0–10.5)

## 2016-04-17 NOTE — Addendum Note (Signed)
Addended by: Frutoso Chase A on: 04/17/2016 10:45 AM   Modules accepted: Orders

## 2016-05-03 ENCOUNTER — Ambulatory Visit (INDEPENDENT_AMBULATORY_CARE_PROVIDER_SITE_OTHER): Payer: Medicare Other

## 2016-05-03 DIAGNOSIS — Z7901 Long term (current) use of anticoagulants: Secondary | ICD-10-CM | POA: Diagnosis not present

## 2016-05-03 DIAGNOSIS — Z5181 Encounter for therapeutic drug level monitoring: Secondary | ICD-10-CM | POA: Diagnosis not present

## 2016-05-03 DIAGNOSIS — I4891 Unspecified atrial fibrillation: Secondary | ICD-10-CM

## 2016-05-03 LAB — POCT INR: INR: 2.5

## 2016-05-23 ENCOUNTER — Other Ambulatory Visit: Payer: Self-pay | Admitting: Internal Medicine

## 2016-05-31 ENCOUNTER — Ambulatory Visit (INDEPENDENT_AMBULATORY_CARE_PROVIDER_SITE_OTHER): Payer: Medicare Other

## 2016-05-31 DIAGNOSIS — Z7901 Long term (current) use of anticoagulants: Secondary | ICD-10-CM

## 2016-05-31 DIAGNOSIS — Z5181 Encounter for therapeutic drug level monitoring: Secondary | ICD-10-CM

## 2016-05-31 DIAGNOSIS — I4891 Unspecified atrial fibrillation: Secondary | ICD-10-CM

## 2016-05-31 LAB — POCT INR: INR: 2.1

## 2016-06-08 ENCOUNTER — Ambulatory Visit (INDEPENDENT_AMBULATORY_CARE_PROVIDER_SITE_OTHER): Payer: Medicare Other | Admitting: Internal Medicine

## 2016-06-08 ENCOUNTER — Encounter: Payer: Self-pay | Admitting: Internal Medicine

## 2016-06-08 VITALS — BP 110/60 | HR 64 | Temp 98.5°F | Resp 17 | Ht 65.5 in | Wt 157.5 lb

## 2016-06-08 DIAGNOSIS — R7989 Other specified abnormal findings of blood chemistry: Secondary | ICD-10-CM

## 2016-06-08 DIAGNOSIS — R945 Abnormal results of liver function studies: Secondary | ICD-10-CM

## 2016-06-08 DIAGNOSIS — I350 Nonrheumatic aortic (valve) stenosis: Secondary | ICD-10-CM

## 2016-06-08 DIAGNOSIS — I4891 Unspecified atrial fibrillation: Secondary | ICD-10-CM

## 2016-06-08 DIAGNOSIS — E785 Hyperlipidemia, unspecified: Secondary | ICD-10-CM | POA: Diagnosis not present

## 2016-06-08 DIAGNOSIS — I6523 Occlusion and stenosis of bilateral carotid arteries: Secondary | ICD-10-CM

## 2016-06-08 DIAGNOSIS — D696 Thrombocytopenia, unspecified: Secondary | ICD-10-CM

## 2016-06-08 DIAGNOSIS — R739 Hyperglycemia, unspecified: Secondary | ICD-10-CM

## 2016-06-08 DIAGNOSIS — D649 Anemia, unspecified: Secondary | ICD-10-CM

## 2016-06-08 DIAGNOSIS — I10 Essential (primary) hypertension: Secondary | ICD-10-CM | POA: Diagnosis not present

## 2016-06-08 DIAGNOSIS — K7689 Other specified diseases of liver: Secondary | ICD-10-CM

## 2016-06-08 NOTE — Progress Notes (Signed)
Pre-visit discussion using our clinic review tool. No additional management support is needed unless otherwise documented below in the visit note.  

## 2016-06-08 NOTE — Progress Notes (Signed)
Patient ID: [name] [name], female   DOB: Jun 15, 1928, 82 y.o.   MRN: [id number]   Subjective:    Patient ID: [name] [name], female    DOB: 05/04/1928, 82 y.o.   MRN: [id number]  HPI  Patient here for a scheduled follow up. She feels good.  Stays active.  No chest pain.  She has stopped smoking. States has no desire for a cigarette now.  Breathing stable.  No sob.  No acid reflux.  No abdominal pain or cramping.  Bowels stable.  Overall feels good.    Past Medical History:  Diagnosis Date  . Atrial fibrillation ([place])   . Carotid stenosis   . Cholelithiasis   . Chronic kidney disease   . Chronic low back pain   . Gallstone pancreatitis 11-10-14  . Gallstones   . GERD (gastroesophageal reflux disease)   . Hyperlipidemia   . Hypertension   . Impaired fasting glucose   . Osteoarthritis    Past Surgical History:  Procedure Laterality Date  . APPENDECTOMY    . CATARACT EXTRACTION     x2  . CHOLECYSTECTOMY  11-10-14  . CHOLECYSTECTOMY     Family History  Problem Relation Age of Onset  . Heart attack Father 29  . Atrial fibrillation Sister   . Atrial fibrillation Brother   . Heart failure Sister 56  . Stroke Sister    Social History   Social History  . Marital status: Widowed    Spouse name: N/A  . Number of children: N/A  . Years of education: N/A   Social History Main Topics  . Smoking status: Former Smoker    Packs/day: 0.50    Types: Cigarettes    Quit date: 11/04/2014  . Smokeless tobacco: Never Used     Comment: pt stopped two weeks ago  . Alcohol use 0.0 oz/week     Comment: occasional  . Drug use: No  . Sexual activity: Not Asked   Other Topics Concern  . None   Social History Narrative  . None    Outpatient Encounter Prescriptions as of 06/08/2016  Medication Sig  . carvedilol (COREG) 25 MG tablet TAKE 1 TABLET (25 MG TOTAL) BY MOUTH 2 (TWO) TIMES DAILY WITH A MEAL. (Patient taking differently: TAKE 1/2 TABLET (12.5 MG TOTAL) BY MOUTH 2  (TWO) TIMES DAILY WITH A MEAL.)  . clonazePAM (KLONOPIN) 0.5 MG tablet TAKE 1/2 TO 1 TABLET BY MOUTH EVERY DAY AS NEEDED  . hydrALAZINE (APRESOLINE) 10 MG tablet TAKE 1 TABLET BY MOUTH 3 TIMES A DAY  . losartan (COZAAR) 100 MG tablet TAKE 1 TABLET BY MOUTH DAILY.  Marland Kitchen omeprazole (PRILOSEC) 20 MG capsule Take 20 mg by mouth daily.  . pravastatin (PRAVACHOL) 40 MG tablet TAKE 1 TABLET (40 MG TOTAL) BY MOUTH DAILY.  Marland Kitchen warfarin (COUMADIN) 5 MG tablet TAKE AS DIRECTED BY ANTICOAGULATION CLINIC  . [DISCONTINUED] aspirin 81 MG EC tablet Take 81 mg by mouth daily.    . [DISCONTINUED] carvedilol (COREG) 12.5 MG tablet Take 1 tablet (12.5 mg total) by mouth 2 (two) times daily with a meal.   No facility-administered encounter medications on file as of 06/08/2016.     Review of Systems  Constitutional: Negative for appetite change and unexpected weight change.  HENT: Negative for congestion and sinus pressure.   Respiratory: Negative for cough, chest tightness and shortness of breath.   Cardiovascular: Negative for chest pain, palpitations and leg swelling.  Gastrointestinal: Negative for abdominal pain, diarrhea, nausea  and vomiting.  Genitourinary: Negative for difficulty urinating and dysuria.  Musculoskeletal: Negative for back pain and joint swelling.  Skin: Negative for color change and rash.  Neurological: Negative for dizziness, light-headedness and headaches.  Psychiatric/Behavioral: Negative for agitation and dysphoric mood.       Objective:     Blood pressure rechecked by me:  136/78  Physical Exam  Constitutional: She appears well-developed and well-nourished. No distress.  HENT:  Nose: Nose normal.  Mouth/Throat: Oropharynx is clear and moist.  Neck: Neck supple. No thyromegaly present.  Cardiovascular: Normal rate and regular rhythm.   2/6 systolic murmur.    Pulmonary/Chest: Breath sounds normal. No respiratory distress. She has no wheezes.  Abdominal: Soft. Bowel sounds are  normal. There is no tenderness.  Musculoskeletal: She exhibits no edema or tenderness.  Lymphadenopathy:    She has no cervical adenopathy.  Skin: No rash noted. No erythema.  Psychiatric: She has a normal mood and affect. Her behavior is normal.    BP 110/60 (BP Location: Right Arm, Patient Position: Sitting, Cuff Size: Normal)   Pulse 64   Temp 98.5 F (36.9 C) (Oral)   Resp 17   Ht 5' 5.5" (1.664 m)   Wt 157 lb 8 oz (71.4 kg)   SpO2 93%   BMI 25.81 kg/m  Wt Readings from Last 3 Encounters:  06/08/16 157 lb 8 oz (71.4 kg)  03/07/16 158 lb 8 oz (71.9 kg)  02/28/16 156 lb 8 oz (71 kg)     Lab Results  Component Value Date   WBC 4.4 04/17/2016   HGB 11.9 (L) 04/17/2016   HCT 35.0 (L) 04/17/2016   PLT 128.0 (L) 04/17/2016   GLUCOSE 111 (H) 03/23/2016   CHOL 141 03/23/2016   TRIG 74.0 03/23/2016   HDL 54.90 03/23/2016   LDLCALC 71 03/23/2016   ALT 15 03/23/2016   AST 19 03/23/2016   NA 139 03/23/2016   K 4.8 03/23/2016   CL 104 03/23/2016   CREATININE 0.90 03/23/2016   BUN 13 03/23/2016   CO2 29 03/23/2016   TSH 1.63 08/11/2015   INR 2.1 05/31/2016   HGBA1C 5.4 11/26/2015       Assessment & Plan:   Problem List Items Addressed This Visit    Abnormal liver function tests    Last liver panel wnl.  Follow.       Anemia - Primary   Relevant Orders   Ferritin   Aortic valve stenosis    Has the murmur as outlined.  Desired not to pursue ECHO at this time.  See cardiology note.       Atrial fibrillation ([place])    On coumadin.  Followed by cardiology.  Rate controlled.        Carotid stenosis    Has been followed by Dr Rockey Situ.  See his last note.  Desired not to have f/u ultrasound then.  Follow.  Continue risk factor modification.  She has quit smoking.        Essential hypertension    Blood pressure better.  Outside checks ok.  Same medication regimen.  Follow pressures.  Follow metabolic panel.        Relevant Orders   TSH   Basic metabolic panel     Hyperlipidemia    Continue pravastatin.  Low cholesterol diet and exercise.  Follow lipid panel and liver function tests.   Lab Results  Component Value Date   CHOL 141 03/23/2016   HDL 54.90 03/23/2016  LDLCALC 71 03/23/2016   TRIG 74.0 03/23/2016   CHOLHDL 3 03/23/2016        Relevant Orders   Lipid panel   Hepatic function panel   Liver cyst    Found on abdominal ultrasound.  She has declined further w/up.       Thrombocytopenia (HCC)    Platelet count has been decreased, but overall stable. She declined hematology referral.  Follow.       Relevant Orders   CBC with Differential/Platelet    Other Visit Diagnoses    Hyperglycemia       Relevant Orders   Hemoglobin A1c       Einar Pheasant, MD

## 2016-06-10 ENCOUNTER — Encounter: Payer: Self-pay | Admitting: Internal Medicine

## 2016-06-10 NOTE — Assessment & Plan Note (Signed)
Blood pressure better.  Outside checks ok.  Same medication regimen.  Follow pressures.  Follow metabolic panel.

## 2016-06-10 NOTE — Assessment & Plan Note (Signed)
Continue pravastatin.  Low cholesterol diet and exercise.  Follow lipid panel and liver function tests.   Lab Results  Component Value Date   CHOL 141 03/23/2016   HDL 54.90 03/23/2016   LDLCALC 71 03/23/2016   TRIG 74.0 03/23/2016   CHOLHDL 3 03/23/2016

## 2016-06-10 NOTE — Assessment & Plan Note (Signed)
Platelet count has been decreased, but overall stable. She declined hematology referral.  Follow.

## 2016-06-10 NOTE — Assessment & Plan Note (Signed)
Last liver panel wnl.  Follow.

## 2016-06-10 NOTE — Assessment & Plan Note (Signed)
Has been followed by Dr Rockey Situ.  See his last note.  Desired not to have f/u ultrasound then.  Follow.  Continue risk factor modification.  She has quit smoking.

## 2016-06-10 NOTE — Assessment & Plan Note (Signed)
Found on abdominal ultrasound.  She has declined further w/up.

## 2016-06-10 NOTE — Assessment & Plan Note (Signed)
On coumadin.  Followed by cardiology.  Rate controlled.

## 2016-06-10 NOTE — Assessment & Plan Note (Signed)
Has the murmur as outlined.  Desired not to pursue ECHO at this time.  See cardiology note.

## 2016-07-05 ENCOUNTER — Ambulatory Visit (INDEPENDENT_AMBULATORY_CARE_PROVIDER_SITE_OTHER): Payer: Medicare Other

## 2016-07-05 DIAGNOSIS — Z5181 Encounter for therapeutic drug level monitoring: Secondary | ICD-10-CM | POA: Diagnosis not present

## 2016-07-05 DIAGNOSIS — Z7901 Long term (current) use of anticoagulants: Secondary | ICD-10-CM

## 2016-07-05 DIAGNOSIS — I4891 Unspecified atrial fibrillation: Secondary | ICD-10-CM

## 2016-07-05 LAB — POCT INR: INR: 3.2

## 2016-08-02 ENCOUNTER — Ambulatory Visit (INDEPENDENT_AMBULATORY_CARE_PROVIDER_SITE_OTHER): Payer: Medicare Other | Admitting: *Deleted

## 2016-08-02 DIAGNOSIS — Z5181 Encounter for therapeutic drug level monitoring: Secondary | ICD-10-CM | POA: Diagnosis not present

## 2016-08-02 DIAGNOSIS — I4891 Unspecified atrial fibrillation: Secondary | ICD-10-CM

## 2016-08-02 DIAGNOSIS — Z7901 Long term (current) use of anticoagulants: Secondary | ICD-10-CM | POA: Diagnosis not present

## 2016-08-02 LAB — POCT INR: INR: 2.6

## 2016-08-08 ENCOUNTER — Telehealth: Payer: Self-pay | Admitting: Internal Medicine

## 2016-08-08 DIAGNOSIS — M25561 Pain in right knee: Secondary | ICD-10-CM

## 2016-08-08 NOTE — Telephone Encounter (Signed)
Need more information.  How she fell?  Any injury?  Any head injury?  If acute issues, needs to be seen today.   I can refer to ortho and also see her tomorrow if desires.

## 2016-08-08 NOTE — Telephone Encounter (Signed)
Pt daughter stated the right knee gave out twice back to back. Daughter stated mom had not slept well 2-3 nights prior to falling and she had taking tylenol PM before she fell. Daughter stated she had witnessed pts knee give out before. She stated that if a referral for ortho was placed she wanted the pt to see Dr.Miller here in [place], [place].

## 2016-08-08 NOTE — Telephone Encounter (Signed)
Pt daughter called this morning to schedule an appointment for her mom for tomorrow. Since the appointment was made her mom has fallen again. Daughter would like to know if they should keep their appointment for tomorrow or if they should go see an orthopedic doctor. Please Advise. Thank you!   Call Hinton Dyer (daughter) [phone]

## 2016-08-08 NOTE — Telephone Encounter (Signed)
Recommendations for pt.

## 2016-08-09 ENCOUNTER — Ambulatory Visit (INDEPENDENT_AMBULATORY_CARE_PROVIDER_SITE_OTHER): Payer: Medicare Other

## 2016-08-09 ENCOUNTER — Encounter: Payer: Self-pay | Admitting: Family Medicine

## 2016-08-09 ENCOUNTER — Ambulatory Visit (INDEPENDENT_AMBULATORY_CARE_PROVIDER_SITE_OTHER): Payer: Medicare Other | Admitting: Family Medicine

## 2016-08-09 VITALS — BP 140/78 | HR 78 | Temp 98.2°F | Wt 160.2 lb

## 2016-08-09 DIAGNOSIS — M25361 Other instability, right knee: Secondary | ICD-10-CM | POA: Diagnosis not present

## 2016-08-09 DIAGNOSIS — Z23 Encounter for immunization: Secondary | ICD-10-CM

## 2016-08-09 NOTE — Telephone Encounter (Signed)
Spoke to [employer].  States prefer ortho appt.  Does not feel needs evaluation here.  Order placed for referral.  Please notify referral placed and if needs anything or feels needs evaluation here - let us know.

## 2016-08-09 NOTE — Patient Instructions (Signed)
Nice to meet you. We'll get an x-ray of her right knee. You need to keep the appointment with orthopedic doctor tomorrow. If you develop pain or any new or change in symptoms please seek medical attention.

## 2016-08-09 NOTE — Telephone Encounter (Signed)
Pt stated she wanted to be seen in our office as well. She stated shes sore and thought we could get her a x-ray to see what all she could do before seeing ortho.

## 2016-08-09 NOTE — Progress Notes (Signed)
  Tommi Rumps, MD Phone: [phone]  [name] [name] is a 82 y.o. female who presents today for same-day visit.  Patient notes on 3 occasions recently her right knee has buckled and given out underneath her. No pain in the knee. No numbness or weakness. She's been able to walk on the leg other than that 3 episodes of buckling. Notes on one of the occasions she fell and hit the outside of her left leg and twisted her right ankle. She notes being able to bear weight on both legs. She also fell on her buttocks. She does not want her buttocks evaluated today. No history of knee issues in the past per her report. She notes no headaches or loss of consciousness with any of these falls.  ROS see history of present illness  Objective  Physical Exam Vitals:   08/09/16 1433  BP: 140/78  Pulse: 78  Temp: 98.2 F (36.8 C)    BP Readings from Last 3 Encounters:  08/09/16 140/78  06/08/16 110/60  03/07/16 (!) 150/80   Wt Readings from Last 3 Encounters:  08/09/16 160 lb 4 oz (72.7 kg)  06/08/16 157 lb 8 oz (71.4 kg)  03/07/16 158 lb 8 oz (71.9 kg)    Physical Exam  Constitutional: No distress.  HENT:  Head: Normocephalic and atraumatic.  Cardiovascular: Normal rate, regular rhythm and normal heart sounds.   Pulmonary/Chest: Effort normal and breath sounds normal.  Musculoskeletal:  Right knee with no tenderness or swelling, there is no warmth or erythema, no ligamentous laxity, negative McMurray's, left knee with no tenderness or swelling, no warmth or erythema, ligaments laxity, negative McMurray's, no tenderness of any aspect of the bilateral legs below the knees, no ankle tenderness of either malleoli bilaterally, no navicular or fifth had a metatarsal tenderness bilaterally, feet are warm and well perfused with good capillary refill  Neurological: She is alert. Gait normal.  5 out of 5 strength bilateral quads, hamstrings, plantar flexion, and dorsiflexion, sensation to light  touch intact bilateral lower extremities  Skin: She is not diaphoretic.     Assessment/Plan: Please see individual problem list.  Right knee buckling Patient with recurrent right knee buckling. No instability on exam today. No obvious cause. Neurologically intact. We'll obtain x-rays of her right knee to evaluate further. She has no tenderness of her left knee, lower leg, or ankle and thus we'll defer x-rays of these areas at this time given her ability to bear weight easily and lack of discomfort and lack of bony defects. She has an appointment with the orthopedist tomorrow and will keep this. She'll be given a disc with her imaging on it. Tylenol for discomfort. Given return precautions.   Orders Placed This Encounter  Procedures  . DG Knee Complete 4 Views Right    Standing Status:   Future    Number of Occurrences:   1    Standing Expiration Date:   10/09/2017    Order Specific Question:   Reason for Exam (SYMPTOM  OR DIAGNOSIS REQUIRED)    Answer:   right knee giving out on her causing fall    Order Specific Question:   Preferred imaging location?    Answer:   [facility]    Tommi Rumps, MD [place]

## 2016-08-09 NOTE — Assessment & Plan Note (Signed)
Patient with recurrent right knee buckling. No instability on exam today. No obvious cause. Neurologically intact. We'll obtain x-rays of her right knee to evaluate further. She has no tenderness of her left knee, lower leg, or ankle and thus we'll defer x-rays of these areas at this time given her ability to bear weight easily and lack of discomfort and lack of bony defects. She has an appointment with the orthopedist tomorrow and will keep this. She'll be given a disc with her imaging on it. Tylenol for discomfort. Given return precautions.

## 2016-08-11 ENCOUNTER — Telehealth: Payer: Self-pay | Admitting: *Deleted

## 2016-08-11 NOTE — Telephone Encounter (Signed)
Patient daughter has requested a call Dr. Nicki Reaper to discuss patients next step in care. Pt's daughter stated that test results from ortho did not show any problems. Contact Hinton Dyer [phone]

## 2016-08-11 NOTE — Telephone Encounter (Signed)
I would recommend a f/u here for evaluation to see what is needed.  If agreeable, forward to [employer] for work in appt.

## 2016-08-11 NOTE — Telephone Encounter (Signed)
Patients daughter would like to talk to you first before coming in

## 2016-08-11 NOTE — Telephone Encounter (Signed)
Please advise 

## 2016-08-13 NOTE — Telephone Encounter (Signed)
Called [name] [name].  She expressed her concerns.  I explained [name] needs an appt with me.  She will talk with her mother and see if can convince her to come in for appt.

## 2016-08-21 ENCOUNTER — Other Ambulatory Visit: Payer: Self-pay | Admitting: Internal Medicine

## 2016-08-21 NOTE — Telephone Encounter (Signed)
Last filled 04/13/16. Last OV with Dr. Nicki Reaper 06/08/16. Physical scheduled with Dr. Nicki Reaper on 09/11/16.

## 2016-08-24 ENCOUNTER — Encounter: Payer: Self-pay | Admitting: Internal Medicine

## 2016-08-24 ENCOUNTER — Telehealth: Payer: Self-pay | Admitting: Internal Medicine

## 2016-08-24 ENCOUNTER — Ambulatory Visit
Admission: RE | Admit: 2016-08-24 | Discharge: 2016-08-24 | Disposition: A | Discharge status: Home / Self Care | Payer: Medicare Other | Source: Ambulatory Visit | Attending: Internal Medicine | Admitting: Internal Medicine

## 2016-08-24 ENCOUNTER — Ambulatory Visit (INDEPENDENT_AMBULATORY_CARE_PROVIDER_SITE_OTHER): Payer: Medicare Other | Admitting: Internal Medicine

## 2016-08-24 VITALS — BP 154/82 | HR 77 | Temp 97.9°F | Ht 66.0 in | Wt 155.4 lb

## 2016-08-24 DIAGNOSIS — E785 Hyperlipidemia, unspecified: Secondary | ICD-10-CM

## 2016-08-24 DIAGNOSIS — M7989 Other specified soft tissue disorders: Secondary | ICD-10-CM | POA: Insufficient documentation

## 2016-08-24 DIAGNOSIS — I350 Nonrheumatic aortic (valve) stenosis: Secondary | ICD-10-CM

## 2016-08-24 DIAGNOSIS — M79662 Pain in left lower leg: Secondary | ICD-10-CM

## 2016-08-24 DIAGNOSIS — I4891 Unspecified atrial fibrillation: Secondary | ICD-10-CM

## 2016-08-24 DIAGNOSIS — D696 Thrombocytopenia, unspecified: Secondary | ICD-10-CM | POA: Diagnosis not present

## 2016-08-24 DIAGNOSIS — D649 Anemia, unspecified: Secondary | ICD-10-CM

## 2016-08-24 DIAGNOSIS — I10 Essential (primary) hypertension: Secondary | ICD-10-CM

## 2016-08-24 DIAGNOSIS — R29898 Other symptoms and signs involving the musculoskeletal system: Secondary | ICD-10-CM

## 2016-08-24 DIAGNOSIS — F17200 Nicotine dependence, unspecified, uncomplicated: Secondary | ICD-10-CM

## 2016-08-24 DIAGNOSIS — Z7901 Long term (current) use of anticoagulants: Secondary | ICD-10-CM

## 2016-08-24 DIAGNOSIS — I6523 Occlusion and stenosis of bilateral carotid arteries: Secondary | ICD-10-CM

## 2016-08-24 DIAGNOSIS — R7989 Other specified abnormal findings of blood chemistry: Secondary | ICD-10-CM

## 2016-08-24 DIAGNOSIS — R945 Abnormal results of liver function studies: Secondary | ICD-10-CM

## 2016-08-24 NOTE — Progress Notes (Signed)
Pre visit review using our clinic review tool, if applicable. No additional management support is needed unless otherwise documented below in the visit note. 

## 2016-08-24 NOTE — Telephone Encounter (Signed)
Pt called returning your call. Thank you!  Call pt @ [phone]

## 2016-08-24 NOTE — Telephone Encounter (Signed)
Please call pt at [phone]

## 2016-08-24 NOTE — Progress Notes (Signed)
Patient ID: [name] [name], female   DOB: [birth year], 82 y.o.   MRN: [id number]   Subjective:    Patient ID: [name] [name], female    DOB: 05/15/1928, 82 y.o.   MRN: [id number]  HPI  Patient here for a scheduled follow up.  She is accompanied by her daughter.  History obtained from both of them.  She had a recent "fall".  States she had been shopping.  Went to step off the curb and her right leg gave away.  She states she could not feel her leg.  Landed on her left side.  No head injury.  Was evaluated.  Xray negative.  No loss of strength on exam.  She did take 2 tylenol pm the night before.  Not sure if this contributed.  She has noticed persistent left calf pain after fall.  She also reports that the next day a similar episode occurred.  States her left leg felt cold and numb.  Some stinging later.  Has not had another fall or funny sensation.  No confusion.  No slurring of speech.  No headache.  No dizziness.  States her blood pressure has been averaging 140/<80.     Past Medical History:  Diagnosis Date  . Atrial fibrillation ([place])   . Carotid stenosis   . Cholelithiasis   . Chronic kidney disease   . Chronic low back pain   . Gallstone pancreatitis 11-10-14  . Gallstones   . GERD (gastroesophageal reflux disease)   . Hyperlipidemia   . Hypertension   . Impaired fasting glucose   . Osteoarthritis    Past Surgical History:  Procedure Laterality Date  . APPENDECTOMY    . CATARACT EXTRACTION     x2  . CHOLECYSTECTOMY  11-10-14  . CHOLECYSTECTOMY     Family History  Problem Relation Age of Onset  . Heart attack Father 36  . Atrial fibrillation Sister   . Atrial fibrillation Brother   . Heart failure Sister 76  . Stroke Sister    Social History   Social History  . Marital status: Widowed    Spouse name: N/A  . Number of children: N/A  . Years of education: N/A   Social History Main Topics  . Smoking status: Former Smoker    Packs/day: 0.50    Types:  Cigarettes    Quit date: 11/04/2014  . Smokeless tobacco: Never Used     Comment: pt stopped two weeks ago  . Alcohol use 0.0 oz/week     Comment: occasional  . Drug use: No  . Sexual activity: Not Asked   Other Topics Concern  . None   Social History Narrative  . None    Outpatient Encounter Prescriptions as of 08/24/2016  Medication Sig  . carvedilol (COREG) 25 MG tablet TAKE 1 TABLET (25 MG TOTAL) BY MOUTH 2 (TWO) TIMES DAILY WITH A MEAL. (Patient taking differently: TAKE 1/2 TABLET (12.5 MG TOTAL) BY MOUTH 2 (TWO) TIMES DAILY WITH A MEAL.)  . clonazePAM (KLONOPIN) 0.5 MG tablet TAKE ONE-HALF TO 1 TABLET BY MOUTH ONCE DAILY AS NEEDED  . hydrALAZINE (APRESOLINE) 10 MG tablet TAKE 1 TABLET BY MOUTH 3 TIMES A DAY  . losartan (COZAAR) 100 MG tablet TAKE 1 TABLET BY MOUTH DAILY.  Marland Kitchen omeprazole (PRILOSEC) 20 MG capsule Take 20 mg by mouth daily.  . pravastatin (PRAVACHOL) 40 MG tablet TAKE 1 TABLET (40 MG TOTAL) BY MOUTH DAILY.  Marland Kitchen warfarin (COUMADIN) 5 MG tablet TAKE AS DIRECTED  BY ANTICOAGULATION CLINIC   No facility-administered encounter medications on file as of 08/24/2016.     Review of Systems  Constitutional: Negative for appetite change and unexpected weight change.  HENT: Negative for congestion and sinus pressure.   Respiratory: Negative for cough, chest tightness and shortness of breath.   Cardiovascular: Negative for chest pain, palpitations and leg swelling.  Gastrointestinal: Negative for abdominal pain, diarrhea, nausea and vomiting.  Genitourinary: Negative for difficulty urinating and dysuria.  Musculoskeletal:       Left calf pain s/p fall.    Skin: Negative for color change and rash.  Neurological: Negative for dizziness, light-headedness and headaches.  Psychiatric/Behavioral: Negative for agitation and dysphoric mood.       Objective:     Blood pressure rechecked by me:  154/82  Physical Exam  Constitutional: She appears well-developed and  well-nourished. No distress.  HENT:  Nose: Nose normal.  Mouth/Throat: Oropharynx is clear and moist.  Neck: Neck supple. No thyromegaly present.  Cardiovascular: Normal rate and regular rhythm.   Pulmonary/Chest: Breath sounds normal. No respiratory distress. She has no wheezes.  Abdominal: Soft. Bowel sounds are normal. There is no tenderness.  Musculoskeletal: She exhibits no tenderness.  Left leg larger than right.    Lymphadenopathy:    She has no cervical adenopathy.  Skin: No rash noted. No erythema.  Psychiatric: She has a normal mood and affect. Her behavior is normal.    BP (!) 154/82   Pulse 77   Temp 97.9 F (36.6 C) (Oral)   Ht '5\' 6"'$  (1.676 m)   Wt 155 lb 6.4 oz (70.5 kg)   SpO2 97%   BMI 25.08 kg/m  Wt Readings from Last 3 Encounters:  08/24/16 155 lb 6.4 oz (70.5 kg)  08/09/16 160 lb 4 oz (72.7 kg)  06/08/16 157 lb 8 oz (71.4 kg)     Lab Results  Component Value Date   WBC 4.4 04/17/2016   HGB 11.9 (L) 04/17/2016   HCT 35.0 (L) 04/17/2016   PLT 128.0 (L) 04/17/2016   GLUCOSE 111 (H) 03/23/2016   CHOL 141 03/23/2016   TRIG 74.0 03/23/2016   HDL 54.90 03/23/2016   LDLCALC 71 03/23/2016   ALT 15 03/23/2016   AST 19 03/23/2016   NA 139 03/23/2016   K 4.8 03/23/2016   CL 104 03/23/2016   CREATININE 0.90 03/23/2016   BUN 13 03/23/2016   CO2 29 03/23/2016   TSH 1.63 08/11/2015   INR 2.6 08/02/2016   HGBA1C 5.4 11/26/2015       Assessment & Plan:   Problem List Items Addressed This Visit    Abnormal liver function tests    Follow liver function tests.        Anemia    Has declined GI evaluation.  Follow cbc.       Aortic valve stenosis    Needs f/u echo given history of AS and given recent concern over acute leg weakness, etc.        Atrial fibrillation ([place])    On coumadin.  Followed by cardiology.       Carotid stenosis    Has been followed by Dr Rockey Situ.  Given the concern regarding leg weakness and possible TIA - needs f/u carotid  ultrasound.  This will be performed through cardiology.        Essential hypertension    Blood pressure elevated on initial check.  She has not been taking her medication correctly.  Has only been  taking the coreg and the hydralazine q day.  Follow pressures.  Will have her spot check her pressure.  Get her back in soon to reassess.  Rechecked today improved.        Hyperlipidemia    Low cholesterol diet and exercise.  Follow lipid panel and liver function tests.  Continue pravastatin.        Leg weakness    Had the recent fall.  She reported her leg gave away.  Could not feel her leg. Had two episodes.  No episodes since.  Saw ortho.  They were concerned about possible TIA.  Check MRI brain, carotid ultrasound and echo as outlined.  Continue on coumadin.        Relevant Orders   MR Brain W Wo Contrast   Long term current use of anticoagulant    She is on coumadin.  Being followed at coumadin clinic.        Smoking    Needs to stop smoking.  Have discussed with her.  Follow.       Thrombocytopenia ([facility])    Platelet count has been decreased, but overall stable.  She has declined hematology referral.         Other Visit Diagnoses    Left leg swelling    -  Primary   left leg larger than right.  with the tenderness, obtain left lower extremity ultrasound.  on coumadin.     Relevant Orders   US Venous Img Lower Unilateral Left (Completed)   Pain of left calf       Relevant Orders   US Venous Img Lower Unilateral Left (Completed)     I spent 40 minutes with the patient and more than 50% of the time was spent in consultation regarding the above.     Einar Pheasant, MD

## 2016-08-27 ENCOUNTER — Encounter: Payer: Self-pay | Admitting: Internal Medicine

## 2016-08-27 DIAGNOSIS — R29898 Other symptoms and signs involving the musculoskeletal system: Secondary | ICD-10-CM | POA: Insufficient documentation

## 2016-08-27 NOTE — Assessment & Plan Note (Signed)
Had the recent fall.  She reported her leg gave away.  Could not feel her leg. Had two episodes.  No episodes since.  Saw ortho.  They were concerned about possible TIA.  Check MRI brain, carotid ultrasound and echo as outlined.  Continue on coumadin.

## 2016-08-27 NOTE — Assessment & Plan Note (Signed)
She is on coumadin.  Being followed at coumadin clinic.

## 2016-08-27 NOTE — Assessment & Plan Note (Signed)
Has been followed by Dr Rockey Situ.  Given the concern regarding leg weakness and possible TIA - needs f/u carotid ultrasound.  This will be performed through cardiology.

## 2016-08-27 NOTE — Assessment & Plan Note (Signed)
Needs f/u echo given history of AS and given recent concern over acute leg weakness, etc.

## 2016-08-27 NOTE — Assessment & Plan Note (Signed)
Platelet count has been decreased, but overall stable.  She has declined hematology referral.

## 2016-08-27 NOTE — Assessment & Plan Note (Signed)
Low cholesterol diet and exercise.  Follow lipid panel and liver function tests.  Continue pravastatin.

## 2016-08-27 NOTE — Assessment & Plan Note (Signed)
Blood pressure elevated on initial check.  She has not been taking her medication correctly.  Has only been taking the coreg and the hydralazine q day.  Follow pressures.  Will have her spot check her pressure.  Get her back in soon to reassess.  Rechecked today improved.

## 2016-08-27 NOTE — Assessment & Plan Note (Signed)
On coumadin.  Followed by cardiology.

## 2016-08-27 NOTE — Assessment & Plan Note (Signed)
Follow liver function tests.

## 2016-08-27 NOTE — Assessment & Plan Note (Signed)
Has declined GI evaluation.  Follow cbc.

## 2016-08-27 NOTE — Assessment & Plan Note (Signed)
Needs to stop smoking.  Have discussed with her.  Follow.

## 2016-08-28 ENCOUNTER — Other Ambulatory Visit: Payer: Self-pay | Admitting: Internal Medicine

## 2016-08-28 DIAGNOSIS — R29898 Other symptoms and signs involving the musculoskeletal system: Secondary | ICD-10-CM

## 2016-08-28 DIAGNOSIS — I6523 Occlusion and stenosis of bilateral carotid arteries: Secondary | ICD-10-CM

## 2016-08-28 DIAGNOSIS — I4891 Unspecified atrial fibrillation: Secondary | ICD-10-CM

## 2016-08-28 NOTE — Progress Notes (Signed)
Orders placed for carotid ultrasound and echo.

## 2016-09-06 ENCOUNTER — Other Ambulatory Visit (INDEPENDENT_AMBULATORY_CARE_PROVIDER_SITE_OTHER): Payer: Medicare Other

## 2016-09-06 DIAGNOSIS — D696 Thrombocytopenia, unspecified: Secondary | ICD-10-CM

## 2016-09-06 DIAGNOSIS — E785 Hyperlipidemia, unspecified: Secondary | ICD-10-CM | POA: Diagnosis not present

## 2016-09-06 DIAGNOSIS — D649 Anemia, unspecified: Secondary | ICD-10-CM

## 2016-09-06 DIAGNOSIS — I10 Essential (primary) hypertension: Secondary | ICD-10-CM

## 2016-09-06 DIAGNOSIS — R739 Hyperglycemia, unspecified: Secondary | ICD-10-CM

## 2016-09-06 LAB — CBC WITH DIFFERENTIAL/PLATELET
BASOS PCT: 0.5 % (ref 0.0–3.0)
Basophils Absolute: 0 10*3/uL (ref 0.0–0.1)
EOS PCT: 2.7 % (ref 0.0–5.0)
Eosinophils Absolute: 0.2 10*3/uL (ref 0.0–0.7)
HEMATOCRIT: 33.6 % — AB (ref 36.0–46.0)
HEMOGLOBIN: 11.7 g/dL — AB (ref 12.0–15.0)
LYMPHS PCT: 15.9 % (ref 12.0–46.0)
Lymphs Abs: 1 10*3/uL (ref 0.7–4.0)
MCHC: 34.7 g/dL (ref 30.0–36.0)
MCV: 96.5 fl (ref 78.0–100.0)
MONO ABS: 0.7 10*3/uL (ref 0.1–1.0)
Monocytes Relative: 10.4 % (ref 3.0–12.0)
Neutro Abs: 4.6 10*3/uL (ref 1.4–7.7)
Neutrophils Relative %: 70.5 % (ref 43.0–77.0)
Platelets: 135 10*3/uL — ABNORMAL LOW (ref 150.0–400.0)
RBC: 3.48 Mil/uL — AB (ref 3.87–5.11)
RDW: 13.3 % (ref 11.5–15.5)
WBC: 6.5 10*3/uL (ref 4.0–10.5)

## 2016-09-06 LAB — BASIC METABOLIC PANEL
BUN: 16 mg/dL (ref 6–23)
CALCIUM: 10.3 mg/dL (ref 8.4–10.5)
CO2: 28 mEq/L (ref 19–32)
CREATININE: 1.09 mg/dL (ref 0.40–1.20)
Chloride: 102 mEq/L (ref 96–112)
GFR: 50.59 mL/min — AB (ref >60.00)
Glucose, Bld: 143 mg/dL — ABNORMAL HIGH (ref 70–99)
Potassium: 5.1 mEq/L (ref 3.5–5.1)
SODIUM: 137 meq/L (ref 135–145)

## 2016-09-06 LAB — HEPATIC FUNCTION PANEL
ALBUMIN: 4.2 g/dL (ref 3.5–5.2)
ALK PHOS: 116 U/L (ref 39–117)
ALT: 14 U/L (ref 0–35)
AST: 15 U/L (ref 0–37)
Bilirubin, Direct: 0.2 mg/dL (ref 0.0–0.3)
TOTAL PROTEIN: 7 g/dL (ref 6.0–8.3)
Total Bilirubin: 1.1 mg/dL (ref 0.2–1.2)

## 2016-09-06 LAB — LIPID PANEL
CHOLESTEROL: 140 mg/dL (ref 0–200)
HDL: 69.2 mg/dL (ref >39.00)
LDL CALC: 56 mg/dL (ref 0–99)
NONHDL: 70.57
Total CHOL/HDL Ratio: 2
Triglycerides: 75 mg/dL (ref 0.0–149.0)
VLDL: 15 mg/dL (ref 0.0–40.0)

## 2016-09-06 LAB — TSH: TSH: 2.44 u[IU]/mL (ref 0.35–4.50)

## 2016-09-06 LAB — FERRITIN: FERRITIN: 71.5 ng/mL (ref 10.0–291.0)

## 2016-09-06 LAB — HEMOGLOBIN A1C: HEMOGLOBIN A1C: 5.8 % (ref 4.6–6.5)

## 2016-09-07 ENCOUNTER — Encounter: Payer: Self-pay | Admitting: Cardiovascular Disease

## 2016-09-07 ENCOUNTER — Ambulatory Visit (INDEPENDENT_AMBULATORY_CARE_PROVIDER_SITE_OTHER): Payer: Medicare Other | Admitting: Cardiovascular Disease

## 2016-09-07 VITALS — BP 138/64 | HR 111 | Ht 65.5 in | Wt 156.8 lb

## 2016-09-07 DIAGNOSIS — I4891 Unspecified atrial fibrillation: Secondary | ICD-10-CM

## 2016-09-07 DIAGNOSIS — I6523 Occlusion and stenosis of bilateral carotid arteries: Secondary | ICD-10-CM

## 2016-09-07 DIAGNOSIS — I10 Essential (primary) hypertension: Secondary | ICD-10-CM

## 2016-09-07 DIAGNOSIS — E7800 Pure hypercholesterolemia, unspecified: Secondary | ICD-10-CM

## 2016-09-07 DIAGNOSIS — R29898 Other symptoms and signs involving the musculoskeletal system: Secondary | ICD-10-CM

## 2016-09-07 MED ORDER — ALBUTEROL SULFATE HFA 108 (90 BASE) MCG/ACT IN AERS: 2.0000 | INHALATION_SPRAY | Freq: Four times a day (QID) | RESPIRATORY_TRACT | 3 refills | Status: AC | PRN

## 2016-09-07 NOTE — Patient Instructions (Addendum)
Medication Instructions:   No medication changes made  Please use albuterol as needed for shortness of breath  Labwork:  No new labs needed  Testing/Procedures:  No further testing at this time   Follow-Up: It was a pleasure seeing you in the office today. Please call us if you have new issues that need to be addressed before your next appt.  [phone]  Your physician wants you to follow-up in: 6 months.  You will receive a reminder letter in the mail two months in advance. If you don't receive a letter, please call our office to schedule the follow-up appointment.  If you need a refill on your cardiac medications before your next appointment, please call your pharmacy.

## 2016-09-07 NOTE — Progress Notes (Signed)
Cardiology Office Note  Date:  09/07/2016   ID:  [name] [name], DOB [birth year], MRN [id number]  PCP:  Einar Pheasant, MD   Chief Complaint  Patient presents with  . other    6 month f/u c/o sob. Meds reviewed verbally with pt.    HPI:  82 yo with history of chronic atrial fibrillation, HTN, 60-79% bilateral carotid stenosis, continued smoking presents for followup of her carotid disease and atrial fibrillation.    60-79% bilateral carotid arterial disease in 2015  In follow-up today she presents with her daughter, She reports that she had recent right leg weakness that presented acutely Had 3 falls in association with her leg weakness Etiology still unclear, has MRI brain scheduled, carotid ultrasound, echocardiogram on November 7 Took several days before feeling came back and her right leg  Has good pulse right LE Unclear symptoms were orthopedic and she was seen by Dr. Sabra Heck Cortisone shot given in her right hip It was felt symptoms could've been secondary to her lower back though no imaging performed  EKG on today's visit shows atrial fibrillation with ventricular rate 111 bpm, no significant ST or T-wave changes  Other past medical history reviewed Gall bladder out end of 2016, took some time to recover Status post laparoscopic cholecystectomy  Carotid ultrasound showing 60-79 percent bilateral disease in April 2015 Cholesterol is less than 150   Previous blood work last year showed elevated creatinine. This has improved on lower chlorthalidone  Echo in 7/11 showed preserved EF with mild mitral regurgitaiton and mild pulmonary hypertension (PA systolic pressure 42 mmHg).       PMH:   has a past medical history of Atrial fibrillation ([place]); Carotid stenosis; Cholelithiasis; Chronic kidney disease; Chronic low back pain; Gallstone pancreatitis (11-10-14); Gallstones; GERD (gastroesophageal reflux disease); Hyperlipidemia; Hypertension; Impaired fasting  glucose; and Osteoarthritis.  PSH:    Past Surgical History:  Procedure Laterality Date  . APPENDECTOMY    . CATARACT EXTRACTION     x2  . CHOLECYSTECTOMY  11-10-14  . CHOLECYSTECTOMY      Current Outpatient Prescriptions  Medication Sig Dispense Refill  . carvedilol (COREG) 25 MG tablet TAKE 1 TABLET (25 MG TOTAL) BY MOUTH 2 (TWO) TIMES DAILY WITH A MEAL. (Patient taking differently: TAKE 1/2 TABLET (12.5 MG TOTAL) BY MOUTH 2 (TWO) TIMES DAILY WITH A MEAL.) 180 tablet 1  . clonazePAM (KLONOPIN) 0.5 MG tablet TAKE ONE-HALF TO 1 TABLET BY MOUTH ONCE DAILY AS NEEDED 30 tablet 0  . hydrALAZINE (APRESOLINE) 10 MG tablet TAKE 1 TABLET BY MOUTH 3 TIMES A DAY 90 tablet 5  . losartan (COZAAR) 100 MG tablet TAKE 1 TABLET BY MOUTH DAILY. 90 tablet 3  . omeprazole (PRILOSEC) 20 MG capsule Take 20 mg by mouth daily.    . pravastatin (PRAVACHOL) 40 MG tablet TAKE 1 TABLET (40 MG TOTAL) BY MOUTH DAILY. 90 tablet 3  . warfarin (COUMADIN) 5 MG tablet TAKE AS DIRECTED BY ANTICOAGULATION CLINIC 30 tablet 3  . albuterol (PROVENTIL HFA;VENTOLIN HFA) 108 (90 Base) MCG/ACT inhaler Inhale 2 puffs into the lungs every 6 (six) hours as needed for wheezing or shortness of breath. 1 Inhaler 3   No current facility-administered medications for this visit.      Allergies:   Review of patient's allergies indicates no known allergies.   Social History:  The patient  reports that she quit smoking about 22 months ago. Her smoking use included Cigarettes. She smoked 0.50 packs per day. She has  never used smokeless tobacco. She reports that she drinks alcohol. She reports that she does not use drugs.   Family History:   family history includes Atrial fibrillation in her brother and sister; Heart attack (age of onset: 36) in her father; Heart failure (age of onset: 61) in her sister; Stroke in her sister.    Review of Systems: Review of Systems  Constitutional: Negative.   Respiratory: Negative.   Cardiovascular:  Negative.   Gastrointestinal: Negative.   Musculoskeletal: Negative.   Neurological: Negative.        Leg weakness  Psychiatric/Behavioral: Negative.   All other systems reviewed and are negative.    PHYSICAL EXAM: VS:  BP 138/64 (BP Location: Left Arm, Patient Position: Sitting, Cuff Size: Normal)   Pulse (!) 111   Ht 5' 5.5" (1.664 m)   Wt 156 lb 12 oz (71.1 kg)   BMI 25.69 kg/m  , BMI Body mass index is 25.69 kg/m. GEN: Well nourished, well developed, in no acute distress  HEENT: normal  Neck: no JVD, carotid bruits, or masses Cardiac: RRR; no murmurs, rubs, or gallops,no edema  Respiratory:  clear to auscultation bilaterally, normal work of breathing GI: soft, nontender, nondistended, + BS MS: no deformity or atrophy  Skin: warm and dry, no rash Neuro:  Strength and sensation are intact Psych: euthymic mood, full affect    Recent Labs: 09/06/2016: ALT 14; BUN 16; Creatinine, Ser 1.09; Hemoglobin 11.7; Platelets 135.0; Potassium 5.1; Sodium 137; TSH 2.44    Lipid Panel Lab Results  Component Value Date   CHOL 140 09/06/2016   HDL 69.20 09/06/2016   LDLCALC 56 09/06/2016   TRIG 75.0 09/06/2016      Wt Readings from Last 3 Encounters:  09/07/16 156 lb 12 oz (71.1 kg)  08/24/16 155 lb 6.4 oz (70.5 kg)  08/09/16 160 lb 4 oz (72.7 kg)       ASSESSMENT AND PLAN:  Atrial fibrillation, unspecified type ([place]) - Plan: EKG 12-Lead Rate improved in follow-up, initially elevated on EKG Tolerating warfarin  Pure hypercholesterolemia Cholesterol is at goal on the current lipid regimen. No changes to the medications were made.  Essential hypertension, benign Blood pressure is well controlled on today's visit. No changes made to the medications.  Bilateral carotid artery stenosis Carotid ultrasound scheduled November 7 Long discussion concerning her disease, possible TIA symptoms  Weakness of lower extremity, unspecified laterality Long discussion concerning  recent events Etiology of her leg weakness is unclear Unable to exclude TIA , possibly lumbar disc disease and nerve compression Testing scheduled November 7 including carotid    Total encounter time more than 25 minutes  Greater than 50% was spent in counseling and coordination of care with the patient   Disposition:   F/U  6 months   Orders Placed This Encounter  Procedures  . EKG 12-Lead     Signed, Esmond Plants, M.D., Ph.D. 09/07/2016  [place], [place]

## 2016-09-11 ENCOUNTER — Ambulatory Visit (INDEPENDENT_AMBULATORY_CARE_PROVIDER_SITE_OTHER): Payer: Medicare Other | Admitting: Internal Medicine

## 2016-09-11 ENCOUNTER — Encounter: Payer: Self-pay | Admitting: Internal Medicine

## 2016-09-11 VITALS — BP 164/80 | HR 70 | Temp 98.1°F | Ht 64.5 in | Wt 158.6 lb

## 2016-09-11 DIAGNOSIS — I10 Essential (primary) hypertension: Secondary | ICD-10-CM | POA: Diagnosis not present

## 2016-09-11 DIAGNOSIS — D649 Anemia, unspecified: Secondary | ICD-10-CM

## 2016-09-11 DIAGNOSIS — D696 Thrombocytopenia, unspecified: Secondary | ICD-10-CM | POA: Diagnosis not present

## 2016-09-11 DIAGNOSIS — E7800 Pure hypercholesterolemia, unspecified: Secondary | ICD-10-CM

## 2016-09-11 DIAGNOSIS — R945 Abnormal results of liver function studies: Secondary | ICD-10-CM

## 2016-09-11 DIAGNOSIS — Z0000 Encounter for general adult medical examination without abnormal findings: Secondary | ICD-10-CM | POA: Diagnosis not present

## 2016-09-11 DIAGNOSIS — I6523 Occlusion and stenosis of bilateral carotid arteries: Secondary | ICD-10-CM

## 2016-09-11 DIAGNOSIS — R7989 Other specified abnormal findings of blood chemistry: Secondary | ICD-10-CM

## 2016-09-11 DIAGNOSIS — I4891 Unspecified atrial fibrillation: Secondary | ICD-10-CM

## 2016-09-11 DIAGNOSIS — R29898 Other symptoms and signs involving the musculoskeletal system: Secondary | ICD-10-CM

## 2016-09-11 NOTE — Progress Notes (Signed)
Patient ID: [name] [name], female   DOB: 09-16-1928, 82 y.o.   MRN: [id number]   Subjective:    Patient ID: [name] [name], female    DOB: 1928/10/11, 82 y.o.   MRN: [id number]  HPI  Patient here for a scheduled follow up.  Has chronic afib and bilateral carotid stenosis.  Sees Dr Rockey Situ.  Was just evaluated 09/07/16.  Note reviewed.  Is scheduled for carotid ultrasound 09/19/16.  Also scheduled for echo..  She is doing better.  Feels better.  Saw ortho.  Diagnosed with trochanteric bursitis.  Recommended physical therapy.  Has not started physical therapy.  No chest pain.  Breathing stable.  Eating.  No nausea or vomiting.  Bowels stable.  Taking her medications regularly.     Past Medical History:  Diagnosis Date  . Atrial fibrillation ([place])   . Carotid stenosis   . Cholelithiasis   . Chronic kidney disease   . Chronic low back pain   . Gallstone pancreatitis 11-10-14  . Gallstones   . GERD (gastroesophageal reflux disease)   . Hyperlipidemia   . Hypertension   . Impaired fasting glucose   . Osteoarthritis    Past Surgical History:  Procedure Laterality Date  . APPENDECTOMY    . CATARACT EXTRACTION     x2  . CHOLECYSTECTOMY  11-10-14  . CHOLECYSTECTOMY     Family History  Problem Relation Age of Onset  . Heart attack Father 95  . Atrial fibrillation Sister   . Atrial fibrillation Brother   . Heart failure Sister 59  . Stroke Sister    Social History   Social History  . Marital status: Widowed    Spouse name: N/A  . Number of children: N/A  . Years of education: N/A   Social History Main Topics  . Smoking status: Former Smoker    Packs/day: 0.50    Types: Cigarettes    Quit date: 11/04/2014  . Smokeless tobacco: Never Used     Comment: pt stopped two weeks ago  . Alcohol use 0.0 oz/week     Comment: occasional  . Drug use: No  . Sexual activity: Not Asked   Other Topics Concern  . None   Social History Narrative  . None    Outpatient  Encounter Prescriptions as of 09/11/2016  Medication Sig  . albuterol (PROVENTIL HFA;VENTOLIN HFA) 108 (90 Base) MCG/ACT inhaler Inhale 2 puffs into the lungs every 6 (six) hours as needed for wheezing or shortness of breath.  . carvedilol (COREG) 25 MG tablet TAKE 1 TABLET (25 MG TOTAL) BY MOUTH 2 (TWO) TIMES DAILY WITH A MEAL. (Patient taking differently: TAKE 1/2 TABLET (12.5 MG TOTAL) BY MOUTH 2 (TWO) TIMES DAILY WITH A MEAL.)  . clonazePAM (KLONOPIN) 0.5 MG tablet TAKE ONE-HALF TO 1 TABLET BY MOUTH ONCE DAILY AS NEEDED  . hydrALAZINE (APRESOLINE) 10 MG tablet TAKE 1 TABLET BY MOUTH 3 TIMES A DAY  . losartan (COZAAR) 100 MG tablet TAKE 1 TABLET BY MOUTH DAILY.  Marland Kitchen omeprazole (PRILOSEC) 20 MG capsule Take 20 mg by mouth daily.  . pravastatin (PRAVACHOL) 40 MG tablet TAKE 1 TABLET (40 MG TOTAL) BY MOUTH DAILY.  Marland Kitchen warfarin (COUMADIN) 5 MG tablet TAKE AS DIRECTED BY ANTICOAGULATION CLINIC   No facility-administered encounter medications on file as of 09/11/2016.     Review of Systems  Constitutional: Negative for appetite change and unexpected weight change.  HENT: Negative for congestion and sinus pressure.   Eyes: Negative for pain  and visual disturbance.  Respiratory: Negative for cough, chest tightness and shortness of breath.   Cardiovascular: Negative for chest pain, palpitations and leg swelling.  Gastrointestinal: Negative for abdominal pain, diarrhea, nausea and vomiting.  Genitourinary: Negative for difficulty urinating and dysuria.  Musculoskeletal: Negative for back pain and joint swelling.  Skin: Negative for color change and rash.  Neurological: Negative for dizziness, light-headedness and headaches.  Hematological: Negative for adenopathy. Does not bruise/bleed easily.  Psychiatric/Behavioral: Negative for agitation and dysphoric mood.       Objective:     Blood pressure rechecked by me:  146/78  Physical Exam  Constitutional: She is oriented to person, place, and  time. She appears well-developed and well-nourished. No distress.  HENT:  Nose: Nose normal.  Mouth/Throat: Oropharynx is clear and moist.  Eyes: Right eye exhibits no discharge. Left eye exhibits no discharge. No scleral icterus.  Neck: Neck supple. No thyromegaly present.  Cardiovascular: Normal rate and regular rhythm.   Pulmonary/Chest: Breath sounds normal. No accessory muscle usage. No tachypnea. No respiratory distress. She has no decreased breath sounds. She has no wheezes. She has no rhonchi. Right breast exhibits no inverted nipple, no mass, no nipple discharge and no tenderness (no axillary adenopathy). Left breast exhibits no inverted nipple, no mass, no nipple discharge and no tenderness (no axilarry adenopathy).  Abdominal: Soft. Bowel sounds are normal. There is no tenderness.  Musculoskeletal: She exhibits no edema or tenderness.  Lymphadenopathy:    She has no cervical adenopathy.  Neurological: She is alert and oriented to person, place, and time.  Skin: Skin is warm. No rash noted. No erythema.  Psychiatric: She has a normal mood and affect. Her behavior is normal.    BP (!) 164/80   Pulse 70   Temp 98.1 F (36.7 C) (Oral)   Ht 5' 4.5" (1.638 m)   Wt 158 lb 9.6 oz (71.9 kg)   SpO2 95%   BMI 26.80 kg/m  Wt Readings from Last 3 Encounters:  09/11/16 158 lb 9.6 oz (71.9 kg)  09/07/16 156 lb 12 oz (71.1 kg)  08/24/16 155 lb 6.4 oz (70.5 kg)     Lab Results  Component Value Date   WBC 6.5 09/06/2016   HGB 11.7 (L) 09/06/2016   HCT 33.6 (L) 09/06/2016   PLT 135.0 (L) 09/06/2016   GLUCOSE 143 (H) 09/06/2016   CHOL 140 09/06/2016   TRIG 75.0 09/06/2016   HDL 69.20 09/06/2016   LDLCALC 56 09/06/2016   ALT 14 09/06/2016   AST 15 09/06/2016   NA 137 09/06/2016   K 5.1 09/06/2016   CL 102 09/06/2016   CREATININE 1.09 09/06/2016   BUN 16 09/06/2016   CO2 28 09/06/2016   TSH 2.44 09/06/2016   INR 4.8 09/13/2016   HGBA1C 5.8 09/06/2016    US Venous Img  Lower Unilateral Left  Result Date: 08/24/2016 CLINICAL DATA:  Left leg swelling and calf pain. EXAM: LEFT LOWER EXTREMITY VENOUS DOPPLER ULTRASOUND TECHNIQUE: Gray-scale sonography with graded compression, as well as color Doppler and duplex ultrasound, were performed to evaluate the deep venous system from the level of the common femoral vein through the popliteal and proximal calf veins. Spectral Doppler was utilized to evaluate flow at rest and with distal augmentation maneuvers. COMPARISON:  None. FINDINGS: Right common femoral vein is patent without thrombus. Normal compressibility, augmentation and color Doppler flow in the left common femoral vein, left femoral vein and left popliteal vein. The left saphenofemoral junction is patent.  Left profunda femoral vein is patent without thrombus. Visualized left deep calf veins are patent without thrombus. IMPRESSION: Negative for deep venous thrombosis in left lower extremity. Electronically Signed   By: Markus Daft M.D.   On: 08/24/2016 14:44       Assessment & Plan:   Problem List Items Addressed This Visit    Abnormal liver function tests    Follow liver function tests.        Anemia    Has declined GI evaluation.  Follow cbc.       Atrial fibrillation ([place])    On coumadin.  Followed by cardiology.  Rate controlled.        Carotid stenosis    Has been followed by Dr Rockey Situ.  Scheduled for f/u carotid ultrasound.  On coumadin.       Essential hypertension    Blood pressure is doing better.   Continue same medication regimen.  Follow pressures.  Follow metabolic panel.        Health care maintenance    Physical today 09/11/16.  Declines mammogram and any further GI testing.        Hyperlipidemia    Low cholesterol diet and exercise.  Follow lipid panel and liver function tests.  On pravastatin.        Leg weakness    Has improved.  Scheduled for MRI, carotid ultrasound and echo.  Follow.        Thrombocytopenia ([facility])     Last platelet count 135.  Stable.  Follow.  She has declined further w/up.            Einar Pheasant, MD

## 2016-09-11 NOTE — Progress Notes (Signed)
Pre visit review using our clinic review tool, if applicable. No additional management support is needed unless otherwise documented below in the visit note. 

## 2016-09-13 ENCOUNTER — Ambulatory Visit (INDEPENDENT_AMBULATORY_CARE_PROVIDER_SITE_OTHER): Payer: Medicare Other

## 2016-09-13 DIAGNOSIS — I4891 Unspecified atrial fibrillation: Secondary | ICD-10-CM

## 2016-09-13 DIAGNOSIS — Z7901 Long term (current) use of anticoagulants: Secondary | ICD-10-CM | POA: Diagnosis not present

## 2016-09-13 DIAGNOSIS — Z5181 Encounter for therapeutic drug level monitoring: Secondary | ICD-10-CM

## 2016-09-13 LAB — POCT INR: INR: 4.8

## 2016-09-17 ENCOUNTER — Encounter: Payer: Self-pay | Admitting: Internal Medicine

## 2016-09-17 DIAGNOSIS — Z7189 Other specified counseling: Secondary | ICD-10-CM | POA: Insufficient documentation

## 2016-09-17 NOTE — Assessment & Plan Note (Signed)
Has been followed by Dr Rockey Situ.  Scheduled for f/u carotid ultrasound.  On coumadin.

## 2016-09-17 NOTE — Assessment & Plan Note (Addendum)
Blood pressure is doing better.   Continue same medication regimen.  Follow pressures.  Follow metabolic panel.

## 2016-09-17 NOTE — Assessment & Plan Note (Signed)
Last platelet count 135.  Stable.  Follow.  She has declined further w/up.

## 2016-09-17 NOTE — Assessment & Plan Note (Signed)
Physical today 09/11/16.  Declines mammogram and any further GI testing.

## 2016-09-17 NOTE — Assessment & Plan Note (Signed)
Has improved.  Scheduled for MRI, carotid ultrasound and echo.  Follow.

## 2016-09-17 NOTE — Assessment & Plan Note (Signed)
Low cholesterol diet and exercise.  Follow lipid panel and liver function tests.  On pravastatin.   

## 2016-09-17 NOTE — Assessment & Plan Note (Signed)
Has declined GI evaluation.  Follow cbc.

## 2016-09-17 NOTE — Assessment & Plan Note (Signed)
On coumadin.  Followed by cardiology.  Rate controlled.

## 2016-09-17 NOTE — Assessment & Plan Note (Signed)
Follow liver function tests.

## 2016-09-19 ENCOUNTER — Other Ambulatory Visit: Payer: Self-pay

## 2016-09-19 ENCOUNTER — Ambulatory Visit (INDEPENDENT_AMBULATORY_CARE_PROVIDER_SITE_OTHER): Payer: Medicare Other

## 2016-09-19 ENCOUNTER — Ambulatory Visit: Admission: RE | Admit: 2016-09-19 | Payer: Medicare Other | Source: Ambulatory Visit

## 2016-09-19 ENCOUNTER — Ambulatory Visit: Payer: Medicare Other

## 2016-09-19 DIAGNOSIS — I4891 Unspecified atrial fibrillation: Secondary | ICD-10-CM

## 2016-09-19 DIAGNOSIS — R29898 Other symptoms and signs involving the musculoskeletal system: Secondary | ICD-10-CM

## 2016-09-19 DIAGNOSIS — I6523 Occlusion and stenosis of bilateral carotid arteries: Secondary | ICD-10-CM | POA: Diagnosis not present

## 2016-09-27 ENCOUNTER — Ambulatory Visit (INDEPENDENT_AMBULATORY_CARE_PROVIDER_SITE_OTHER): Payer: Medicare Other

## 2016-09-27 DIAGNOSIS — Z7901 Long term (current) use of anticoagulants: Secondary | ICD-10-CM | POA: Diagnosis not present

## 2016-09-27 DIAGNOSIS — Z5181 Encounter for therapeutic drug level monitoring: Secondary | ICD-10-CM

## 2016-09-27 DIAGNOSIS — I4891 Unspecified atrial fibrillation: Secondary | ICD-10-CM | POA: Diagnosis not present

## 2016-09-27 LAB — POCT INR: INR: 2.5

## 2016-10-09 ENCOUNTER — Other Ambulatory Visit: Payer: Self-pay | Admitting: Cardiovascular Disease

## 2016-10-09 NOTE — Telephone Encounter (Signed)
Please review for refill. Thanks!  

## 2016-10-17 ENCOUNTER — Ambulatory Visit: Payer: Medicare Other

## 2016-10-18 ENCOUNTER — Ambulatory Visit (INDEPENDENT_AMBULATORY_CARE_PROVIDER_SITE_OTHER): Payer: Medicare Other

## 2016-10-18 DIAGNOSIS — Z5181 Encounter for therapeutic drug level monitoring: Secondary | ICD-10-CM

## 2016-10-18 DIAGNOSIS — I4891 Unspecified atrial fibrillation: Secondary | ICD-10-CM

## 2016-10-18 DIAGNOSIS — Z7901 Long term (current) use of anticoagulants: Secondary | ICD-10-CM | POA: Diagnosis not present

## 2016-10-18 LAB — POCT INR: INR: 2.5

## 2016-11-14 ENCOUNTER — Other Ambulatory Visit: Payer: Self-pay | Admitting: Internal Medicine

## 2016-11-15 ENCOUNTER — Ambulatory Visit (INDEPENDENT_AMBULATORY_CARE_PROVIDER_SITE_OTHER): Payer: Medicare Other

## 2016-11-15 DIAGNOSIS — I4891 Unspecified atrial fibrillation: Secondary | ICD-10-CM | POA: Diagnosis not present

## 2016-11-15 DIAGNOSIS — Z7901 Long term (current) use of anticoagulants: Secondary | ICD-10-CM | POA: Diagnosis not present

## 2016-11-15 DIAGNOSIS — Z5181 Encounter for therapeutic drug level monitoring: Secondary | ICD-10-CM

## 2016-11-15 LAB — POCT INR: INR: 3.6

## 2016-11-17 ENCOUNTER — Encounter: Payer: Self-pay | Admitting: Internal Medicine

## 2016-11-17 ENCOUNTER — Ambulatory Visit (INDEPENDENT_AMBULATORY_CARE_PROVIDER_SITE_OTHER): Payer: Medicare Other

## 2016-11-17 ENCOUNTER — Ambulatory Visit (INDEPENDENT_AMBULATORY_CARE_PROVIDER_SITE_OTHER): Payer: Medicare Other | Admitting: Internal Medicine

## 2016-11-17 VITALS — BP 154/86 | HR 85 | Temp 98.2°F | Ht 65.0 in | Wt 155.6 lb

## 2016-11-17 DIAGNOSIS — D696 Thrombocytopenia, unspecified: Secondary | ICD-10-CM

## 2016-11-17 DIAGNOSIS — R7989 Other specified abnormal findings of blood chemistry: Secondary | ICD-10-CM

## 2016-11-17 DIAGNOSIS — R059 Cough, unspecified: Secondary | ICD-10-CM

## 2016-11-17 DIAGNOSIS — R05 Cough: Secondary | ICD-10-CM

## 2016-11-17 DIAGNOSIS — E7800 Pure hypercholesterolemia, unspecified: Secondary | ICD-10-CM | POA: Diagnosis not present

## 2016-11-17 DIAGNOSIS — R739 Hyperglycemia, unspecified: Secondary | ICD-10-CM

## 2016-11-17 DIAGNOSIS — R062 Wheezing: Secondary | ICD-10-CM

## 2016-11-17 DIAGNOSIS — I10 Essential (primary) hypertension: Secondary | ICD-10-CM | POA: Diagnosis not present

## 2016-11-17 DIAGNOSIS — R221 Localized swelling, mass and lump, neck: Secondary | ICD-10-CM

## 2016-11-17 DIAGNOSIS — R945 Abnormal results of liver function studies: Secondary | ICD-10-CM

## 2016-11-17 DIAGNOSIS — D649 Anemia, unspecified: Secondary | ICD-10-CM

## 2016-11-17 DIAGNOSIS — I6523 Occlusion and stenosis of bilateral carotid arteries: Secondary | ICD-10-CM

## 2016-11-17 DIAGNOSIS — I4891 Unspecified atrial fibrillation: Secondary | ICD-10-CM

## 2016-11-17 NOTE — Progress Notes (Signed)
Pre visit review using our clinic review tool, if applicable. No additional management support is needed unless otherwise documented below in the visit note. 

## 2016-11-17 NOTE — Progress Notes (Signed)
Patient ID: [name] [name], female   DOB: Mar 17, 1928, 83 y.o.   MRN: [id number]   Subjective:    Patient ID: [name] [name], female    DOB: 05/16/1928, 83 y.o.   MRN: [id number]  HPI  Patient here for a scheduled follow up.  She reports that she is doing relatively well.  Does not get out as much as she would like.  Tries to stay active.  Has noticed some intermittent wheezing and occasional cough.  Has been persistent.  She feels is some better.  Does not smoke now.  No chest pain.  Breathing otherwise overall stable.  Followed by Dr Rockey Situ.  Had recent echo.  ECHO report reviewed.  Question of left subclavian artery stenosis.  Stable carotid ultrasound.  She did not have MRI and desires not to pursue MRI testing at this time.  Discussed with her today.  She has had no more "spells".  Saw ortho.  Had bursitis.  Injection.  Legs doing better.  Eating.  No nausea or vomiting.  Bowels stable.     Past Medical History:  Diagnosis Date  . Atrial fibrillation ([place])   . Carotid stenosis   . Cholelithiasis   . Chronic kidney disease   . Chronic low back pain   . Gallstone pancreatitis 11-10-14  . Gallstones   . GERD (gastroesophageal reflux disease)   . Hyperlipidemia   . Hypertension   . Impaired fasting glucose   . Osteoarthritis    Past Surgical History:  Procedure Laterality Date  . APPENDECTOMY    . CATARACT EXTRACTION     x2  . CHOLECYSTECTOMY  11-10-14  . CHOLECYSTECTOMY     Family History  Problem Relation Age of Onset  . Heart attack Father 1  . Atrial fibrillation Sister   . Atrial fibrillation Brother   . Heart failure Sister 52  . Stroke Sister    Social History   Social History  . Marital status: Widowed    Spouse name: N/A  . Number of children: N/A  . Years of education: N/A   Social History Main Topics  . Smoking status: Former Smoker    Packs/day: 0.50    Types: Cigarettes    Quit date: 11/04/2014  . Smokeless tobacco: Never Used   Comment: pt stopped two weeks ago  . Alcohol use 0.0 oz/week     Comment: occasional  . Drug use: No  . Sexual activity: Not Asked   Other Topics Concern  . None   Social History Narrative  . None    Outpatient Encounter Prescriptions as of 11/17/2016  Medication Sig  . albuterol (PROVENTIL HFA;VENTOLIN HFA) 108 (90 Base) MCG/ACT inhaler Inhale 2 puffs into the lungs every 6 (six) hours as needed for wheezing or shortness of breath.  . carvedilol (COREG) 25 MG tablet TAKE 1 TABLET (25 MG TOTAL) BY MOUTH 2 (TWO) TIMES DAILY WITH A MEAL. (Patient taking differently: TAKE 1/2 TABLET (12.5 MG TOTAL) BY MOUTH 2 (TWO) TIMES DAILY WITH A MEAL.)  . clonazePAM (KLONOPIN) 0.5 MG tablet TAKE 1/2 TO 1 TABLET BY MOUTH ONCE DAILY AS NEEDED  . hydrALAZINE (APRESOLINE) 10 MG tablet TAKE 1 TABLET BY MOUTH 3 TIMES A DAY  . losartan (COZAAR) 100 MG tablet TAKE 1 TABLET BY MOUTH DAILY.  Marland Kitchen omeprazole (PRILOSEC) 20 MG capsule Take 20 mg by mouth daily.  . pravastatin (PRAVACHOL) 40 MG tablet TAKE 1 TABLET (40 MG TOTAL) BY MOUTH DAILY.  Marland Kitchen warfarin (COUMADIN) 5 MG  tablet TAKE AS DIRECTED BY ANTICOAGULATION CLINIC   No facility-administered encounter medications on file as of 11/17/2016.     Review of Systems  Constitutional: Negative for appetite change and unexpected weight change.  HENT: Negative for congestion and sinus pressure.   Respiratory: Positive for cough and wheezing. Negative for chest tightness and shortness of breath.   Cardiovascular: Negative for chest pain, palpitations and leg swelling.  Gastrointestinal: Negative for abdominal pain, diarrhea, nausea and vomiting.  Genitourinary: Negative for difficulty urinating and dysuria.  Musculoskeletal: Negative for joint swelling and myalgias.  Skin: Negative for color change and rash.  Neurological: Negative for dizziness, light-headedness and headaches.  Psychiatric/Behavioral: Negative for agitation and dysphoric mood.       Objective:      Physical Exam  Constitutional: She appears well-developed and well-nourished. No distress.  HENT:  Nose: Nose normal.  Mouth/Throat: Oropharynx is clear and moist.  Neck: Neck supple. No thyromegaly present.  Left neck nodule.  Non tender.   Cardiovascular: Normal rate and regular rhythm.   Pulse equal and blood pressure relatively equal bilateral upper extremities.    Pulmonary/Chest: Breath sounds normal. No respiratory distress.  Some minimal faint wheezing noticed with forced expiration.    Abdominal: Soft. Bowel sounds are normal. There is no tenderness.  Musculoskeletal: She exhibits no edema or tenderness.  Skin: No rash noted. No erythema.  Psychiatric: She has a normal mood and affect. Her behavior is normal.    BP (!) 154/86   Pulse 85   Temp 98.2 F (36.8 C) (Oral)   Ht '5\' 5"'$  (1.651 m)   Wt 155 lb 9.6 oz (70.6 kg)   SpO2 94%   BMI 25.89 kg/m  Wt Readings from Last 3 Encounters:  11/17/16 155 lb 9.6 oz (70.6 kg)  09/11/16 158 lb 9.6 oz (71.9 kg)  09/07/16 156 lb 12 oz (71.1 kg)     Lab Results  Component Value Date   WBC 6.5 09/06/2016   HGB 11.7 (L) 09/06/2016   HCT 33.6 (L) 09/06/2016   PLT 135.0 (L) 09/06/2016   GLUCOSE 143 (H) 09/06/2016   CHOL 140 09/06/2016   TRIG 75.0 09/06/2016   HDL 69.20 09/06/2016   LDLCALC 56 09/06/2016   ALT 14 09/06/2016   AST 15 09/06/2016   NA 137 09/06/2016   K 5.1 09/06/2016   CL 102 09/06/2016   CREATININE 1.09 09/06/2016   BUN 16 09/06/2016   CO2 28 09/06/2016   TSH 2.44 09/06/2016   INR 3.6 11/15/2016   HGBA1C 5.8 09/06/2016    US Venous Img Lower Unilateral Left  Result Date: 08/24/2016 CLINICAL DATA:  Left leg swelling and calf pain. EXAM: LEFT LOWER EXTREMITY VENOUS DOPPLER ULTRASOUND TECHNIQUE: Gray-scale sonography with graded compression, as well as color Doppler and duplex ultrasound, were performed to evaluate the deep venous system from the level of the common femoral vein through the popliteal and  proximal calf veins. Spectral Doppler was utilized to evaluate flow at rest and with distal augmentation maneuvers. COMPARISON:  None. FINDINGS: Right common femoral vein is patent without thrombus. Normal compressibility, augmentation and color Doppler flow in the left common femoral vein, left femoral vein and left popliteal vein. The left saphenofemoral junction is patent. Left profunda femoral vein is patent without thrombus. Visualized left deep calf veins are patent without thrombus. IMPRESSION: Negative for deep venous thrombosis in left lower extremity. Electronically Signed   By: Markus Daft M.D.   On: 08/24/2016 14:44  Assessment & Plan:   Problem List Items Addressed This Visit    Abnormal liver function tests    Last liver check wnl.  Follow liver function tests.        Anemia    hgb on last check 11.7.  Follow.        Relevant Orders   CBC with Differential/Platelet   Ferritin   Atrial fibrillation ([facility])    On coumadin.  Followed by cardiology.  Rate controlled.        Carotid stenosis    Just had carotid ultrasound 09/19/16 - stable.  See overview for report.  Recommended f/u in one year.  Continue risk factor modification.        Cough    Persistent intermittent cough and some wheezing.  She feels is better.  Will check cxr.  Robitussin.  Has albuterol inhaler.        Essential hypertension    Blood pressure overall doing better.  Still slightly elevated in the office, but improved.  Continue same medication regimen.  Follow.        Relevant Orders   Basic metabolic panel   Hyperlipidemia    On pravastatin.  Low cholesterol diet and exercise.  Follow lipid panel and liver function tests.        Relevant Orders   Hepatic function panel   Lipid panel   Neck nodule    Left neck nodule as outlined.  No pain.  Discussed my desire for further w/up and evaluation.  She declines.  Wants to follow.  Will notify me if she changes her mind.  Also discussed echo  report and question of  Subclavian artery stenosis.  Equal pulse and pressure bilaterally.  She declines further scanning or testing.        Thrombocytopenia ([facility])    Platelet count stable - 135 on last check.  Follow cbc.        Other Visit Diagnoses    Wheezing    -  Primary   Relevant Orders   DG Chest 2 View (Completed)   Hyperglycemia       Relevant Orders   Hemoglobin A1c       Einar Pheasant, MD

## 2016-11-19 ENCOUNTER — Encounter: Payer: Self-pay | Admitting: Internal Medicine

## 2016-11-19 DIAGNOSIS — R059 Cough, unspecified: Secondary | ICD-10-CM | POA: Insufficient documentation

## 2016-11-19 DIAGNOSIS — R05 Cough: Secondary | ICD-10-CM | POA: Insufficient documentation

## 2016-11-19 DIAGNOSIS — R221 Localized swelling, mass and lump, neck: Secondary | ICD-10-CM | POA: Insufficient documentation

## 2016-11-19 NOTE — Assessment & Plan Note (Signed)
Last liver check wnl.  Follow liver function tests.

## 2016-11-19 NOTE — Assessment & Plan Note (Signed)
Just had carotid ultrasound 09/19/16 - stable.  See overview for report.  Recommended f/u in one year.  Continue risk factor modification.

## 2016-11-19 NOTE — Assessment & Plan Note (Signed)
hgb on last check 11.7.  Follow.

## 2016-11-19 NOTE — Assessment & Plan Note (Signed)
On pravastatin.  Low cholesterol diet and exercise.  Follow lipid panel and liver function tests.   

## 2016-11-19 NOTE — Assessment & Plan Note (Signed)
Platelet count stable - 135 on last check.  Follow cbc.

## 2016-11-19 NOTE — Assessment & Plan Note (Signed)
Persistent intermittent cough and some wheezing.  She feels is better.  Will check cxr.  Robitussin.  Has albuterol inhaler.

## 2016-11-19 NOTE — Assessment & Plan Note (Signed)
On coumadin.  Followed by cardiology.  Rate controlled.

## 2016-11-19 NOTE — Assessment & Plan Note (Signed)
Left neck nodule as outlined.  No pain.  Discussed my desire for further w/up and evaluation.  She declines.  Wants to follow.  Will notify me if she changes her mind.  Also discussed echo report and question of  Subclavian artery stenosis.  Equal pulse and pressure bilaterally.  She declines further scanning or testing.

## 2016-11-19 NOTE — Assessment & Plan Note (Signed)
Blood pressure overall doing better.  Still slightly elevated in the office, but improved.  Continue same medication regimen.  Follow.

## 2016-11-20 ENCOUNTER — Other Ambulatory Visit: Payer: Self-pay | Admitting: Internal Medicine

## 2016-11-20 ENCOUNTER — Telehealth: Payer: Self-pay | Admitting: *Deleted

## 2016-11-20 MED ORDER — AZITHROMYCIN 250 MG PO TABS: ORAL_TABLET | ORAL | 0 refills | Status: DC

## 2016-11-20 NOTE — Telephone Encounter (Signed)
Spoke with patients daughter. Please see results

## 2016-11-20 NOTE — Progress Notes (Signed)
rx sent in for zpak.   

## 2016-11-20 NOTE — Telephone Encounter (Signed)
Patient daughter stated that she was advised to call the office by Dr. Nicki Reaper on 01/07 Please contact [name] [name] [phone]

## 2016-11-28 ENCOUNTER — Telehealth: Payer: Self-pay | Admitting: Internal Medicine

## 2016-11-28 NOTE — Telephone Encounter (Signed)
Pt daughter called and stated that pt is c/o wheezing, lethargic, loss of appetite, weakness, fatigue. Please advise, thank you!  Call daughter Hinton Dyer @ [phone]

## 2016-11-28 NOTE — Telephone Encounter (Signed)
Reason for call:wheezing, weakness , lethargic, shortness of breath  Symptoms: "feels like her throat is full" , tightness in her chest", wheezing, "feels like there is frog in her throat" coughing, describes , short of breath when  changing the bed ,  weakness. Energy level worse, no appetite, " Patient states she thinks it maybe nerves"  Very fatigued, "patient states moving around like an old person. Patient is very mentally alert of surrounding date, time, watching T.V.   Stll intake fluids.   Duration symptoms worsening Medications:won't use inhaler Albuterol  Last seen for this problem: She has taken Zithromax 3 days ago  Didn't have MRI should this be re-scheduled since she had fall, haven't seen cardiologist yet since the fall ? appt  Antionette Fairy daughter  [phone]

## 2016-11-28 NOTE — Telephone Encounter (Signed)
Left message to call for [name] [name] daughter

## 2016-11-28 NOTE — Telephone Encounter (Signed)
With sob and weakness, she needs to be seen today.   Recommend acute care (or ER) pending symptoms.  Was recently treated for possible pneumonia/minimal fluid.  Recommend evaluation and then can determine best treatment.  We will f/u after.

## 2016-11-28 NOTE — Telephone Encounter (Signed)
Spoke with patients daughter Hinton Dyer advised of below (who is out of town)  She stated patient is  "feeling better she went to grocery store" thinks she may have just over done it this weekend she feels good today."   I urged her again to take patient to have her evaluated at acute care or ER for work up per your request.

## 2016-12-04 ENCOUNTER — Telehealth: Payer: Self-pay | Admitting: *Deleted

## 2016-12-04 NOTE — Telephone Encounter (Signed)
Patient talked with Cyril Mourning.

## 2016-12-04 NOTE — Telephone Encounter (Signed)
Daughter stated she is going to take her to the ER, if she can [persuade patient. FYI

## 2016-12-04 NOTE — Telephone Encounter (Signed)
I would rather see her during lunch tomorrow instead of end of day, in case she needs testing, etc.  If she is this sob and having these problems, she really needs to go to ER tonight to confirm nothing more acute going on and then can f/u with [place] after.

## 2016-12-04 NOTE — Telephone Encounter (Signed)
Pt currently has SOB, fatigue and loss of appetite. Pt's daughter has requested to have a Xray done in this office.  Contact Hinton Dyer  [phone] Pt transferred to [facility]

## 2016-12-04 NOTE — Telephone Encounter (Signed)
Reason for call:wheezing, shortness  of breath, Symptoms:Daughter states  patient  "feels like she had mini stroke", weakness in hands both , went to store today, all she could do get from store to car due to shortness of breath, not retaining fluid, is drinking water, thinking clear  Per daughter states face isn't drawn, not lethargic, Daughter was with her yesterday states shortness of breath wasn't that bad.   Duration 11/17/16 Medications: Last seen for this problem:11/17/16 You have opening for 4:00pm tomorrow , patient insist to seeing you.  Patient refuses going to ER or urgent care for work up.  Please advise.   Hinton Dyer daughter [phone]

## 2016-12-05 ENCOUNTER — Emergency Department: Payer: Medicare Other

## 2016-12-05 ENCOUNTER — Inpatient Hospital Stay
Admission: EM | Admit: 2016-12-05 | Discharge: 2016-12-09 | DRG: 181 | Disposition: A | Discharge status: Home / Self Care | Payer: Medicare Other | Attending: Internal Medicine | Admitting: Internal Medicine

## 2016-12-05 ENCOUNTER — Telehealth: Payer: Self-pay | Admitting: *Deleted

## 2016-12-05 ENCOUNTER — Encounter: Payer: Self-pay | Admitting: Internal Medicine

## 2016-12-05 ENCOUNTER — Encounter: Payer: Self-pay | Admitting: Emergency Medicine

## 2016-12-05 DIAGNOSIS — R791 Abnormal coagulation profile: Secondary | ICD-10-CM | POA: Diagnosis present

## 2016-12-05 DIAGNOSIS — C7800 Secondary malignant neoplasm of unspecified lung: Secondary | ICD-10-CM | POA: Diagnosis present

## 2016-12-05 DIAGNOSIS — C787 Secondary malignant neoplasm of liver and intrahepatic bile duct: Secondary | ICD-10-CM | POA: Diagnosis present

## 2016-12-05 DIAGNOSIS — T45515A Adverse effect of anticoagulants, initial encounter: Secondary | ICD-10-CM | POA: Diagnosis present

## 2016-12-05 DIAGNOSIS — I482 Chronic atrial fibrillation: Secondary | ICD-10-CM | POA: Diagnosis present

## 2016-12-05 DIAGNOSIS — Z515 Encounter for palliative care: Secondary | ICD-10-CM | POA: Diagnosis present

## 2016-12-05 DIAGNOSIS — K219 Gastro-esophageal reflux disease without esophagitis: Secondary | ICD-10-CM | POA: Diagnosis present

## 2016-12-05 DIAGNOSIS — J90 Pleural effusion, not elsewhere classified: Secondary | ICD-10-CM | POA: Diagnosis not present

## 2016-12-05 DIAGNOSIS — I129 Hypertensive chronic kidney disease with stage 1 through stage 4 chronic kidney disease, or unspecified chronic kidney disease: Secondary | ICD-10-CM | POA: Diagnosis present

## 2016-12-05 DIAGNOSIS — Y92230 Patient room in hospital as the place of occurrence of the external cause: Secondary | ICD-10-CM | POA: Diagnosis not present

## 2016-12-05 DIAGNOSIS — C7951 Secondary malignant neoplasm of bone: Secondary | ICD-10-CM | POA: Diagnosis present

## 2016-12-05 DIAGNOSIS — R918 Other nonspecific abnormal finding of lung field: Secondary | ICD-10-CM

## 2016-12-05 DIAGNOSIS — Z7901 Long term (current) use of anticoagulants: Secondary | ICD-10-CM | POA: Diagnosis not present

## 2016-12-05 DIAGNOSIS — I871 Compression of vein: Secondary | ICD-10-CM

## 2016-12-05 DIAGNOSIS — N183 Chronic kidney disease, stage 3 (moderate): Secondary | ICD-10-CM | POA: Diagnosis present

## 2016-12-05 DIAGNOSIS — Z87891 Personal history of nicotine dependence: Secondary | ICD-10-CM | POA: Diagnosis not present

## 2016-12-05 DIAGNOSIS — M899 Disorder of bone, unspecified: Secondary | ICD-10-CM

## 2016-12-05 DIAGNOSIS — E785 Hyperlipidemia, unspecified: Secondary | ICD-10-CM | POA: Diagnosis present

## 2016-12-05 DIAGNOSIS — I959 Hypotension, unspecified: Secondary | ICD-10-CM | POA: Diagnosis not present

## 2016-12-05 DIAGNOSIS — Z9049 Acquired absence of other specified parts of digestive tract: Secondary | ICD-10-CM | POA: Diagnosis not present

## 2016-12-05 DIAGNOSIS — N289 Disorder of kidney and ureter, unspecified: Secondary | ICD-10-CM

## 2016-12-05 DIAGNOSIS — E871 Hypo-osmolality and hyponatremia: Secondary | ICD-10-CM

## 2016-12-05 DIAGNOSIS — R590 Localized enlarged lymph nodes: Secondary | ICD-10-CM | POA: Diagnosis present

## 2016-12-05 DIAGNOSIS — R599 Enlarged lymph nodes, unspecified: Secondary | ICD-10-CM

## 2016-12-05 DIAGNOSIS — Z79899 Other long term (current) drug therapy: Secondary | ICD-10-CM

## 2016-12-05 DIAGNOSIS — I6529 Occlusion and stenosis of unspecified carotid artery: Secondary | ICD-10-CM | POA: Diagnosis present

## 2016-12-05 DIAGNOSIS — T4275XA Adverse effect of unspecified antiepileptic and sedative-hypnotic drugs, initial encounter: Secondary | ICD-10-CM | POA: Diagnosis not present

## 2016-12-05 DIAGNOSIS — Z7189 Other specified counseling: Secondary | ICD-10-CM | POA: Diagnosis not present

## 2016-12-05 DIAGNOSIS — C3411 Malignant neoplasm of upper lobe, right bronchus or lung: Secondary | ICD-10-CM | POA: Diagnosis not present

## 2016-12-05 DIAGNOSIS — Z66 Do not resuscitate: Secondary | ICD-10-CM | POA: Diagnosis present

## 2016-12-05 DIAGNOSIS — C3490 Malignant neoplasm of unspecified part of unspecified bronchus or lung: Principal | ICD-10-CM | POA: Diagnosis present

## 2016-12-05 DIAGNOSIS — W19XXXA Unspecified fall, initial encounter: Secondary | ICD-10-CM

## 2016-12-05 DIAGNOSIS — Z6825 Body mass index (BMI) 25.0-25.9, adult: Secondary | ICD-10-CM | POA: Diagnosis not present

## 2016-12-05 DIAGNOSIS — D689 Coagulation defect, unspecified: Secondary | ICD-10-CM

## 2016-12-05 DIAGNOSIS — C770 Secondary and unspecified malignant neoplasm of lymph nodes of head, face and neck: Secondary | ICD-10-CM

## 2016-12-05 DIAGNOSIS — R05 Cough: Secondary | ICD-10-CM | POA: Diagnosis not present

## 2016-12-05 DIAGNOSIS — E222 Syndrome of inappropriate secretion of antidiuretic hormone: Secondary | ICD-10-CM | POA: Diagnosis present

## 2016-12-05 DIAGNOSIS — Z8249 Family history of ischemic heart disease and other diseases of the circulatory system: Secondary | ICD-10-CM

## 2016-12-05 DIAGNOSIS — R0602 Shortness of breath: Secondary | ICD-10-CM | POA: Diagnosis not present

## 2016-12-05 LAB — PROTIME-INR
INR: 3.49
PROTHROMBIN TIME: 35.9 s — AB (ref 11.4–15.2)

## 2016-12-05 LAB — COMPREHENSIVE METABOLIC PANEL
ALK PHOS: 67 U/L (ref 38–126)
ALT: 14 U/L (ref 14–54)
ANION GAP: 6 (ref 5–15)
AST: 32 U/L (ref 15–41)
Albumin: 3.8 g/dL (ref 3.5–5.0)
BUN: 12 mg/dL (ref 6–20)
CALCIUM: 9.5 mg/dL (ref 8.9–10.3)
CO2: 26 mmol/L (ref 22–32)
CREATININE: 1.02 mg/dL — AB (ref 0.44–1.00)
Chloride: 98 mmol/L — ABNORMAL LOW (ref 101–111)
GFR calc non Af Amer: 48 mL/min — ABNORMAL LOW (ref >60)
GFR, EST AFRICAN AMERICAN: 56 mL/min — AB (ref >60)
Glucose, Bld: 131 mg/dL — ABNORMAL HIGH (ref 65–99)
Potassium: 4.8 mmol/L (ref 3.5–5.1)
SODIUM: 130 mmol/L — AB (ref 135–145)
Total Bilirubin: 1.2 mg/dL (ref 0.3–1.2)
Total Protein: 6.8 g/dL (ref 6.5–8.1)

## 2016-12-05 LAB — CBC
HCT: 34 % — ABNORMAL LOW (ref 35.0–47.0)
HEMOGLOBIN: 12.2 g/dL (ref 12.0–16.0)
MCH: 33.2 pg (ref 26.0–34.0)
MCHC: 35.7 g/dL (ref 32.0–36.0)
MCV: 92.9 fL (ref 80.0–100.0)
PLATELETS: 131 10*3/uL — AB (ref 150–440)
RBC: 3.66 MIL/uL — AB (ref 3.80–5.20)
RDW: 12.6 % (ref 11.5–14.5)
WBC: 4.4 10*3/uL (ref 3.6–11.0)

## 2016-12-05 LAB — TROPONIN I

## 2016-12-05 LAB — BRAIN NATRIURETIC PEPTIDE: B NATRIURETIC PEPTIDE 5: 355 pg/mL — AB (ref 0.0–100.0)

## 2016-12-05 LAB — APTT: aPTT: 50 seconds — ABNORMAL HIGH (ref 24–36)

## 2016-12-05 MED ORDER — HYDROCODONE-ACETAMINOPHEN 5-325 MG PO TABS: 1.0000 | ORAL_TABLET | ORAL | Status: DC | PRN

## 2016-12-05 MED ORDER — LOSARTAN POTASSIUM 50 MG PO TABS
100.0000 mg | ORAL_TABLET | Freq: Every day | ORAL | Status: DC
  Administered 2016-12-06 – 2016-12-08 (×3): 100 mg via ORAL
  Filled 2016-12-05 (×3): qty 2

## 2016-12-05 MED ORDER — PANTOPRAZOLE SODIUM 40 MG PO TBEC
40.0000 mg | DELAYED_RELEASE_TABLET | Freq: Every day | ORAL | Status: DC
  Administered 2016-12-06 – 2016-12-09 (×4): 40 mg via ORAL
  Filled 2016-12-05 (×4): qty 1

## 2016-12-05 MED ORDER — CARVEDILOL 12.5 MG PO TABS
12.5000 mg | ORAL_TABLET | Freq: Two times a day (BID) | ORAL | Status: DC
  Administered 2016-12-05 – 2016-12-08 (×6): 12.5 mg via ORAL
  Filled 2016-12-05 (×6): qty 1

## 2016-12-05 MED ORDER — BISACODYL 5 MG PO TBEC: 5.0000 mg | DELAYED_RELEASE_TABLET | Freq: Every day | ORAL | Status: DC | PRN

## 2016-12-05 MED ORDER — IOPAMIDOL (ISOVUE-300) INJECTION 61%
75.0000 mL | Freq: Once | INTRAVENOUS | Status: AC | PRN
  Administered 2016-12-05: 75 mL via INTRAVENOUS

## 2016-12-05 MED ORDER — ONDANSETRON HCL 4 MG PO TABS: 4.0000 mg | ORAL_TABLET | Freq: Four times a day (QID) | ORAL | Status: DC | PRN

## 2016-12-05 MED ORDER — ACETAMINOPHEN 650 MG RE SUPP: 650.0000 mg | Freq: Four times a day (QID) | RECTAL | Status: DC | PRN

## 2016-12-05 MED ORDER — DEXTROSE 5 % IV SOLN
500.0000 mg | Freq: Once | INTRAVENOUS | Status: AC
  Administered 2016-12-05: 500 mg via INTRAVENOUS
  Filled 2016-12-05: qty 500

## 2016-12-05 MED ORDER — ALBUTEROL SULFATE (2.5 MG/3ML) 0.083% IN NEBU: 2.5000 mg | INHALATION_SOLUTION | RESPIRATORY_TRACT | Status: DC | PRN

## 2016-12-05 MED ORDER — ACETAMINOPHEN 325 MG PO TABS
650.0000 mg | ORAL_TABLET | Freq: Four times a day (QID) | ORAL | Status: DC | PRN
  Filled 2016-12-05: qty 1

## 2016-12-05 MED ORDER — ONDANSETRON HCL 4 MG/2ML IJ SOLN: 4.0000 mg | Freq: Four times a day (QID) | INTRAMUSCULAR | Status: DC | PRN

## 2016-12-05 MED ORDER — KETOROLAC TROMETHAMINE 15 MG/ML IJ SOLN: 15.0000 mg | Freq: Four times a day (QID) | INTRAMUSCULAR | Status: DC | PRN

## 2016-12-05 MED ORDER — SODIUM CHLORIDE 0.9 % IV SOLN
INTRAVENOUS | Status: DC
  Administered 2016-12-05 – 2016-12-06 (×2): via INTRAVENOUS

## 2016-12-05 MED ORDER — SENNOSIDES-DOCUSATE SODIUM 8.6-50 MG PO TABS: 1.0000 | ORAL_TABLET | Freq: Every evening | ORAL | Status: DC | PRN

## 2016-12-05 MED ORDER — CEFTRIAXONE SODIUM 1 G IJ SOLR: 1.0000 g | Freq: Once | INTRAMUSCULAR | Status: DC

## 2016-12-05 MED ORDER — CEFTRIAXONE SODIUM-DEXTROSE 1-3.74 GM-% IV SOLR
1.0000 g | Freq: Once | INTRAVENOUS | Status: AC
  Administered 2016-12-05: 22:00:00 1 g via INTRAVENOUS
  Filled 2016-12-05: qty 50

## 2016-12-05 MED ORDER — CLONAZEPAM 0.5 MG PO TABS
0.2500 mg | ORAL_TABLET | Freq: Every day | ORAL | Status: DC | PRN
  Administered 2016-12-05 – 2016-12-08 (×5): 0.25 mg via ORAL
  Filled 2016-12-05 (×5): qty 1

## 2016-12-05 NOTE — ED Triage Notes (Signed)
Pt with shortness of breath for one mth.

## 2016-12-05 NOTE — ED Notes (Addendum)
Pt requests to be a FULL CODE   MD Bridgett Larsson made aware

## 2016-12-05 NOTE — Consult Note (Signed)
ANTICOAGULATION CONSULT NOTE - Initial Consult  Pharmacy Consult for warfarin Indication: atrial fibrillation  No Known Allergies  Patient Measurements: Height: '5\' 6"'$  (167.6 cm) Weight: 155 lb (70.3 kg) IBW/kg (Calculated) : 59.3 Heparin Dosing Weight:   Vital Signs: Temp: 97.7 F (36.5 C) (01/23 2022) Temp Source: Oral (01/23 2022) BP: 185/92 (01/23 2022) Pulse Rate: 93 (01/23 2022)  Labs:  Recent Labs  12/05/16 1145 12/05/16 1657  HGB  --  12.2  HCT  --  34.0*  PLT  --  131*  APTT  --  50*  LABPROT  --  35.9*  INR  --  3.49  CREATININE 1.02*  --   TROPONINI  --  <0.03    Estimated Creatinine Clearance: 37.1 mL/min (by C-G formula based on SCr of 1.02 mg/dL (H)).   Medical History: Past Medical History:  Diagnosis Date  . Atrial fibrillation ([place])   . Carotid stenosis   . Cholelithiasis   . Chronic kidney disease   . Chronic low back pain   . Gallstone pancreatitis 11-10-14  . Gallstones   . GERD (gastroesophageal reflux disease)   . Hyperlipidemia   . Hypertension   . Impaired fasting glucose   . Osteoarthritis     Medications:  Scheduled:  . carvedilol  12.5 mg Oral BID WC  . cefTRIAXone  1 g Intravenous Once  . [START ON 12/06/2016] losartan  100 mg Oral Daily  . [START ON 12/06/2016] pantoprazole  40 mg Oral Daily    Assessment: Pt is a 83 year old female with lung cancer and afbi on warfarin at home. Pt INR on admission is 3.49. Home dose is 2.'5mg'$  daily. Of note INR was also high on 1/3 at anticoag visit and was instructed to hold warfarin x 1 and then resume dose. Pt on azithromycin from 1/8-1/12.  Goal of Therapy:  INR 2-3 Monitor platelets by anticoagulation protocol: Yes   Plan:  Hold warfarin due to elevated INR. Also holding warfarin for possible biopsy or thoracentesis per Dr. Bridgett Larsson. Recheck INR in AM and follow up for plan.  Ramond Dial, Pharm.D, BCPS Clinical Pharmacist  12/05/2016,9:33 PM

## 2016-12-05 NOTE — Telephone Encounter (Signed)
Spoke with  [name] [name] daughter patient currently in ER, just had EKG, chest xray. Patient was having  shortness of breath, pushing on stomach made her short of breath.   [name] [name] will keep [place] updated.

## 2016-12-05 NOTE — Telephone Encounter (Signed)
Daughter requested a call, pt did not go to the ER last night. Daughter questioned her other options for pt to have a Xray.  Contact Hinton Dyer [phone]

## 2016-12-05 NOTE — ED Provider Notes (Signed)
[facility] Emergency Department Provider Note  ____________________________________________  Time seen: Approximately 4:13 PM  I have reviewed the triage vital signs and the nursing notes.   HISTORY  Chief Complaint Shortness of Breath    HPI [name] [name] is a 83 y.o. female with a history of A. fib on Coumadin, carotid stenosis, HTN, HL, and remote significant smoking history presenting with exertional dyspnea. The patient reports that over the last several months, she has had a progressively worsening shortness of breath that is most notable with exertion. Several weeks ago, she also developed a severe dry cough and was treated 10 days ago with a Z-Pak with no improvement. At that time, chest x-ray was obtained which showed a "abnormality" in the right lung. Yesterday, she walked from her car to a store and felt significantly dyspneic. She has not had any chest pain, fever, congestion or rhinorrhea, sore throat, lower extremity swelling or calf pain, palpitations, lightheadedness or syncope.   Past Medical History:  Diagnosis Date  . Atrial fibrillation ([place])   . Carotid stenosis   . Cholelithiasis   . Chronic kidney disease   . Chronic low back pain   . Gallstone pancreatitis 11-10-14  . Gallstones   . GERD (gastroesophageal reflux disease)   . Hyperlipidemia   . Hypertension   . Impaired fasting glucose   . Osteoarthritis     Patient Active Problem List   Diagnosis Date Noted  . Neck nodule 11/19/2016  . Cough 11/19/2016  . Health care maintenance 09/17/2016  . Leg weakness 08/27/2016  . Right knee buckling 08/09/2016  . Leg lesion 02/28/2016  . Aortic valve stenosis 08/04/2015  . Calculus of bile duct with acute cholangitis with obstruction 11/25/2014  . Gallstone 09/29/2014  . Abnormal liver function tests 09/13/2014  . Encounter for therapeutic drug monitoring 12/10/2013  . Liver cyst 10/01/2013  . Anemia 04/09/2013  .  Thrombocytopenia ([place]) 04/09/2013  . Carotid stenosis 03/20/2012  . Smoking 06/07/2011  . Long term current use of anticoagulant 01/31/2011  . SHORTNESS OF BREATH 05/10/2010  . Hyperlipidemia 10/27/2009  . ESSENTIAL HYPERTENSION, BENIGN 08/26/2009  . Essential hypertension 08/26/2009  . Atrial fibrillation ([place]) 08/26/2009  . PVD 08/26/2009  . CAROTID BRUIT 08/26/2009    Past Surgical History:  Procedure Laterality Date  . APPENDECTOMY    . CATARACT EXTRACTION     x2  . CHOLECYSTECTOMY  11-10-14  . CHOLECYSTECTOMY      Current Outpatient Rx  . Order #: [id number] Class: Normal  . Order #: [id number] Class: Normal  . Order #: [id number] Class: Print  . Order #: [id number] Class: Normal  . Order #: [id number] Class: Normal  . Order #: [id number] Class: Historical Med  . Order #: [id number] Class: Normal  . Order #: [id number] Class: Normal  . Order #: [id number] Class: Historical Med  . Order #: [id number] Class: Normal    Allergies Patient has no known allergies.  Family History  Problem Relation Age of Onset  . Heart attack Father 74  . Atrial fibrillation Sister   . Atrial fibrillation Brother   . Heart failure Sister 42  . Stroke Sister     Social History Social History  Substance Use Topics  . Smoking status: Former Smoker    Packs/day: 0.50    Types: Cigarettes    Quit date: 11/04/2014  . Smokeless tobacco: Never Used     Comment: pt stopped two weeks ago  . Alcohol use 0.0 oz/week     Comment: occasional    Review  of Systems Constitutional: No fever/chills.No lightheadedness or syncope. No malaise. Positive general fatigue. Eyes: No visual changes. ENT: No sore throat. No congestion or rhinorrhea. Cardiovascular: Denies chest pain. Denies palpitations. Respiratory: Has attentive shortness of breath.  Positive cough. Gastrointestinal: No abdominal pain.  No nausea, no vomiting.  No diarrhea.  No constipation. Genitourinary: Negative for dysuria. Musculoskeletal:  Negative for back pain. No lower extremity swelling or calf pain. Skin: Negative for rash. Neurological: Negative for headaches. No focal numbness, tingling or weakness.   10-point ROS otherwise negative.  ____________________________________________   PHYSICAL EXAM:  VITAL SIGNS: ED Triage Vitals [12/05/16 1144]  Enc Vitals Group     BP      Pulse      Resp      Temp      Temp src      SpO2      Weight 155 lb (70.3 kg)     Height '5\' 6"'$  (1.676 m)     Head Circumference      Peak Flow      Pain Score      Pain Loc      Pain Edu?      Excl. in [place]?     Constitutional: Alert and oriented. Well appearing and in no acute distress. Answers questions appropriately. Eyes: Conjunctivae are normal.  EOMI. No scleral icterus. Head: Atraumatic. Nose: No congestion/rhinnorhea. Mouth/Throat: Mucous membranes are moist.  Neck: No stridor.  Supple.  No JVD. Cardiovascular: Normal rate, irregular rhythm. No murmurs, rubs or gallops.  Respiratory: Normal respiratory effort.  No accessory muscle use or retractions. Lungs have minimal decrease in air exchange in the right base, but otherwise no wheezes rales or rhonchi.  Gastrointestinal: Soft, nontender and nondistended.  No guarding or rebound.  No peritoneal signs. Musculoskeletal: No LE edema. No ttp in the calves or palpable cords.  Negative Homan's sign. Neurologic:  A&Ox3.  Speech is clear.  Face and smile are symmetric.  EOMI.  Moves all extremities well. Skin:  Skin is warm, dry and intact. No rash noted. Psychiatric: Mood and affect are normal. Speech and behavior are normal.  Normal judgement.  ____________________________________________   LABS (all labs ordered are listed, but only abnormal results are displayed)  Labs Reviewed  COMPREHENSIVE METABOLIC PANEL - Abnormal; Notable for the following:       Result Value   Sodium 130 (*)    Chloride 98 (*)    Glucose, Bld 131 (*)    Creatinine, Ser 1.02 (*)    GFR calc non  Af Amer 48 (*)    GFR calc Af Amer 56 (*)    All other components within normal limits  CBC - Abnormal; Notable for the following:    RBC 3.66 (*)    HCT 34.0 (*)    Platelets 131 (*)    All other components within normal limits  PROTIME-INR - Abnormal; Notable for the following:    Prothrombin Time 35.9 (*)    All other components within normal limits  APTT - Abnormal; Notable for the following:    aPTT 50 (*)    All other components within normal limits  CULTURE, BLOOD (ROUTINE X 2)  CULTURE, BLOOD (ROUTINE X 2)  URINE CULTURE  TROPONIN I  BRAIN NATRIURETIC PEPTIDE   ____________________________________________  EKG  ED ECG REPORT I, Eula Listen, the attending physician, personally viewed and interpreted this ECG.   Date: 12/05/2016  EKG Time: 1152  Rate: 73  Rhythm: atrial fibrillation  Axis: leftward  Intervals:none  ST&T Change: Nonspecific T-wave inversion in V1. No ST elevation.  ____________________________________________  RADIOLOGY  Dg Chest 2 View  Result Date: 12/05/2016 CLINICAL DATA:  One month of shortness of breath. History of atrial fibrillation, aortic stenosis, discontinued smoking 2 years ago. EXAM: CHEST  2 VIEW COMPARISON:  PA and lateral chest x-ray of November 17, 2016 FINDINGS: Since the previous study these right-sided pleural effusion has increased in volume. The right hemidiaphragm is obscured. The left lung is well-expanded. There is a trace of pleural fluid on the left. The heart is top-normal in size. The pulmonary vascularity is mildly prominent. There is soft tissue fullness in the right hilar region more conspicuous than in the past. There is mitral annular calcification. There is calcification in the wall of the thoracic aorta. IMPRESSION: COPD. Increased volume of the right-sided pleural effusion which now occupies approximately 1/3 of the pleural space volume. This may be obscuring a basilar pneumonia or neoplasm. Persistent soft  tissue fullness in the right hilar region new since December 2015. Mild CHF.  Thoracic aortic atherosclerosis. Chest CT scanning is recommended to further evaluate the right hilum and right lower hemithorax. Electronically Signed   By: David  Martinique M.D.   On: 12/05/2016 12:42   Ct Chest W Contrast  Result Date: 12/05/2016 CLINICAL DATA:  Plain films demonstrating right-sided airspace disease and suggestion of right hilar soft tissue fullness. Shortness of breath for 1 month. EXAM: CT CHEST WITH CONTRAST TECHNIQUE: Multidetector CT imaging of the chest was performed during intravenous contrast administration. CONTRAST:  21m ISOVUE-300 IOPAMIDOL (ISOVUE-300) INJECTION 61% COMPARISON:  Multiple plain films, most recent 12/05/2016. No prior CTs for comparison. FINDINGS: Cardiovascular: Advanced aortic and branch vessel atherosclerosis. Moderate cardiomegaly with no pericardial effusion. Multivessel coronary artery atherosclerosis. No central pulmonary embolism, on this non-dedicated study. Narrowing of the confluence of the brachiocephalic veins and less so the superior aspect of the SVC. Example image 43/series 2. Mediastinum/Nodes: Massive bilateral supraclavicular adenopathy, including at 3.2 cm in the left supraclavicular space. Marked mediastinal adenopathy, with a right paratracheal node measuring 3.4 cm on image 54/series 2. Subcarinal node measures 2.5 cm on image 75/series 2. Right hilar adenopathy at 2.5 cm. Moderate hiatal hernia.  Anterior mediastinal nodal mass at 2.6 cm. Lungs/Pleura: Small to moderate right-sided pleural effusion, without well-defined pleural mass Minimal motion degradation. Patent airways with mass-effect upon right lower and middle lobe bronchi. Right middle lobe partial collapse. Dependent right lower lobe airspace disease. Soft tissue mass centered at the lateral aspect of the origin of the right middle lobe bronchus could be pulmonary parenchymal or nodal. Measures 2.9 x 3.9 cm  on image 79/series 2. Upper Abdomen: Cholecystectomy. Pneumobilia. Hypoattenuating lateral segment left liver lobe lesion is 1.5 cm and represent a cyst or complex cyst on image 129/series 2. Heterogeneous enhancement involving the remainder of the liver is subtle. Normal imaged portions of the spleen, pancreas, kidneys. Normal imaged adrenal glands. Abdominal aortic atherosclerosis. No upper abdominal adenopathy. Musculoskeletal: Left axillary adenopathy, include at 1.6 cm on image 33/series 2. Osteopenia. Mildly heterogeneous marrow density, especially involving the left side of T8 vertebral body. IMPRESSION: 1. Massive adenopathy in the low neck and chest, most consistent with neoplasm. Considerations include small-cell lung cancer or lymphoma. The only potential side of primary in the lungs is an area of soft tissue fullness in the right infrahilar region, just lateral to the origin of the right middle lobe bronchus. This could represent the primary pulmonary mass  or adenopathy. 2. Right-sided pleural effusion with right middle lobe partial collapse 3. Heterogeneous hepatic density is nonspecific. Metastatic disease cannot be excluded. The largest hypoattenuating area is relatively low density and could represent a cyst or complex cyst. Recommend attention on follow-up. 4. Probable osseous metastasis, most apparent at T8. 5. Consider further evaluation with nonemergent PET and/or thoracic surgery/oncology consultation. 6. Given the extent of upper thoracic adenopathy, the patient may be predisposed to SVC syndrome. Electronically Signed   By: Abigail Miyamoto M.D.   On: 12/05/2016 17:38    ____________________________________________   PROCEDURES  Procedure(s) performed: None  Procedures  Critical Care performed: No ____________________________________________   INITIAL IMPRESSION / ASSESSMENT AND PLAN / ED COURSE  Pertinent labs & imaging results that were available during my care of the patient  were reviewed by me and considered in my medical decision making (see chart for details).  83 y.o. female with a remote smoking history presenting with several months of progressive shortness of breath and nonproductive cough that did not improve with a Z-Pak. The patient is hemodynamically stable and has atrial fibrillation that is rate controlled, per her baseline. Her chest x-ray is concerning for an enlarging right-sided pleural effusion, with concerns for underlying mass or infection. The patient has not been having fever, or other URI symptoms, but I will cover her for pneumonia. However, have spoken with her and her family member about the likely had of underlying mass and potentially and likely malignancy. Patient also has hyponatremia and acute renal insufficiency today. We'll plan admission to the hospital.  ----------------------------------------- 5:48 PM on 12/05/2016 -----------------------------------------  The patient's CT scan is concerning for multiple findings including a hilar mass, the known pleural effusion from the chest x-ray, significant adenopathy, as well as what may be a bony metastasis at T8. She also has a lesion on the liver. I have discussed these findings with the patient and will admit her at this time.  ____________________________________________  FINAL CLINICAL IMPRESSION(S) / ED DIAGNOSES  Final diagnoses:  Hyponatremia  Acute renal insufficiency  Pleural effusion on right  Hilar mass  Bone lesion    Clinical Course as of Dec 05 1746  Tue Dec 05, 2016  1637 DG Chest 2 View [JH]    Clinical Course User Index [JH] [place], [place]      NEW MEDICATIONS STARTED DURING THIS VISIT:  New Prescriptions   No medications on file      Eula Listen, MD 12/05/16 [phone]

## 2016-12-05 NOTE — Progress Notes (Signed)
Advanced Care Plan.  Purpose of Encounter: CODE STATUS Parties in Attendance: The patient, her daughter and me.  Patient's Decisional Capacity: YES. Subjective/Patient Story: Shortness of breath for several months, worsening for a few days. Objective/Medical Story: 83 y.o. female with a known history of A. fib, carotid stenosis, CKD and hypertension. The patient has had exertional dyspnea for several months, which has been worsening progressively for the past a few weeks. She developed severe dry cough and was treated 10 with Z-Pak 10 days ago without improvement. At that time, the chest x-ray showed abnormality in the right lung. She had severe dyspnea on exertion yesterday. CAT scan of the chest show lung mass, right pleural effusion and lymphadenopathy. I discussed with the patient and her daughter about the patient's condition and poor prognosis. The patient wanted DO NOT RESUSCITATE but she changed mind later, and decided full code after discussion with her daughters.  Goals of Care Determinations: Palliative care.  Plan:  Code Status:  Full code.  Time spent discussing advance care planning 17 minutes.

## 2016-12-05 NOTE — H&P (Addendum)
[facility] NAME: [name] [name]    MR#:  [id number]  DATE OF BIRTH:  Jan 08, 1928  DATE OF ADMISSION:  12/05/2016  PRIMARY CARE PHYSICIAN: Einar Pheasant, MD   REQUESTING/REFERRING PHYSICIAN: Eula Listen, MD  CHIEF COMPLAINT:   Chief Complaint  Patient presents with  . Shortness of Breath   Shortness of breath for several months, worsening for a few days. HISTORY OF PRESENT ILLNESS:  [name] [name]  is a 83 y.o. female with a known history of A. fib, carotid stenosis, CKD and hypertension. The patient has had exertional dyspnea for several months, which has been worsening progressively for the past a few weeks. She developed severe dry cough and was treated 10 with Z-Pak 10 days ago without improvement. At that time, the chest x-ray showed abnormality in the right lung. She had severe dyspnea on exertion yesterday. CAT scan of the chest show lung mass, right pleural effusion and lymphadenopathy.  PAST MEDICAL HISTORY:   Past Medical History:  Diagnosis Date  . Atrial fibrillation ([place])   . Carotid stenosis   . Cholelithiasis   . Chronic kidney disease   . Chronic low back pain   . Gallstone pancreatitis 11-10-14  . Gallstones   . GERD (gastroesophageal reflux disease)   . Hyperlipidemia   . Hypertension   . Impaired fasting glucose   . Osteoarthritis     PAST SURGICAL HISTORY:   Past Surgical History:  Procedure Laterality Date  . APPENDECTOMY    . CATARACT EXTRACTION     x2  . CHOLECYSTECTOMY  11-10-14  . CHOLECYSTECTOMY      SOCIAL HISTORY:   Social History  Substance Use Topics  . Smoking status: Former Smoker    Packs/day: 0.50    Types: Cigarettes    Quit date: 11/04/2014  . Smokeless tobacco: Never Used     Comment: pt stopped two weeks ago  . Alcohol use 0.0 oz/week     Comment: occasional    FAMILY HISTORY:   Family History  Problem Relation Age of Onset  . Heart attack  Father 30  . Atrial fibrillation Sister   . Atrial fibrillation Brother   . Heart failure Sister 56  . Stroke Sister     DRUG ALLERGIES:  No Known Allergies  REVIEW OF SYSTEMS:   Review of Systems  Constitutional: Positive for malaise/fatigue. Negative for chills, fever and weight loss.  HENT: Negative for congestion and sore throat.   Eyes: Negative for blurred vision and double vision.  Respiratory: Positive for shortness of breath. Negative for cough, hemoptysis, sputum production, wheezing and stridor.   Cardiovascular: Negative for chest pain, orthopnea and leg swelling.  Gastrointestinal: Negative for abdominal pain, blood in stool, diarrhea, melena, nausea and vomiting.  Genitourinary: Negative for dysuria, frequency and hematuria.  Musculoskeletal: Negative for back pain and joint pain.  Neurological: Positive for weakness. Negative for dizziness, focal weakness and loss of consciousness.  Psychiatric/Behavioral: Negative for depression. The patient is not nervous/anxious.     MEDICATIONS AT HOME:   Prior to Admission medications   Medication Sig Start Date End Date Taking? Authorizing Provider  albuterol (PROVENTIL HFA;VENTOLIN HFA) 108 (90 Base) MCG/ACT inhaler Inhale 2 puffs into the lungs every 6 (six) hours as needed for wheezing or shortness of breath. 09/07/16  Yes Minna Merritts, MD  carvedilol (COREG) 25 MG tablet TAKE 1 TABLET (25 MG TOTAL) BY MOUTH 2 (TWO) TIMES DAILY WITH A MEAL.  Patient taking differently: TAKE 1/2 TABLET (12.5 MG TOTAL) BY MOUTH 2 (TWO) TIMES DAILY WITH A MEAL. 05/23/16  Yes Crecencio Mc, MD  clonazePAM (KLONOPIN) 0.5 MG tablet TAKE 1/2 TO 1 TABLET BY MOUTH ONCE DAILY AS NEEDED 11/14/16  Yes Einar Pheasant, MD  hydrALAZINE (APRESOLINE) 10 MG tablet TAKE 1 TABLET BY MOUTH 3 TIMES A DAY 03/31/16  Yes Einar Pheasant, MD  losartan (COZAAR) 100 MG tablet TAKE 1 TABLET BY MOUTH DAILY. 04/12/16  Yes Einar Pheasant, MD  omeprazole (PRILOSEC) 20 MG  capsule Take 20 mg by mouth daily.   Yes Historical Provider, MD  pravastatin (PRAVACHOL) 40 MG tablet TAKE 1 TABLET (40 MG TOTAL) BY MOUTH DAILY. 12/03/15  Yes Minna Merritts, MD  warfarin (COUMADIN) 5 MG tablet TAKE AS DIRECTED BY ANTICOAGULATION CLINIC Patient taking differently: 1/2 tablet daily 10/09/16  Yes Minna Merritts, MD  warfarin (COUMADIN) 5 MG tablet Take 2.5 mg by mouth daily.   Yes Historical Provider, MD  azithromycin (ZITHROMAX) 250 MG tablet Take two tablets x 1 day and then one tablet per day for four more days. Patient not taking: Reported on 12/05/2016 11/20/16   Einar Pheasant, MD      VITAL SIGNS:  Blood pressure (!) 168/96, height '5\' 6"'$  (1.676 m), weight 155 lb (70.3 kg).  PHYSICAL EXAMINATION:  Physical Exam  GENERAL:  83 y.o.-year-old patient lying in the bed with no acute distress.  EYES: Pupils equal, round, reactive to light and accommodation. No scleral icterus. Extraocular muscles intact.  HEENT: Head atraumatic, normocephalic. Oropharynx and nasopharynx clear. Positive lymphadenopathy. NECK:  Supple, no jugular venous distention. No thyroid enlargement, no tenderness.  LUNGS: Diminished breath sounds on right side, no wheezing, rales,rhonchi or crepitation. No use of accessory muscles of respiration.  CARDIOVASCULAR: S1, S2 normal. No murmurs, rubs, or gallops.  ABDOMEN: Soft, nontender, nondistended. Bowel sounds present. No organomegaly or mass.  EXTREMITIES: No pedal edema, cyanosis, or clubbing.  NEUROLOGIC: Cranial nerves II through XII are intact. Muscle strength 5/5 in all extremities. Sensation intact. Gait not checked.  PSYCHIATRIC: The patient is alert and oriented x 3.  SKIN: No obvious rash, lesion, or ulcer.   LABORATORY PANEL:   CBC  Recent Labs Lab 12/05/16 1657  WBC 4.4  HGB 12.2  HCT 34.0*  PLT 131*    ------------------------------------------------------------------------------------------------------------------  Chemistries   Recent Labs Lab 12/05/16 1145  NA 130*  K 4.8  CL 98*  CO2 26  GLUCOSE 131*  BUN 12  CREATININE 1.02*  CALCIUM 9.5  AST 32  ALT 14  ALKPHOS 67  BILITOT 1.2   ------------------------------------------------------------------------------------------------------------------  Cardiac Enzymes  Recent Labs Lab 12/05/16 1657  TROPONINI <0.03   ------------------------------------------------------------------------------------------------------------------  RADIOLOGY:  Dg Chest 2 View  Result Date: 12/05/2016 CLINICAL DATA:  One month of shortness of breath. History of atrial fibrillation, aortic stenosis, discontinued smoking 2 years ago. EXAM: CHEST  2 VIEW COMPARISON:  PA and lateral chest x-ray of November 17, 2016 FINDINGS: Since the previous study these right-sided pleural effusion has increased in volume. The right hemidiaphragm is obscured. The left lung is well-expanded. There is a trace of pleural fluid on the left. The heart is top-normal in size. The pulmonary vascularity is mildly prominent. There is soft tissue fullness in the right hilar region more conspicuous than in the past. There is mitral annular calcification. There is calcification in the wall of the thoracic aorta. IMPRESSION: COPD. Increased volume of the right-sided pleural effusion which now  occupies approximately 1/3 of the pleural space volume. This may be obscuring a basilar pneumonia or neoplasm. Persistent soft tissue fullness in the right hilar region new since December 2015. Mild CHF.  Thoracic aortic atherosclerosis. Chest CT scanning is recommended to further evaluate the right hilum and right lower hemithorax. Electronically Signed   By: David  Martinique M.D.   On: 12/05/2016 12:42   Ct Chest W Contrast  Result Date: 12/05/2016 CLINICAL DATA:  Plain films demonstrating  right-sided airspace disease and suggestion of right hilar soft tissue fullness. Shortness of breath for 1 month. EXAM: CT CHEST WITH CONTRAST TECHNIQUE: Multidetector CT imaging of the chest was performed during intravenous contrast administration. CONTRAST:  108m ISOVUE-300 IOPAMIDOL (ISOVUE-300) INJECTION 61% COMPARISON:  Multiple plain films, most recent 12/05/2016. No prior CTs for comparison. FINDINGS: Cardiovascular: Advanced aortic and branch vessel atherosclerosis. Moderate cardiomegaly with no pericardial effusion. Multivessel coronary artery atherosclerosis. No central pulmonary embolism, on this non-dedicated study. Narrowing of the confluence of the brachiocephalic veins and less so the superior aspect of the SVC. Example image 43/series 2. Mediastinum/Nodes: Massive bilateral supraclavicular adenopathy, including at 3.2 cm in the left supraclavicular space. Marked mediastinal adenopathy, with a right paratracheal node measuring 3.4 cm on image 54/series 2. Subcarinal node measures 2.5 cm on image 75/series 2. Right hilar adenopathy at 2.5 cm. Moderate hiatal hernia.  Anterior mediastinal nodal mass at 2.6 cm. Lungs/Pleura: Small to moderate right-sided pleural effusion, without well-defined pleural mass Minimal motion degradation. Patent airways with mass-effect upon right lower and middle lobe bronchi. Right middle lobe partial collapse. Dependent right lower lobe airspace disease. Soft tissue mass centered at the lateral aspect of the origin of the right middle lobe bronchus could be pulmonary parenchymal or nodal. Measures 2.9 x 3.9 cm on image 79/series 2. Upper Abdomen: Cholecystectomy. Pneumobilia. Hypoattenuating lateral segment left liver lobe lesion is 1.5 cm and represent a cyst or complex cyst on image 129/series 2. Heterogeneous enhancement involving the remainder of the liver is subtle. Normal imaged portions of the spleen, pancreas, kidneys. Normal imaged adrenal glands. Abdominal aortic  atherosclerosis. No upper abdominal adenopathy. Musculoskeletal: Left axillary adenopathy, include at 1.6 cm on image 33/series 2. Osteopenia. Mildly heterogeneous marrow density, especially involving the left side of T8 vertebral body. IMPRESSION: 1. Massive adenopathy in the low neck and chest, most consistent with neoplasm. Considerations include small-cell lung cancer or lymphoma. The only potential side of primary in the lungs is an area of soft tissue fullness in the right infrahilar region, just lateral to the origin of the right middle lobe bronchus. This could represent the primary pulmonary mass or adenopathy. 2. Right-sided pleural effusion with right middle lobe partial collapse 3. Heterogeneous hepatic density is nonspecific. Metastatic disease cannot be excluded. The largest hypoattenuating area is relatively low density and could represent a cyst or complex cyst. Recommend attention on follow-up. 4. Probable osseous metastasis, most apparent at T8. 5. Consider further evaluation with nonemergent PET and/or thoracic surgery/oncology consultation. 6. Given the extent of upper thoracic adenopathy, the patient may be predisposed to SVC syndrome. Electronically Signed   By: KAbigail MiyamotoM.D.   On: 12/05/2016 17:38      IMPRESSION AND PLAN:   Lung cancer with metastasis and right-sided pleural effusion The patient will be admitted to medical floor. Follow-up oncology consult for further recommendation. Hyponatremia. Start normal saline IV and follow-up BMP. Chronic A. Fib. Hold Coumadin for possible biopsy or thoracentesis and follow-up INR. Continue Coreg. Hypertension. Continue hypertension medication.  All the records are reviewed and case discussed with ED provider. Management plans discussed with the patient, her daughter and they are in agreement. Initially the patient wants DO NOT RESUSCITATE, then after discussion with her family, she changed to full code  CODE STATUS: Full  code.  TOTAL TIME TAKING CARE OF THIS PATIENT:53 minutes.    Demetrios Loll M.D on 12/05/2016 at 6:57 PM  Between 7am to 6pm - Pager - [phone]  After 6pm go to [email or web address] - Proofreader  [facility] [employer] [facility]  Office  [phone]  CC: Primary care physician; Einar Pheasant, MD   Note: This dictation was prepared with Dragon dictation along with smaller phrase technology. Any transcriptional errors that result from this process are unintentional.

## 2016-12-06 ENCOUNTER — Encounter: Payer: Self-pay | Admitting: Cardiovascular Disease

## 2016-12-06 ENCOUNTER — Inpatient Hospital Stay: Payer: Medicare Other

## 2016-12-06 DIAGNOSIS — Z515 Encounter for palliative care: Secondary | ICD-10-CM

## 2016-12-06 DIAGNOSIS — Z87891 Personal history of nicotine dependence: Secondary | ICD-10-CM

## 2016-12-06 DIAGNOSIS — N189 Chronic kidney disease, unspecified: Secondary | ICD-10-CM

## 2016-12-06 DIAGNOSIS — I10 Essential (primary) hypertension: Secondary | ICD-10-CM

## 2016-12-06 DIAGNOSIS — J90 Pleural effusion, not elsewhere classified: Secondary | ICD-10-CM

## 2016-12-06 DIAGNOSIS — R590 Localized enlarged lymph nodes: Secondary | ICD-10-CM

## 2016-12-06 DIAGNOSIS — R05 Cough: Secondary | ICD-10-CM

## 2016-12-06 DIAGNOSIS — Z7189 Other specified counseling: Secondary | ICD-10-CM

## 2016-12-06 DIAGNOSIS — I4891 Unspecified atrial fibrillation: Secondary | ICD-10-CM

## 2016-12-06 DIAGNOSIS — Z7901 Long term (current) use of anticoagulants: Secondary | ICD-10-CM

## 2016-12-06 DIAGNOSIS — R0602 Shortness of breath: Secondary | ICD-10-CM

## 2016-12-06 DIAGNOSIS — C3490 Malignant neoplasm of unspecified part of unspecified bronchus or lung: Principal | ICD-10-CM

## 2016-12-06 DIAGNOSIS — K219 Gastro-esophageal reflux disease without esophagitis: Secondary | ICD-10-CM

## 2016-12-06 DIAGNOSIS — M1990 Unspecified osteoarthritis, unspecified site: Secondary | ICD-10-CM

## 2016-12-06 DIAGNOSIS — E785 Hyperlipidemia, unspecified: Secondary | ICD-10-CM

## 2016-12-06 LAB — PROTIME-INR
INR: 3.37
PROTHROMBIN TIME: 34.9 s — AB (ref 11.4–15.2)

## 2016-12-06 LAB — BASIC METABOLIC PANEL
ANION GAP: 8 (ref 5–15)
BUN: 10 mg/dL (ref 6–20)
CALCIUM: 9.1 mg/dL (ref 8.9–10.3)
CO2: 25 mmol/L (ref 22–32)
Chloride: 97 mmol/L — ABNORMAL LOW (ref 101–111)
Creatinine, Ser: 0.76 mg/dL (ref 0.44–1.00)
GFR calc Af Amer: >60 mL/min (ref >60)
Glucose, Bld: 103 mg/dL — ABNORMAL HIGH (ref 65–99)
POTASSIUM: 4.1 mmol/L (ref 3.5–5.1)
SODIUM: 130 mmol/L — AB (ref 135–145)

## 2016-12-06 LAB — ABO/RH: ABO/RH(D): A POS

## 2016-12-06 MED ORDER — HEPARIN SOD (PORK) LOCK FLUSH 100 UNIT/ML IV SOLN: 250.0000 [IU] | INTRAVENOUS | Status: DC | PRN

## 2016-12-06 MED ORDER — ACETAMINOPHEN 325 MG PO TABS
650.0000 mg | ORAL_TABLET | Freq: Once | ORAL | Status: AC
  Administered 2016-12-06: 650 mg via ORAL
  Filled 2016-12-06: qty 2

## 2016-12-06 MED ORDER — VITAMIN K1 10 MG/ML IJ SOLN
5.0000 mg | INTRAMUSCULAR | Status: AC
  Administered 2016-12-06 – 2016-12-07 (×2): 5 mg via SUBCUTANEOUS
  Filled 2016-12-06 (×3): qty 0.5

## 2016-12-06 MED ORDER — SODIUM CHLORIDE 0.9 % IV SOLN
250.0000 mL | Freq: Once | INTRAVENOUS | Status: AC
  Administered 2016-12-06: 21:00:00 250 mL via INTRAVENOUS

## 2016-12-06 MED ORDER — SODIUM CHLORIDE 0.9% FLUSH: 10.0000 mL | INTRAVENOUS | Status: DC | PRN

## 2016-12-06 MED ORDER — HEPARIN SOD (PORK) LOCK FLUSH 100 UNIT/ML IV SOLN: 500.0000 [IU] | Freq: Every day | INTRAVENOUS | Status: DC | PRN

## 2016-12-06 MED ORDER — IOPAMIDOL (ISOVUE-300) INJECTION 61%
75.0000 mL | Freq: Once | INTRAVENOUS | Status: AC | PRN
Start: 2016-12-06 — End: 2016-12-06
  Administered 2016-12-06: 20:00:00 75 mL via INTRAVENOUS

## 2016-12-06 MED ORDER — FUROSEMIDE 10 MG/ML IJ SOLN
20.0000 mg | Freq: Once | INTRAMUSCULAR | Status: AC
  Administered 2016-12-06: 20 mg via INTRAVENOUS
  Filled 2016-12-06: qty 2

## 2016-12-06 MED ORDER — SODIUM CHLORIDE 0.9% FLUSH: 3.0000 mL | INTRAVENOUS | Status: DC | PRN

## 2016-12-06 NOTE — Plan of Care (Signed)
Problem: Education: Goal: Knowledge of [facility] Education information/materials will improve Outcome: Progressing Pt and daughter oriented to unit.  VS WDL, free of falls during shift.  No complaints overnight.  Call bell within reach, [place].

## 2016-12-06 NOTE — Progress Notes (Signed)
[facility] CONSULT NOTE  Patient Care Team: Einar Pheasant, MD as PCP - General (Internal Medicine) Einar Pheasant, MD (Internal Medicine) Robert Bellow, MD (General Surgery) Minna Merritts, MD as Consulting Physician (Cardiology)  CHIEF COMPLAINTS/PURPOSE OF CONSULTATION:   HISTORY OF PRESENTING ILLNESS:  [name] [name] 83 y.o.  female long-standing history of smoking recently quit approximately year ago; history of A. Fib [on Coumadin]- is currently admitted to the hospital for worsening shortness of breath cough. CT scan of the chest showed right-sided pleural effusion; bilateral large supraclavicular lymph nodes mediastinal adenopathy and right hilar mass. There is also concern of superior vena cava compression based on the limited imaging available. Oncology has been consulted for further recommendations and management.  Patient states to have a productive cough for the last many weeks. Not improving. No hemoptysis. No significant weight loss. Denies any headaches. Denies any chest pain. Denies any bone pain. Appetite is fair.  Patient had episode of fall few months ago.  She is awaiting to get an MRI of the brain as outpatient.  Patient lives by herself; she is fairly active for age.  She still drives and does the grocery shopping.   ROS: A complete 10 point review of system is done which is negative except mentioned above in history of present illness  MEDICAL HISTORY:  Past Medical History:  Diagnosis Date  . Atrial fibrillation ([place])   . Carotid stenosis   . Cholelithiasis   . Chronic kidney disease   . Chronic low back pain   . Gallstone pancreatitis 11-10-14  . Gallstones   . GERD (gastroesophageal reflux disease)   . Hyperlipidemia   . Hypertension   . Impaired fasting glucose   . Osteoarthritis     SURGICAL HISTORY: Past Surgical History:  Procedure Laterality Date  . APPENDECTOMY    . CATARACT EXTRACTION     x2  . CHOLECYSTECTOMY   11-10-14  . CHOLECYSTECTOMY      SOCIAL HISTORY: Long-standing smoking quit approximately.year ago. Used to work Surveyor, [employer] facility.  She lives by herself.   no alcohol.  Social History   Social History  . Marital status: Widowed    Spouse name: N/A  . Number of children: N/A  . Years of education: N/A   Occupational History  . Not on file.   Social History Main Topics  . Smoking status: Former Smoker    Packs/day: 0.50    Types: Cigarettes    Quit date: 11/04/2014  . Smokeless tobacco: Never Used     Comment: pt stopped two weeks ago  . Alcohol use 0.0 oz/week     Comment: occasional  . Drug use: No  . Sexual activity: Not on file   Other Topics Concern  . Not on file   Social History Narrative  . No narrative on file    FAMILY HISTORY: Family History  Problem Relation Age of Onset  . Heart attack Father 63  . Atrial fibrillation Sister   . Atrial fibrillation Brother   . Heart failure Sister 42  . Stroke Sister     ALLERGIES:  has No Known Allergies.  MEDICATIONS:  Current Facility-Administered Medications  Medication Dose Route Frequency Provider Last Rate Last Dose  . acetaminophen (TYLENOL) tablet 650 mg  650 mg Oral Q6H PRN Demetrios Loll, MD       Or  . acetaminophen (TYLENOL) suppository 650 mg  650 mg Rectal Q6H PRN Demetrios Loll, MD      .  albuterol (PROVENTIL) (2.5 MG/3ML) 0.083% nebulizer solution 2.5 mg  2.5 mg Nebulization Q2H PRN Demetrios Loll, MD      . bisacodyl (DULCOLAX) EC tablet 5 mg  5 mg Oral Daily PRN Demetrios Loll, MD      . carvedilol (COREG) tablet 12.5 mg  12.5 mg Oral BID WC Demetrios Loll, MD   12.5 mg at 12/06/16 1626  . clonazePAM (KLONOPIN) tablet 0.25 mg  0.25 mg Oral Daily PRN Demetrios Loll, MD   0.25 mg at 12/05/16 2133  . HYDROcodone-acetaminophen (NORCO/VICODIN) 5-325 MG per tablet 1-2 tablet  1-2 tablet Oral Q4H PRN Demetrios Loll, MD      . ketorolac (TORADOL) 15 MG/ML injection 15 mg  15 mg Intravenous Q6H PRN Demetrios Loll, MD      . losartan  (COZAAR) tablet 100 mg  100 mg Oral Daily Demetrios Loll, MD   100 mg at 12/06/16 0841  . ondansetron (ZOFRAN) tablet 4 mg  4 mg Oral Q6H PRN Demetrios Loll, MD       Or  . ondansetron [facility]) injection 4 mg  4 mg Intravenous Q6H PRN Demetrios Loll, MD      . pantoprazole (PROTONIX) EC tablet 40 mg  40 mg Oral Daily Demetrios Loll, MD   40 mg at 12/06/16 0840  . phytonadione (VITAMIN K) SQ injection 5 mg  5 mg Subcutaneous Q24 Hr x 2 Cammie Sickle, MD      . senna-docusate (Senokot-S) tablet 1 tablet  1 tablet Oral QHS PRN Demetrios Loll, MD          .  PHYSICAL EXAMINATION:  Vitals:   12/06/16 0600 12/06/16 1415  BP: 119/76 140/78  Pulse: 89 88  Resp: 20 18  Temp: 97.4 F (36.3 C) 97.6 F (36.4 C)   Filed Weights   12/05/16 1144  Weight: 155 lb (70.3 kg)    GENERAL: Well-nourished well-developed; Alert, no distress and comfortable. Accompanied by her daughter.  EYES: no pallor or icterus OROPHARYNX: no thrush or ulceration. NECK: supple.  LYMPH:   3-4 cm supraclavicular lymph nodes felt.  LUNGS: decreased breath sounds to auscultation on the right side compared to the left  at bases and  No wheeze or crackles HEART/CVS: regular rate & rhythm and no murmurs; No lower extremity edema ABDOMEN: abdomen soft, non-tender and normal bowel sounds Musculoskeletal:no cyanosis of digits and no clubbing  PSYCH: alert & oriented x 3 with fluent speech NEURO: no focal motor/sensory deficits SKIN:  no rashes or significant lesions; multiple chronic ecchymosis noted.  LABORATORY DATA:  I have reviewed the data as listed Lab Results  Component Value Date   WBC 4.4 12/05/2016   HGB 12.2 12/05/2016   HCT 34.0 (L) 12/05/2016   MCV 92.9 12/05/2016   PLT 131 (L) 12/05/2016    Recent Labs  03/23/16 0806 09/06/16 0807 12/05/16 1145 12/06/16 0547  NA 139 137 130* 130*  K 4.8 5.1 4.8 4.1  CL 104 102 98* 97*  CO2 '29 28 26 25  '$ GLUCOSE 111* 143* 131* 103*  BUN '13 16 12 10  '$ CREATININE 0.90 1.09  1.02* 0.76  CALCIUM 10.2 10.3 9.5 9.1  GFRNONAA  --   --  48* >60  GFRAA  --   --  56* >60  PROT 6.4 7.0 6.8  --   ALBUMIN 4.2 4.2 3.8  --   AST 19 15 32  --   ALT '15 14 14  '$ --   ALKPHOS 55 116 67  --  BILITOT 1.4* 1.1 1.2  --   BILIDIR 0.3 0.2  --   --     RADIOGRAPHIC STUDIES: I have personally reviewed the radiological images as listed and agreed with the findings in the report. Dg Chest 2 View  Result Date: 12/05/2016 CLINICAL DATA:  One month of shortness of breath. History of atrial fibrillation, aortic stenosis, discontinued smoking 2 years ago. EXAM: CHEST  2 VIEW COMPARISON:  PA and lateral chest x-ray of November 17, 2016 FINDINGS: Since the previous study these right-sided pleural effusion has increased in volume. The right hemidiaphragm is obscured. The left lung is well-expanded. There is a trace of pleural fluid on the left. The heart is top-normal in size. The pulmonary vascularity is mildly prominent. There is soft tissue fullness in the right hilar region more conspicuous than in the past. There is mitral annular calcification. There is calcification in the wall of the thoracic aorta. IMPRESSION: COPD. Increased volume of the right-sided pleural effusion which now occupies approximately 1/3 of the pleural space volume. This may be obscuring a basilar pneumonia or neoplasm. Persistent soft tissue fullness in the right hilar region new since December 2015. Mild CHF.  Thoracic aortic atherosclerosis. Chest CT scanning is recommended to further evaluate the right hilum and right lower hemithorax. Electronically Signed   By: David  Martinique M.D.   On: 12/05/2016 12:42   Dg Chest 2 View  Result Date: 11/17/2016 CLINICAL DATA:  Wheezing EXAM: CHEST  2 VIEW COMPARISON:  11/10/2014 FINDINGS: Bilateral interstitial thickening. Right lower lobe airspace disease. Small right pleural effusion. No left pleural effusion. No pneumothorax. Stable cardiomediastinal silhouette. Thoracic aortic  atherosclerosis. No acute osseous abnormality. IMPRESSION: Small right pleural effusion and right lower lobe airspace disease concerning for atelectasis versus pneumonia. Electronically Signed   By: Kathreen Devoid   On: 11/17/2016 12:39   Ct Chest W Contrast  Result Date: 12/05/2016 CLINICAL DATA:  Plain films demonstrating right-sided airspace disease and suggestion of right hilar soft tissue fullness. Shortness of breath for 1 month. EXAM: CT CHEST WITH CONTRAST TECHNIQUE: Multidetector CT imaging of the chest was performed during intravenous contrast administration. CONTRAST:  86m ISOVUE-300 IOPAMIDOL (ISOVUE-300) INJECTION 61% COMPARISON:  Multiple plain films, most recent 12/05/2016. No prior CTs for comparison. FINDINGS: Cardiovascular: Advanced aortic and branch vessel atherosclerosis. Moderate cardiomegaly with no pericardial effusion. Multivessel coronary artery atherosclerosis. No central pulmonary embolism, on this non-dedicated study. Narrowing of the confluence of the brachiocephalic veins and less so the superior aspect of the SVC. Example image 43/series 2. Mediastinum/Nodes: Massive bilateral supraclavicular adenopathy, including at 3.2 cm in the left supraclavicular space. Marked mediastinal adenopathy, with a right paratracheal node measuring 3.4 cm on image 54/series 2. Subcarinal node measures 2.5 cm on image 75/series 2. Right hilar adenopathy at 2.5 cm. Moderate hiatal hernia.  Anterior mediastinal nodal mass at 2.6 cm. Lungs/Pleura: Small to moderate right-sided pleural effusion, without well-defined pleural mass Minimal motion degradation. Patent airways with mass-effect upon right lower and middle lobe bronchi. Right middle lobe partial collapse. Dependent right lower lobe airspace disease. Soft tissue mass centered at the lateral aspect of the origin of the right middle lobe bronchus could be pulmonary parenchymal or nodal. Measures 2.9 x 3.9 cm on image 79/series 2. Upper Abdomen:  Cholecystectomy. Pneumobilia. Hypoattenuating lateral segment left liver lobe lesion is 1.5 cm and represent a cyst or complex cyst on image 129/series 2. Heterogeneous enhancement involving the remainder of the liver is subtle. Normal imaged portions of the spleen,  pancreas, kidneys. Normal imaged adrenal glands. Abdominal aortic atherosclerosis. No upper abdominal adenopathy. Musculoskeletal: Left axillary adenopathy, include at 1.6 cm on image 33/series 2. Osteopenia. Mildly heterogeneous marrow density, especially involving the left side of T8 vertebral body. IMPRESSION: 1. Massive adenopathy in the low neck and chest, most consistent with neoplasm. Considerations include small-cell lung cancer or lymphoma. The only potential side of primary in the lungs is an area of soft tissue fullness in the right infrahilar region, just lateral to the origin of the right middle lobe bronchus. This could represent the primary pulmonary mass or adenopathy. 2. Right-sided pleural effusion with right middle lobe partial collapse 3. Heterogeneous hepatic density is nonspecific. Metastatic disease cannot be excluded. The largest hypoattenuating area is relatively low density and could represent a cyst or complex cyst. Recommend attention on follow-up. 4. Probable osseous metastasis, most apparent at T8. 5. Consider further evaluation with nonemergent PET and/or thoracic surgery/oncology consultation. 6. Given the extent of upper thoracic adenopathy, the patient may be predisposed to SVC syndrome. Electronically Signed   By: Abigail Miyamoto M.D.   On: 12/05/2016 17:38    ASSESSMENT & PLAN:   # 83 year old female patient currently admitted to hospital for worsening shortness of breath cough- CT scan-  Bilateral supraclavicular adenopathy; mediastinal adenopathy; right pleural effusion.  # bilateral supraclavicular adenopathy/ mediastinal adenopathy/right pleural effusion- almost certainly  Concerning for malignancy. High  suspicion for small cell lung cancer given the pattern of spread. However lymphoma  Has to be ruled out. Long discussion the patient/daughter regarding the concerns. They're interested in a biopsy. I think neck adenopathy should be accessible to biopsy. I would also recommend CT of the brain/CT of the neck given the concerns for SVC syndrome. Patient not  Very keen on chemotherapy; however interested in knowing treatment options post biopsy. Discussed with patient/daughter that the medial left expectancy of small cell lung cancer- is in the range of 6-12 months.    # right-sided pleural effusion- recommended  Therapeutic/diagnostic thoracentesis.  # coagulopathy from Coumadin- I recommend vitamin K. And also FFP- in anticipation for  Lymph node biopsy tomorrow.   # A. Fib on Coumadin- in the larger context of her incurable malignancy. I think Coumadin discontinued. Defer to cardiology/ Dr.Gollan for further recommendations.  # the above plan of care was discussed with the patient and daughter in detail. # I reviewed the blood work- with the patient in detail; also reviewed the imaging independently [as summarized above]; and with the patient in detail.   Thank you Dr. Posey Pronto for allowing me to participate in the care of your pleasant patient. Please do not hesitate to contact me with questions or concerns in the interim.  # 60 minutes face-to-face with the patient discussing the above plan of care; more than 50% of time spent on prognosis/ natural history; counseling and coordination.     Cammie Sickle, MD 12/06/2016 7:35 PM

## 2016-12-06 NOTE — Progress Notes (Signed)
ANTICOAGULATION CONSULT NOTE - Follow Up Consult  Pharmacy Consult for Warfarin Indication: atrial fibrillation  No Known Allergies  Patient Measurements: Height: '5\' 6"'$  (167.6 cm) Weight: 155 lb (70.3 kg) IBW/kg (Calculated) : 59.3 Heparin Dosing Weight:    Vital Signs: Temp: 97.4 F (36.3 C) (01/24 0600) Temp Source: Oral (01/24 0600) BP: 119/76 (01/24 0600) Pulse Rate: 89 (01/24 0600)  Labs:  Recent Labs  12/05/16 1145 12/05/16 1657 12/06/16 0547  HGB  --  12.2  --   HCT  --  34.0*  --   PLT  --  131*  --   APTT  --  50*  --   LABPROT  --  35.9* 34.9*  INR  --  3.49 3.37  CREATININE 1.02*  --  0.76  TROPONINI  --  <0.03  --     Estimated Creatinine Clearance: 47.3 mL/min (by C-G formula based on SCr of 0.76 mg/dL).   Medications:  Prescriptions Prior to Admission  Medication Sig Dispense Refill Last Dose  . albuterol (PROVENTIL HFA;VENTOLIN HFA) 108 (90 Base) MCG/ACT inhaler Inhale 2 puffs into the lungs every 6 (six) hours as needed for wheezing or shortness of breath. 1 Inhaler 3 prn at prn  . carvedilol (COREG) 25 MG tablet TAKE 1 TABLET (25 MG TOTAL) BY MOUTH 2 (TWO) TIMES DAILY WITH A MEAL. (Patient taking differently: TAKE 1/2 TABLET (12.5 MG TOTAL) BY MOUTH 2 (TWO) TIMES DAILY WITH A MEAL.) 180 tablet 1 12/05/2016 at 0900  . clonazePAM (KLONOPIN) 0.5 MG tablet TAKE 1/2 TO 1 TABLET BY MOUTH ONCE DAILY AS NEEDED 30 tablet 0 prn at prn  . hydrALAZINE (APRESOLINE) 10 MG tablet TAKE 1 TABLET BY MOUTH 3 TIMES A DAY 90 tablet 5 Past Week at Unknown time  . losartan (COZAAR) 100 MG tablet TAKE 1 TABLET BY MOUTH DAILY. 90 tablet 3 12/05/2016 at 0900  . omeprazole (PRILOSEC) 20 MG capsule Take 20 mg by mouth daily.   12/05/2016 at 0900  . pravastatin (PRAVACHOL) 40 MG tablet TAKE 1 TABLET (40 MG TOTAL) BY MOUTH DAILY. 90 tablet 3 12/05/2016 at 0900  . warfarin (COUMADIN) 5 MG tablet TAKE AS DIRECTED BY ANTICOAGULATION CLINIC (Patient taking differently: 1/2 tablet daily)  30 tablet 3 12/04/2016 at 2000  . warfarin (COUMADIN) 5 MG tablet Take 2.5 mg by mouth daily.   12/04/2016 at 2000  . azithromycin (ZITHROMAX) 250 MG tablet Take two tablets x 1 day and then one tablet per day for four more days. (Patient not taking: Reported on 12/05/2016) 6 tablet 0 Not Taking at Unknown time    Assessment: Pt is a 83 year old female with lung cancer and afbi on warfarin at home. Pt INR on admission is 3.49. Home dose is 2.'5mg'$  daily. Of note INR was also high on 1/3 at anticoag visit and was instructed to hold warfarin x 1 and then resume dose. Pt on azithromycin from 1/8-1/12. Per MD note patient may have a biopsy or thoracentesis.  1/23 INR 3.49, no warfarin 1/24 INR 3.37  Goal of Therapy:  INR 2-3   Plan:  Hold warfarin dose today due to elevated INR and possible need for biopsy or thoracentesis. Continue with daily INR checks.  Paulina Fusi, PharmD, BCPS 12/06/2016 11:00 AM

## 2016-12-06 NOTE — Consult Note (Signed)
Consultation Note Date: 12/06/2016   Patient Name: [name] [name]  DOB: 1928-11-01  MRN: [id number]  Age / Sex: 83 y.o., female  PCP: [name] Pheasant, MD Referring Physician: Fritzi Mandes, MD  Reason for Consultation: Establishing goals of care and Psychosocial/spiritual support  HPI/Patient Profile: 83 y.o. female   admitted on 12/05/2016 with a known history of A. fib, carotid stenosis, CKD and hypertension. The patient has had exertional dyspnea for several months, which has been worsening progressively for the past a few weeks. She developed severe dry cough and was treated 10 with Z-Pak 10 days ago without improvement. At that time, the chest x-ray showed abnormality in the right lung. She had severe dyspnea on exertion yesterday. CAT scan of the chest show lung mass, right pleural effusion and lymphadenopathy.  Patient awaits input from oncology, she faces treatment options decisions, advanced directive decisions and anticipatory care needs.  Clinical Assessment and Goals of Care:   This NP [name] [name] reviewed medical records, received report from team, assessed the patient and then meet at the patient's bedside along with her daughter [name] [name] to introduce the role of the palliative medicine team into a holistic plan of care for this patient.    A  discussion was had today regarding advanced directives.  Concepts specific to code status, artifical feeding was had.  The difference between a aggressive medical intervention path  and a palliative comfort care path for this patient at this time was had.  Values and goals of care important to patient and family were attempted to be elicited.  Concept of Hospice and Palliative Care were discussed   Questions and concerns addressed.   Family encouraged to call with questions or concerns.  PMT will continue to support holistically and f/u in the  morning.     NEXT OF KIN/two daughters/ main support and decision maker if patient does not have capacity    SUMMARY OF RECOMMENDATIONS    Code Status/Advance Care Planning:  Full code- only until patient gets information from oncology and daughter from [place] arrives in town.. Pateitn verbalizes no desire for life prolonging interventions if comfort and quality are not the intention.    Palliative Prophylaxis:   Aspiration, Bowel Regimen, Delirium Protocol, Frequent Pain Assessment and Oral Care  Additional Recommendations (Limitations, Scope, Preferences):  At this time patient awaits consult with oncology.  She is very realistic and pragmatic.  She understands that she likely faces many hard decisions.  Her main goal is quality of life.    Psycho-social/Spiritual:   Desire for further Chaplaincy support:no    Prognosis:   Unable to determine  Discharge Planning: To Be Determined      Primary Diagnoses: Present on Admission: . Lung cancer, primary, with metastasis from lung to other site [facility])   I have reviewed the medical record, interviewed the patient and family, and examined the patient. The following aspects are pertinent.  Past Medical History:  Diagnosis Date  . Atrial fibrillation ([place])   . Carotid stenosis   .  Cholelithiasis   . Chronic kidney disease   . Chronic low back pain   . Gallstone pancreatitis 11-10-14  . Gallstones   . GERD (gastroesophageal reflux disease)   . Hyperlipidemia   . Hypertension   . Impaired fasting glucose   . Osteoarthritis    Social History   Social History  . Marital status: Widowed    Spouse name: N/A  . Number of children: N/A  . Years of education: N/A   Social History Main Topics  . Smoking status: Former Smoker    Packs/day: 0.50    Types: Cigarettes    Quit date: 11/04/2014  . Smokeless tobacco: Never Used     Comment: pt stopped two weeks ago  . Alcohol use 0.0 oz/week     Comment:  occasional  . Drug use: No  . Sexual activity: Not Asked   Other Topics Concern  . None   Social History Narrative  . None   Family History  Problem Relation Age of Onset  . Heart attack Father 68  . Atrial fibrillation Sister   . Atrial fibrillation Brother   . Heart failure Sister 36  . Stroke Sister    Scheduled Meds: . carvedilol  12.5 mg Oral BID WC  . losartan  100 mg Oral Daily  . pantoprazole  40 mg Oral Daily   Continuous Infusions: . sodium chloride 75 mL/hr at 12/06/16 1304   PRN Meds:.acetaminophen **OR** acetaminophen, albuterol, bisacodyl, clonazePAM, HYDROcodone-acetaminophen, ketorolac, ondansetron **OR** ondansetron (ZOFRAN) IV, senna-docusate Medications Prior to Admission:  Prior to Admission medications   Medication Sig Start Date End Date Taking? Authorizing Provider  albuterol (PROVENTIL HFA;VENTOLIN HFA) 108 (90 Base) MCG/ACT inhaler Inhale 2 puffs into the lungs every 6 (six) hours as needed for wheezing or shortness of breath. 09/07/16  Yes Minna Merritts, MD  carvedilol (COREG) 25 MG tablet TAKE 1 TABLET (25 MG TOTAL) BY MOUTH 2 (TWO) TIMES DAILY WITH A MEAL. Patient taking differently: TAKE 1/2 TABLET (12.5 MG TOTAL) BY MOUTH 2 (TWO) TIMES DAILY WITH A MEAL. 05/23/16  Yes Crecencio Mc, MD  clonazePAM (KLONOPIN) 0.5 MG tablet TAKE 1/2 TO 1 TABLET BY MOUTH ONCE DAILY AS NEEDED 11/14/16  Yes [name] Pheasant, MD  hydrALAZINE (APRESOLINE) 10 MG tablet TAKE 1 TABLET BY MOUTH 3 TIMES A DAY 03/31/16  Yes [name] Pheasant, MD  losartan (COZAAR) 100 MG tablet TAKE 1 TABLET BY MOUTH DAILY. 04/12/16  Yes [name] Pheasant, MD  omeprazole (PRILOSEC) 20 MG capsule Take 20 mg by mouth daily.   Yes Historical Provider, MD  pravastatin (PRAVACHOL) 40 MG tablet TAKE 1 TABLET (40 MG TOTAL) BY MOUTH DAILY. 12/03/15  Yes Minna Merritts, MD  warfarin (COUMADIN) 5 MG tablet TAKE AS DIRECTED BY ANTICOAGULATION CLINIC Patient taking differently: 1/2 tablet daily 10/09/16  Yes  Minna Merritts, MD  warfarin (COUMADIN) 5 MG tablet Take 2.5 mg by mouth daily.   Yes Historical Provider, MD  azithromycin (ZITHROMAX) 250 MG tablet Take two tablets x 1 day and then one tablet per day for four more days. Patient not taking: Reported on 12/05/2016 11/20/16   [name] Pheasant, MD   No Known Allergies Review of Systems  Constitutional: Positive for activity change, appetite change and fatigue.  Respiratory: Positive for shortness of breath.   Neurological: Positive for weakness.    Physical Exam  Constitutional: She is oriented to person, place, and time. She appears well-developed. Nasal cannula in place.  HENT:  Mouth/Throat: Oropharynx is  clear and moist.  Cardiovascular: Normal rate, regular rhythm and normal heart sounds.   Pulmonary/Chest: She has decreased breath sounds in the right lower field and the left lower field.  Musculoskeletal:  -generalized weakness  Neurological: She is alert and oriented to person, place, and time.  Skin: Skin is warm and dry.    Vital Signs: BP 119/76   Pulse 89   Temp 97.4 F (36.3 C) (Oral)   Resp 20   Ht '5\' 6"'$  (1.676 m)   Wt 70.3 kg (155 lb)   SpO2 92%   BMI 25.02 kg/m  Pain Assessment: 0-10   Pain Score: 0-No pain   SpO2: SpO2: 92 % O2 Device:SpO2: 92 % O2 Flow Rate: .   IO: Intake/output summary:  Intake/Output Summary (Last 24 hours) at 12/06/16 1415 Last data filed at 12/06/16 0800  Gross per 24 hour  Intake              720 ml  Output                0 ml  Net              720 ml    LBM: Last BM Date: 12/05/16 Baseline Weight: Weight: 70.3 kg (155 lb) Most recent weight: Weight: 70.3 kg (155 lb)      Palliative Assessment/Data: 60%   Discussed with dr Posey Pronto  Time In: 1345 Time Out: 1500 Time Total: 75 min Greater than 50%  of this time was spent counseling and coordinating care related to the above assessment and plan.  Signed by: [name] Lessen, NP   Please contact Palliative Medicine Team  phone at [phone] for questions and concerns.  For individual provider: See Shea Evans

## 2016-12-06 NOTE — Progress Notes (Signed)
[facility] NAME: [name] [name]    MR#:  [id number]  DATE OF BIRTH:  November 25, 1927  SUBJECTIVE:  Came in with increasing sob and weakness. No wheezing Daughter in the room  REVIEW OF SYSTEMS:   Review of Systems  Constitutional: Positive for weight loss. Negative for chills and fever.  HENT: Negative for ear discharge, ear pain and nosebleeds.   Eyes: Negative for blurred vision, pain and discharge.  Respiratory: Positive for sputum production and shortness of breath. Negative for wheezing and stridor.   Cardiovascular: Negative for chest pain, palpitations, orthopnea and PND.  Gastrointestinal: Negative for abdominal pain, diarrhea, nausea and vomiting.  Genitourinary: Negative for frequency and urgency.  Musculoskeletal: Negative for back pain and joint pain.  Neurological: Positive for weakness. Negative for sensory change, speech change and focal weakness.  Psychiatric/Behavioral: Negative for depression and hallucinations. The patient is not nervous/anxious.    Tolerating Diet:yes Tolerating PT:   DRUG ALLERGIES:  No Known Allergies  VITALS:  Blood pressure 140/78, pulse 88, temperature 97.6 F (36.4 C), temperature source Oral, resp. rate 18, height '5\' 6"'$  (1.676 m), weight 70.3 kg (155 lb), SpO2 96 %.  PHYSICAL EXAMINATION:   Physical Exam  GENERAL:  83 y.o.-year-old patient lying in the bed with no acute distress.  EYES: Pupils equal, round, reactive to light and accommodation. No scleral icterus. Extraocular muscles intact.  HEENT: Head atraumatic, normocephalic. Oropharynx and nasopharynx clear.  NECK:  Supple, no jugular venous distention. No thyroid enlargement, no tenderness.  LUNGS: distant breath sounds bilaterally, no wheezing, rales, rhonchi. No use of accessory muscles of respiration.  CARDIOVASCULAR: S1, S2 normal. No murmurs, rubs, or gallops.  ABDOMEN: Soft, nontender, nondistended. Bowel  sounds present. No organomegaly or mass.  EXTREMITIES: No cyanosis, clubbing or edema b/l.    NEUROLOGIC: Cranial nerves II through XII are intact. No focal Motor or sensory deficits b/l.   PSYCHIATRIC:  patient is alert and oriented x 3.  SKIN: No obvious rash, lesion, or ulcer.   LABORATORY PANEL:  CBC  Recent Labs Lab 12/05/16 1657  WBC 4.4  HGB 12.2  HCT 34.0*  PLT 131*    Chemistries   Recent Labs Lab 12/05/16 1145 12/06/16 0547  NA 130* 130*  K 4.8 4.1  CL 98* 97*  CO2 26 25  GLUCOSE 131* 103*  BUN 12 10  CREATININE 1.02* 0.76  CALCIUM 9.5 9.1  AST 32  --   ALT 14  --   ALKPHOS 67  --   BILITOT 1.2  --    Cardiac Enzymes  Recent Labs Lab 12/05/16 1657  TROPONINI <0.03   RADIOLOGY:  Dg Chest 2 View  Result Date: 12/05/2016 CLINICAL DATA:  One month of shortness of breath. History of atrial fibrillation, aortic stenosis, discontinued smoking 2 years ago. EXAM: CHEST  2 VIEW COMPARISON:  PA and lateral chest x-ray of November 17, 2016 FINDINGS: Since the previous study these right-sided pleural effusion has increased in volume. The right hemidiaphragm is obscured. The left lung is well-expanded. There is a trace of pleural fluid on the left. The heart is top-normal in size. The pulmonary vascularity is mildly prominent. There is soft tissue fullness in the right hilar region more conspicuous than in the past. There is mitral annular calcification. There is calcification in the wall of the thoracic aorta. IMPRESSION: COPD. Increased volume of the right-sided pleural effusion which now occupies approximately 1/3 of the pleural space  volume. This may be obscuring a basilar pneumonia or neoplasm. Persistent soft tissue fullness in the right hilar region new since December 2015. Mild CHF.  Thoracic aortic atherosclerosis. Chest CT scanning is recommended to further evaluate the right hilum and right lower hemithorax. Electronically Signed   By: David  Martinique M.D.   On:  12/05/2016 12:42   Ct Chest W Contrast  Result Date: 12/05/2016 CLINICAL DATA:  Plain films demonstrating right-sided airspace disease and suggestion of right hilar soft tissue fullness. Shortness of breath for 1 month. EXAM: CT CHEST WITH CONTRAST TECHNIQUE: Multidetector CT imaging of the chest was performed during intravenous contrast administration. CONTRAST:  68m ISOVUE-300 IOPAMIDOL (ISOVUE-300) INJECTION 61% COMPARISON:  Multiple plain films, most recent 12/05/2016. No prior CTs for comparison. FINDINGS: Cardiovascular: Advanced aortic and branch vessel atherosclerosis. Moderate cardiomegaly with no pericardial effusion. Multivessel coronary artery atherosclerosis. No central pulmonary embolism, on this non-dedicated study. Narrowing of the confluence of the brachiocephalic veins and less so the superior aspect of the SVC. Example image 43/series 2. Mediastinum/Nodes: Massive bilateral supraclavicular adenopathy, including at 3.2 cm in the left supraclavicular space. Marked mediastinal adenopathy, with a right paratracheal node measuring 3.4 cm on image 54/series 2. Subcarinal node measures 2.5 cm on image 75/series 2. Right hilar adenopathy at 2.5 cm. Moderate hiatal hernia.  Anterior mediastinal nodal mass at 2.6 cm. Lungs/Pleura: Small to moderate right-sided pleural effusion, without well-defined pleural mass Minimal motion degradation. Patent airways with mass-effect upon right lower and middle lobe bronchi. Right middle lobe partial collapse. Dependent right lower lobe airspace disease. Soft tissue mass centered at the lateral aspect of the origin of the right middle lobe bronchus could be pulmonary parenchymal or nodal. Measures 2.9 x 3.9 cm on image 79/series 2. Upper Abdomen: Cholecystectomy. Pneumobilia. Hypoattenuating lateral segment left liver lobe lesion is 1.5 cm and represent a cyst or complex cyst on image 129/series 2. Heterogeneous enhancement involving the remainder of the liver is  subtle. Normal imaged portions of the spleen, pancreas, kidneys. Normal imaged adrenal glands. Abdominal aortic atherosclerosis. No upper abdominal adenopathy. Musculoskeletal: Left axillary adenopathy, include at 1.6 cm on image 33/series 2. Osteopenia. Mildly heterogeneous marrow density, especially involving the left side of T8 vertebral body. IMPRESSION: 1. Massive adenopathy in the low neck and chest, most consistent with neoplasm. Considerations include small-cell lung cancer or lymphoma. The only potential side of primary in the lungs is an area of soft tissue fullness in the right infrahilar region, just lateral to the origin of the right middle lobe bronchus. This could represent the primary pulmonary mass or adenopathy. 2. Right-sided pleural effusion with right middle lobe partial collapse 3. Heterogeneous hepatic density is nonspecific. Metastatic disease cannot be excluded. The largest hypoattenuating area is relatively low density and could represent a cyst or complex cyst. Recommend attention on follow-up. 4. Probable osseous metastasis, most apparent at T8. 5. Consider further evaluation with nonemergent PET and/or thoracic surgery/oncology consultation. 6. Given the extent of upper thoracic adenopathy, the patient may be predisposed to SVC syndrome. Electronically Signed   By: KAbigail MiyamotoM.D.   On: 12/05/2016 17:38   ASSESSMENT AND PLAN:  RRosalva [name] is a 83y.o. female with a known history of A. fib, carotid stenosis, CKD and hypertension. The patient has had exertional dyspnea for several months, which has been worsening progressively for the past a few weeks. She developed severe dry cough and was treated 10 with Z-Pak 10 days ago without improvement   *New diagnosis  of Lung mass  With possible  metastasis to liver and T8 and right-sided pleural effusion - Follow-up oncology consult for further recommendation. -hold coumadin for biopsy or pleural tap -Dr Rogue Bussing  consulted -h/o heavy tobacco abuse -sats 96% on Ra -?w/u as out pt -pt was just treated with zithromax. I am holding off abxs for now  * Hyponatremia. Start normal saline IV and follow-up BMP. -? SIADH with lung mass  *Chronic A. Fib. Hold Coumadin for possible biopsy or thoracentesis and follow-up INR. Continue Coreg.  *Hypertension. Continue hypertension medication  Case discussed with Care Management/Social Worker. Management plans discussed with the patient, family and they are in agreement.  CODE STATUS: full  DVT Prophylaxis: SCD TOTAL TIME TAKING CARE OF THIS PATIENT: 25 minutes.  >50% time spent on counselling and coordination of care  POSSIBLE D/C IN 1-2 DAYS, DEPENDING ON CLINICAL CONDITION.  Note: This dictation was prepared with Dragon dictation along with smaller phrase technology. Any transcriptional errors that result from this process are unintentional.  Elda Dunkerson M.D on 12/06/2016 at 4:55 PM  Between 7am to 6pm - Pager - [phone]  After 6pm go to [email or web address] - password [email or web address] [facility]  Office  [phone]  CC: Primary care physician; Einar Pheasant, MD

## 2016-12-07 ENCOUNTER — Inpatient Hospital Stay: Payer: Medicare Other

## 2016-12-07 DIAGNOSIS — J90 Pleural effusion, not elsewhere classified: Secondary | ICD-10-CM

## 2016-12-07 DIAGNOSIS — R918 Other nonspecific abnormal finding of lung field: Secondary | ICD-10-CM

## 2016-12-07 LAB — BASIC METABOLIC PANEL WITH GFR
Anion gap: 8 (ref 5–15)
BUN: 9 mg/dL (ref 6–20)
CO2: 28 mmol/L (ref 22–32)
Calcium: 9.2 mg/dL (ref 8.9–10.3)
Chloride: 94 mmol/L — ABNORMAL LOW (ref 101–111)
Creatinine, Ser: 0.81 mg/dL (ref 0.44–1.00)
GFR calc Af Amer: >60 mL/min
GFR calc non Af Amer: >60 mL/min
Glucose, Bld: 100 mg/dL — ABNORMAL HIGH (ref 65–99)
Potassium: 3.6 mmol/L (ref 3.5–5.1)
Sodium: 130 mmol/L — ABNORMAL LOW (ref 135–145)

## 2016-12-07 LAB — CBC
HCT: 30 % — ABNORMAL LOW (ref 35.0–47.0)
Hemoglobin: 10.9 g/dL — ABNORMAL LOW (ref 12.0–16.0)
MCH: 33.4 pg (ref 26.0–34.0)
MCHC: 36.4 g/dL — ABNORMAL HIGH (ref 32.0–36.0)
MCV: 91.8 fL (ref 80.0–100.0)
Platelets: 115 10*3/uL — ABNORMAL LOW (ref 150–440)
RBC: 3.27 MIL/uL — ABNORMAL LOW (ref 3.80–5.20)
RDW: 12.4 % (ref 11.5–14.5)
WBC: 3.7 10*3/uL (ref 3.6–11.0)

## 2016-12-07 LAB — PROTIME-INR
INR: 1.58
Prothrombin Time: 19 seconds — ABNORMAL HIGH (ref 11.4–15.2)

## 2016-12-07 LAB — BODY FLUID CELL COUNT WITH DIFFERENTIAL
Eos, Fluid: 0 %
Lymphs, Fluid: 92 %
Monocyte-Macrophage-Serous Fluid: 6 %
Neutrophil Count, Fluid: 2 %
Other Cells, Fluid: 0 %
Total Nucleated Cell Count, Fluid: 336 uL

## 2016-12-07 LAB — LACTATE DEHYDROGENASE, PLEURAL OR PERITONEAL FLUID: LD, Fluid: 106 U/L — ABNORMAL HIGH (ref 3–23)

## 2016-12-07 LAB — PROTEIN, BODY FLUID: Total protein, fluid: <3 g/dL

## 2016-12-07 LAB — GLUCOSE, SEROUS FLUID: GLUCOSE FL: 132 mg/dL

## 2016-12-07 MED ORDER — CALCIUM CARBONATE ANTACID 500 MG PO CHEW
1.0000 | CHEWABLE_TABLET | Freq: Three times a day (TID) | ORAL | Status: DC | PRN
  Administered 2016-12-07: 20:00:00 200 mg via ORAL
  Filled 2016-12-07: qty 1

## 2016-12-07 MED ORDER — WARFARIN SODIUM 3 MG PO TABS
3.0000 mg | ORAL_TABLET | Freq: Once | ORAL | Status: AC
  Administered 2016-12-07: 19:00:00 3 mg via ORAL
  Filled 2016-12-07: qty 1

## 2016-12-07 MED ORDER — WARFARIN - PHARMACIST DOSING INPATIENT
Freq: Every day | Status: DC
  Administered 2016-12-07 – 2016-12-08 (×2)

## 2016-12-07 MED ORDER — LORAZEPAM 1 MG PO TABS
1.0000 mg | ORAL_TABLET | ORAL | Status: AC
  Administered 2016-12-07: 1 mg via ORAL
  Filled 2016-12-07: qty 1

## 2016-12-07 NOTE — Care Management Important Message (Signed)
Important Message  Patient Details  Name: [name] [name] MRN: [id number] Date of Birth: September 16, 1928   Medicare Important Message Given:  Yes    Shelbie Ammons, RN 12/07/2016, 8:36 AM

## 2016-12-07 NOTE — Progress Notes (Signed)
Pt.'s Daughter requested copies of information signed by pt. Prior to shift. Obtained pt. Verbal consent to discuss pt. Information with daughter in room. Explained to daughter need to contact HIM/ medical records office to obtain any copies of EMR.

## 2016-12-07 NOTE — Progress Notes (Signed)
Vitals taken for 2 pm BP noted to be lower than in previous readings., recheck manually. Pt resting quietly no respiratory distress, primary nurse covering pt. At this time, Malka RN made aware.

## 2016-12-07 NOTE — Progress Notes (Signed)
ANTICOAGULATION CONSULT NOTE - Follow Up Consult  Pharmacy Consult for Warfarin Indication: atrial fibrillation  No Known Allergies  Patient Measurements: Height: '5\' 6"'$  (167.6 cm) Weight: 155 lb (70.3 kg) IBW/kg (Calculated) : 59.3 Heparin Dosing Weight:    Vital Signs: Temp: 98.3 F (36.8 C) (01/25 0501) Temp Source: Oral (01/25 0501) BP: 117/87 (01/25 0501) Pulse Rate: 104 (01/25 0501)  Labs:  Recent Labs  12/05/16 1145 12/05/16 1657 12/06/16 0547 12/07/16 0518  HGB  --  12.2  --  10.9*  HCT  --  34.0*  --  30.0*  PLT  --  131*  --  115*  APTT  --  50*  --   --   LABPROT  --  35.9* 34.9* 19.0*  INR  --  3.49 3.37 1.58  CREATININE 1.02*  --  0.76 0.81  TROPONINI  --  <0.03  --   --     Estimated Creatinine Clearance: 46.7 mL/min (by C-G formula based on SCr of 0.81 mg/dL).   Medications:  Prescriptions Prior to Admission  Medication Sig Dispense Refill Last Dose  . albuterol (PROVENTIL HFA;VENTOLIN HFA) 108 (90 Base) MCG/ACT inhaler Inhale 2 puffs into the lungs every 6 (six) hours as needed for wheezing or shortness of breath. 1 Inhaler 3 prn at prn  . carvedilol (COREG) 25 MG tablet TAKE 1 TABLET (25 MG TOTAL) BY MOUTH 2 (TWO) TIMES DAILY WITH A MEAL. (Patient taking differently: TAKE 1/2 TABLET (12.5 MG TOTAL) BY MOUTH 2 (TWO) TIMES DAILY WITH A MEAL.) 180 tablet 1 12/05/2016 at 0900  . clonazePAM (KLONOPIN) 0.5 MG tablet TAKE 1/2 TO 1 TABLET BY MOUTH ONCE DAILY AS NEEDED 30 tablet 0 prn at prn  . hydrALAZINE (APRESOLINE) 10 MG tablet TAKE 1 TABLET BY MOUTH 3 TIMES A DAY 90 tablet 5 Past Week at Unknown time  . losartan (COZAAR) 100 MG tablet TAKE 1 TABLET BY MOUTH DAILY. 90 tablet 3 12/05/2016 at 0900  . omeprazole (PRILOSEC) 20 MG capsule Take 20 mg by mouth daily.   12/05/2016 at 0900  . pravastatin (PRAVACHOL) 40 MG tablet TAKE 1 TABLET (40 MG TOTAL) BY MOUTH DAILY. 90 tablet 3 12/05/2016 at 0900  . warfarin (COUMADIN) 5 MG tablet TAKE AS DIRECTED BY  ANTICOAGULATION CLINIC (Patient taking differently: 1/2 tablet daily) 30 tablet 3 12/04/2016 at 2000  . warfarin (COUMADIN) 5 MG tablet Take 2.5 mg by mouth daily.   12/04/2016 at 2000  . azithromycin (ZITHROMAX) 250 MG tablet Take two tablets x 1 day and then one tablet per day for four more days. (Patient not taking: Reported on 12/05/2016) 6 tablet 0 Not Taking at Unknown time    Assessment: Pt is a 83 year old female with lung cancer and afbi on warfarin at home. Pt INR on admission is 3.49. Home dose is 2.'5mg'$  daily. Of note INR was also high on 1/3 at anticoag visit and was instructed to hold warfarin x 1 and then resume dose. Pt on azithromycin from 1/8-1/12. Per MD note patient may have a biopsy or thoracentesis.  1/23 INR 3.49, no warfarin 1/24 INR 3.37; no warfarin  1/25: INR: 1.58   Goal of Therapy:  INR 2-3   Plan:  Will give warfarin 3 mg PO x 1. Will recheck INR in am.   Larene Beach, PharmD  12/07/2016 10:47 AM

## 2016-12-07 NOTE — Progress Notes (Signed)
   This NP  Meet at bedside with patient and her daughter as a followed up to [employer]  Patient outlines her plan as thoracentesis, biopsy and then discussion with oncology for treatment options.  Educated patient on expectations of both of these procedures   Again her focus of care is quality life.  Discussed concept of patient centered care, encouraging patient to keep her [place] at the center of all decisions.  They are encouraged to call with questions or concerns.  PMT will continue to support holistically.     Total time spent on the unit was 20 minutes   Time in 1315  Time out 1335   Greater than 50% time spent in counseling and coordination of care  Wadie Lessen NP  Palliative Medicine Team Team Phone # [phone] Pager [phone]

## 2016-12-07 NOTE — Plan of Care (Signed)
Problem: Education: Goal: Knowledge of [facility] Education information/materials will improve Outcome: Progressing VSS, free of falls during shift.  No complaints overnight.  Received PRN Klonopin for sleep.  Received 2u FFP, tolerated well.  Bed in low position, call bell within reach.  WCTM.

## 2016-12-07 NOTE — Evaluation (Signed)
Physical Therapy Evaluation Patient Details Name: JOHNATHAN TORTORELLI MRN: [id number] DOB: [birth year] Today's Date: 12/07/2016   History of Present Illness  Pt 83 yo female presented to hospital with progressive SOB; suspected Lung Cancer with metastasis to other sites.   Clinical Impression  Pt was alert, oriented and displayed good safety awareness during eval. Pt denied any pain stated she has been having difficulty breathing and often feels fatigued with activity. Lives independently at baseline w/o use of ADs. Pt was on room air; O2 sat at 95% in supine. Her overall strength and ROM are WFLs for her age, grossly at least 4+/5. Pt able to move to sitting position independently. Pt transferred to standing w/ supervision and RW for stability. She ambulated a total of 160 ft under supervision, 50 ft w/o RW and 110 ft w RW and stated it felt easier to walk w/ RW . She displayed increased fatigue and SOB during ambulation; O2 sat dropped to 87% when she returned to her room; and pt safely transferred to chair. O2 rose to 93% sitting on room air after 120 secs. Provided education on energy conservation techniques and the importance of family support for when she returns home. Pt's functional mobility is limited by cardiopulmonary impairments and decreased activity tolerance. She will benefit from skilled PT to correct above deficits. Recommend HHPT upon dc from acute hospitalization w/ intermittent supervision to assess medical status.     Follow Up Recommendations Home health PT;Supervision - Intermittent    Equipment Recommendations  Rolling walker with 5" wheels    Recommendations for Other Services       Precautions / Restrictions Precautions Precautions: Fall Restrictions Weight Bearing Restrictions: No      Mobility  Bed Mobility Overal bed mobility: Independent             General bed mobility comments: able to transfer to sitting w/o difficulty  Transfers Overall transfer  level: Modified independent Equipment used: Rolling walker (2 wheeled)             General transfer comment: RW for safety- good push off w/ UE   Ambulation/Gait Ambulation/Gait assistance: Supervision Ambulation Distance (Feet): 50 Feet   Gait Pattern/deviations: Step-through pattern     General Gait Details: Overall WFLs, slight swaying w/o AD, increased work of breathing during Health and safety inspector    Modified Rankin (Stroke Patients Only)       Balance Overall balance assessment: Needs assistance Sitting-balance support: No upper extremity supported Sitting balance-Leahy Scale: Normal Sitting balance - Comments: Able to maintain uprigth sitting posture w/o difficulty   Standing balance support: Bilateral upper extremity supported Standing balance-Leahy Scale: Good Standing balance comment: RW for additional stability                              Pertinent Vitals/Pain Pain Assessment: No/denies pain    Home Living Family/patient expects to be discharged to:: Private residence Living Arrangements: Alone Available Help at Discharge: Family (Said she would be able to find family to help) Type of Home: House Home Access: Level entry     Home Layout: One level Home Equipment: None Additional Comments: stated home is handicap accessible because her husband was disabled before he passed    Prior Function Level of Independence: Independent               Hand Dominance  Dominant Hand: Left    Extremity/Trunk Assessment   Upper Extremity Assessment Upper Extremity Assessment: Overall WFL for tasks assessed    Lower Extremity Assessment Lower Extremity Assessment: Overall WFL for tasks assessed       Communication   Communication: No difficulties  Cognition Arousal/Alertness: Awake/alert Behavior During Therapy: WFL for tasks assessed/performed Overall Cognitive Status: Within Functional Limits  for tasks assessed                      General Comments      Exercises Other Exercises Other Exercises: gait training: RW w/ Contact guard for safey to improve cardiovascular endurance; 110 ft, normal gait speed, and good safety awareness   Assessment/Plan    PT Assessment Patient needs continued PT services  PT Problem List Decreased activity tolerance;Cardiopulmonary status limiting activity;Decreased balance;Decreased knowledge of use of DME;Decreased mobility          PT Treatment Interventions DME instruction;Gait training;Therapeutic exercise;Therapeutic activities;Balance training;Patient/family education;Functional mobility training    PT Goals (Current goals can be found in the Care Plan section)  Acute Rehab PT Goals Patient Stated Goal: Return home  PT Goal Formulation: With patient Time For Goal Achievement: 12/21/16 Potential to Achieve Goals: Good    Frequency Min 2X/week   Barriers to discharge Decreased caregiver support  (lives alone)    Co-evaluation               End of Session Equipment Utilized During Treatment: Gait belt Activity Tolerance: Patient limited by fatigue Patient left: in chair;with call bell/phone within reach;with chair alarm set;with family/visitor present Nurse Communication: Mobility status;Need for lift equipment (RW)         Time: 2671-2458 PT Time Calculation (min) (ACUTE ONLY): 20 min   Charges:         PT G Codes:        Amaliya [place]elaw Student PT 12/07/2016, 12:43 PM

## 2016-12-07 NOTE — Progress Notes (Signed)
[facility] [place] NAME: [name] [name]    MR#:  [id number]  DATE OF BIRTH:  08/16/1928  SUBJECTIVE:  Not sleep very well, wanting anxiety medication before procedure, wants to eat REVIEW OF SYSTEMS:   Review of Systems  Constitutional: Positive for weight loss. Negative for chills and fever.  HENT: Negative for ear discharge, ear pain and nosebleeds.   Eyes: Negative for blurred vision, pain and discharge.  Respiratory: Positive for sputum production and shortness of breath. Negative for wheezing and stridor.   Cardiovascular: Negative for chest pain, palpitations, orthopnea and PND.  Gastrointestinal: Negative for abdominal pain, diarrhea, nausea and vomiting.  Genitourinary: Negative for frequency and urgency.  Musculoskeletal: Negative for back pain and joint pain.  Neurological: Positive for weakness. Negative for sensory change, speech change and focal weakness.  Psychiatric/Behavioral: Negative for depression and hallucinations. The patient is not nervous/anxious.    Tolerating Diet:yes Tolerating PT: Pending evaluation  DRUG ALLERGIES:  No Known Allergies  VITALS:  Blood pressure 117/87, pulse (!) 104, temperature 98.3 F (36.8 C), temperature source Oral, resp. rate 19, height '5\' 6"'$  (1.676 m), weight 70.3 kg (155 lb), SpO2 92 %.  PHYSICAL EXAMINATION:   Physical Exam  GENERAL:  83 y.o.-year-old patient lying in the bed with no acute distress.  EYES: Pupils equal, round, reactive to light and accommodation. No scleral icterus. Extraocular muscles intact.  HEENT: Head atraumatic, normocephalic. Oropharynx and nasopharynx clear.  NECK:  Supple, no jugular venous distention. No thyroid enlargement, no tenderness.  LUNGS: distant breath sounds bilaterally, no wheezing, rales, rhonchi. No use of accessory muscles of respiration.  CARDIOVASCULAR: S1, S2 normal. No murmurs, rubs, or gallops.  ABDOMEN: Soft, nontender,  nondistended. Bowel sounds present. No organomegaly or mass.  EXTREMITIES: No cyanosis, clubbing or edema b/l.    NEUROLOGIC: Cranial nerves II through XII are intact. No focal Motor or sensory deficits b/l.   PSYCHIATRIC:  patient is alert and oriented x 3.  SKIN: No obvious rash, lesion, or ulcer.  LABORATORY PANEL:  CBC  Recent Labs Lab 12/05/16 1657  WBC 4.4  HGB 12.2  HCT 34.0*  PLT 131*    Chemistries   Recent Labs Lab 12/05/16 1145 12/06/16 0547  NA 130* 130*  K 4.8 4.1  CL 98* 97*  CO2 26 25  GLUCOSE 131* 103*  BUN 12 10  CREATININE 1.02* 0.76  CALCIUM 9.5 9.1  AST 32  --   ALT 14  --   ALKPHOS 67  --   BILITOT 1.2  --    Cardiac Enzymes  Recent Labs Lab 12/05/16 1657  TROPONINI <0.03   RADIOLOGY:  Dg Chest 2 View  Result Date: 12/05/2016 CLINICAL DATA:  One month of shortness of breath. History of atrial fibrillation, aortic stenosis, discontinued smoking 2 years ago. EXAM: CHEST  2 VIEW COMPARISON:  PA and lateral chest x-ray of November 17, 2016 FINDINGS: Since the previous study these right-sided pleural effusion has increased in volume. The right hemidiaphragm is obscured. The left lung is well-expanded. There is a trace of pleural fluid on the left. The heart is top-normal in size. The pulmonary vascularity is mildly prominent. There is soft tissue fullness in the right hilar region more conspicuous than in the past. There is mitral annular calcification. There is calcification in the wall of the thoracic aorta. IMPRESSION: COPD. Increased volume of the right-sided pleural effusion which now occupies approximately 1/3 of the pleural space volume.  This may be obscuring a basilar pneumonia or neoplasm. Persistent soft tissue fullness in the right hilar region new since December 2015. Mild CHF.  Thoracic aortic atherosclerosis. Chest CT scanning is recommended to further evaluate the right hilum and right lower hemithorax. Electronically Signed   By: David   Martinique M.D.   On: 12/05/2016 12:42   Ct Head W & Wo Contrast  Result Date: 12/06/2016 CLINICAL DATA:  Productive cough for several weeks. Massive adenopathy noted on recent chest CT with possible superior vena cava compression. Follow-up evaluation. EXAM: CT HEAD WITHOUT AND WITH CONTRAST CT NECK WITH CONTRAST TECHNIQUE: Contiguous axial images were obtained from the base of the skull through the vertex without and with intravenous contrast. Multidetector CT imaging of the and neck was performed using the standard protocol following the bolus administration of intravenous contrast. CONTRAST:  44m ISOVUE-300 IOPAMIDOL (ISOVUE-300) INJECTION 61% COMPARISON:  CT chest December 05, 2016 FINDINGS: CT HEAD FINDINGS BRAIN: The ventricles and sulci are normal for age. No intraparenchymal hemorrhage, mass effect nor midline shift. Patchy supratentorial white matter hypodensities less than expected for patient's age, though non-specific are most compatible with chronic small vessel ischemic disease. 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 No acute large vascular territory infarcts. No abnormal extra-axial fluid collections. Basal cisterns are patent. VASCULAR: Moderate calcific atherosclerosis of the carotid siphons. SKULL: No skull fracture. Scattered subcentimeter calvarial lytic lesions. No significant scalp soft tissue swelling. SINUSES/ORBITS: The mastoid air-cells and included paranasal sinuses are well-aerated.The included ocular globes and orbital contents are non-suspicious. OTHER: None. CT NECK FINDINGS PHARYNX AND LARYNX: Slight asymmetric fullness LEFT base of tongue without discrete nodule. Aerodigestive tract is otherwise unremarkable. SALIVARY GLANDS: Mildly edematous bilateral submandibular glands without sialolith, ductal dilatation or mass. THYROID: Normal. LYMPH NODES: Bilateral supraclavicular lymphadenopathy measuring up to 3 cm. Bulky level 6 and 7 lymph nodes measure up to 19 mm short access. LEFT  retropectoral lymphadenopathy. VASCULAR: Mass-effect on RIGHT internal jugular vein which remains patent. Lymphadenopathy encases and narrows the brachiocephalic confluence, veins are patent. Patent superior vena cava. Severe calcific atherosclerosis of the carotid bifurcations with suspected hemodynamically significant stenosis. SKELETON: Degenerative cervical spine including severe LEFT facet arthropathy without acute osseous process. Mild chronic appearing T4 compression fracture. OTHER: RIGHT neck skin thickening. Trace effusions in the neck with subcutaneous fat stranding. No drainable fluid collections. IMPRESSION: CT HEAD: Calvarial lesions suspicious for metastasis in the setting of neoplasm, less likely multiple hemangiomas. 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 CT NECK: Bulky lower neck lymphadenopathy highly concerning for lymphoproliferative disorder/lymphoma versus metastatic disease. Mild neck edema. Atherosclerosis and suspected severe stenosis bilateral internal carotid arteries which would be better characterized on CTA NECK. Patent venous structures including superior vena cava. Electronically Signed   By: CElon AlasM.D.   On: 12/06/2016 21:30   Ct Soft Tissue Neck W Wo Contrast  Result Date: 12/06/2016 CLINICAL DATA:  Productive cough for several weeks. Massive adenopathy noted on recent chest CT with possible superior vena cava compression. Follow-up evaluation. EXAM: CT HEAD WITHOUT AND WITH CONTRAST CT NECK WITH CONTRAST TECHNIQUE: Contiguous axial images were obtained from the base of the skull through the vertex without and with intravenous contrast. Multidetector CT imaging of the and neck was performed using the standard protocol following the bolus administration of intravenous contrast. CONTRAST:  75mISOVUE-300 IOPAMIDOL (ISOVUE-300) INJECTION 61% COMPARISON:  CT chest December 05, 2016 FINDINGS: CT HEAD FINDINGS  BRAIN: The ventricles and  sulci are normal for age. No intraparenchymal hemorrhage, mass effect nor midline shift. Patchy supratentorial white matter hypodensities less than expected for patient's age, though non-specific are most compatible with chronic small vessel ischemic disease. 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 No acute large vascular territory infarcts. No abnormal extra-axial fluid collections. Basal cisterns are patent. VASCULAR: Moderate calcific atherosclerosis of the carotid siphons. SKULL: No skull fracture. Scattered subcentimeter calvarial lytic lesions. No significant scalp soft tissue swelling. SINUSES/ORBITS: The mastoid air-cells and included paranasal sinuses are well-aerated.The included ocular globes and orbital contents are non-suspicious. OTHER: None. CT NECK FINDINGS PHARYNX AND LARYNX: Slight asymmetric fullness LEFT base of tongue without discrete nodule. Aerodigestive tract is otherwise unremarkable. SALIVARY GLANDS: Mildly edematous bilateral submandibular glands without sialolith, ductal dilatation or mass. THYROID: Normal. LYMPH NODES: Bilateral supraclavicular lymphadenopathy measuring up to 3 cm. Bulky level 6 and 7 lymph nodes measure up to 19 mm short access. LEFT retropectoral lymphadenopathy. VASCULAR: Mass-effect on RIGHT internal jugular vein which remains patent. Lymphadenopathy encases and narrows the brachiocephalic confluence, veins are patent. Patent superior vena cava. Severe calcific atherosclerosis of the carotid bifurcations with suspected hemodynamically significant stenosis. SKELETON: Degenerative cervical spine including severe LEFT facet arthropathy without acute osseous process. Mild chronic appearing T4 compression fracture. OTHER: RIGHT neck skin thickening. Trace effusions in the neck with subcutaneous fat stranding. No drainable fluid collections. IMPRESSION: CT HEAD: Calvarial lesions suspicious for metastasis in the setting of neoplasm, less likely  multiple hemangiomas. 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 CT NECK: Bulky lower neck lymphadenopathy highly concerning for lymphoproliferative disorder/lymphoma versus metastatic disease. Mild neck edema. Atherosclerosis and suspected severe stenosis bilateral internal carotid arteries which would be better characterized on CTA NECK. Patent venous structures including superior vena cava. Electronically Signed   By: Elon Alas M.D.   On: 12/06/2016 21:30   Ct Chest W Contrast  Result Date: 12/05/2016 CLINICAL DATA:  Plain films demonstrating right-sided airspace disease and suggestion of right hilar soft tissue fullness. Shortness of breath for 1 month. EXAM: CT CHEST WITH CONTRAST TECHNIQUE: Multidetector CT imaging of the chest was performed during intravenous contrast administration. CONTRAST:  78m ISOVUE-300 IOPAMIDOL (ISOVUE-300) INJECTION 61% COMPARISON:  Multiple plain films, most recent 12/05/2016. No prior CTs for comparison. FINDINGS: Cardiovascular: Advanced aortic and branch vessel atherosclerosis. Moderate cardiomegaly with no pericardial effusion. Multivessel coronary artery atherosclerosis. No central pulmonary embolism, on this non-dedicated study. Narrowing of the confluence of the brachiocephalic veins and less so the superior aspect of the SVC. Example image 43/series 2. Mediastinum/Nodes: Massive bilateral supraclavicular adenopathy, including at 3.2 cm in the left supraclavicular space. Marked mediastinal adenopathy, with a right paratracheal node measuring 3.4 cm on image 54/series 2. Subcarinal node measures 2.5 cm on image 75/series 2. Right hilar adenopathy at 2.5 cm. Moderate hiatal hernia.  Anterior mediastinal nodal mass at 2.6 cm. Lungs/Pleura: Small to moderate right-sided pleural effusion, without well-defined pleural mass Minimal motion degradation. Patent airways with mass-effect upon right lower and middle lobe  bronchi. Right middle lobe partial collapse. Dependent right lower lobe airspace disease. Soft tissue mass centered at the lateral aspect of the origin of the right middle lobe bronchus could be pulmonary parenchymal or nodal. Measures 2.9 x 3.9 cm on image 79/series 2. Upper Abdomen: Cholecystectomy. Pneumobilia. Hypoattenuating lateral segment left liver lobe lesion is 1.5 cm and represent a cyst or complex cyst on image 129/series 2. Heterogeneous enhancement involving the remainder of the liver is subtle.  Normal imaged portions of the spleen, pancreas, kidneys. Normal imaged adrenal glands. Abdominal aortic atherosclerosis. No upper abdominal adenopathy. Musculoskeletal: Left axillary adenopathy, include at 1.6 cm on image 33/series 2. Osteopenia. Mildly heterogeneous marrow density, especially involving the left side of T8 vertebral body. IMPRESSION: 1. Massive adenopathy in the low neck and chest, most consistent with neoplasm. Considerations include small-cell lung cancer or lymphoma. The only potential side of primary in the lungs is an area of soft tissue fullness in the right infrahilar region, just lateral to the origin of the right middle lobe bronchus. This could represent the primary pulmonary mass or adenopathy. 2. Right-sided pleural effusion with right middle lobe partial collapse 3. Heterogeneous hepatic density is nonspecific. Metastatic disease cannot be excluded. The largest hypoattenuating area is relatively low density and could represent a cyst or complex cyst. Recommend attention on follow-up. 4. Probable osseous metastasis, most apparent at T8. 5. Consider further evaluation with nonemergent PET and/or thoracic surgery/oncology consultation. 6. Given the extent of upper thoracic adenopathy, the patient may be predisposed to SVC syndrome. Electronically Signed   By: Abigail Miyamoto M.D.   On: 12/05/2016 17:38   ASSESSMENT AND PLAN:  Anaiah Mcmannis  is a 83 y.o. female with a known  history of A. fib, carotid stenosis, CKD and hypertension. The patient has had exertional dyspnea for several months, which has been worsening progressively for the past a few weeks. She developed severe dry cough and was treated 10 with Z-Pak 10 days ago without improvement   *New diagnosis of Lung mass  With possible  metastasis to liver and T8 and right-sided pleural effusion - Worrisome for possible lymphoma - Appreciate oncology Dr Rogue Bussing input -hold coumadin for biopsy or pleural tap- FFP, and vitamin K for procedure given -h/o heavy tobacco abuse -sats 96% on Ra - K Jewell to have ultrasound-guided thoracentesis and IR guided neck node biopsy  * Hyponatremia: Improving with normal saline IV and follow-up BMP. - Possible SIADH with lung mass  *Chronic A. Fib. Hold Coumadin for possible biopsy or thoracentesis and follow-up INR. Continue Coreg.  *Hypertension. Continue hypertension medication    Case discussed with Care Management/Social Worker. Management plans discussed with the patient, nursing and they are in agreement.  CODE STATUS: full, palliative care following  DVT Prophylaxis: SCD, also on Coumadin  TOTAL TIME TAKING CARE OF THIS PATIENT: 25 minutes.  >50% time spent on counselling and coordination of care  POSSIBLE D/C IN am, DEPENDING ON CLINICAL CONDITION.  And oncology evaluation  Note: This dictation was prepared with Dragon dictation along with smaller phrase technology. Any transcriptional errors that result from this process are unintentional.  Max Sane M.D on 12/07/2016 at 8:27 AM  Between 7am to 6pm - Pager - [phone]  After 6pm go to [email or web address] - password [email or web address] [facility]  Office  [phone]  CC: Primary care physician; Einar Pheasant, MD

## 2016-12-08 ENCOUNTER — Inpatient Hospital Stay: Payer: Medicare Other

## 2016-12-08 DIAGNOSIS — C3411 Malignant neoplasm of upper lobe, right bronchus or lung: Secondary | ICD-10-CM

## 2016-12-08 DIAGNOSIS — I959 Hypotension, unspecified: Secondary | ICD-10-CM

## 2016-12-08 LAB — BASIC METABOLIC PANEL
ANION GAP: 8 (ref 5–15)
BUN: 9 mg/dL (ref 6–20)
CO2: 28 mmol/L (ref 22–32)
Calcium: 9.2 mg/dL (ref 8.9–10.3)
Chloride: 95 mmol/L — ABNORMAL LOW (ref 101–111)
Creatinine, Ser: 0.86 mg/dL (ref 0.44–1.00)
GFR, EST NON AFRICAN AMERICAN: 59 mL/min — AB (ref >60)
Glucose, Bld: 91 mg/dL (ref 65–99)
POTASSIUM: 4.1 mmol/L (ref 3.5–5.1)
SODIUM: 131 mmol/L — AB (ref 135–145)

## 2016-12-08 LAB — PREPARE FRESH FROZEN PLASMA
BLOOD PRODUCT EXPIRATION DATE: 201801302359
Blood Product Expiration Date: 201801292359
ISSUE DATE / TIME: 201801242221
ISSUE DATE / TIME: 201801250116
Unit Type and Rh: 6200
Unit Type and Rh: 8400

## 2016-12-08 LAB — URINE CULTURE

## 2016-12-08 LAB — CBC
HCT: 31.6 % — ABNORMAL LOW (ref 35.0–47.0)
Hemoglobin: 11.5 g/dL — ABNORMAL LOW (ref 12.0–16.0)
MCH: 34.1 pg — ABNORMAL HIGH (ref 26.0–34.0)
MCHC: 36.4 g/dL — ABNORMAL HIGH (ref 32.0–36.0)
MCV: 93.6 fL (ref 80.0–100.0)
PLATELETS: 122 10*3/uL — AB (ref 150–440)
RBC: 3.38 MIL/uL — AB (ref 3.80–5.20)
RDW: 13 % (ref 11.5–14.5)
WBC: 4.1 10*3/uL (ref 3.6–11.0)

## 2016-12-08 LAB — PROTIME-INR
INR: 1.28
INR: 1.23
PROTHROMBIN TIME: 16.1 s — AB (ref 11.4–15.2)
Prothrombin Time: 15.6 seconds — ABNORMAL HIGH (ref 11.4–15.2)

## 2016-12-08 LAB — MISC LABCORP TEST (SEND OUT): LABCORP TEST CODE: 19588

## 2016-12-08 MED ORDER — FENTANYL CITRATE (PF) 100 MCG/2ML IJ SOLN
INTRAMUSCULAR | Status: AC
  Filled 2016-12-08: qty 4

## 2016-12-08 MED ORDER — SODIUM CHLORIDE 0.9 % IV BOLUS (SEPSIS)
250.0000 mL | Freq: Once | INTRAVENOUS | Status: AC
  Administered 2016-12-08: 17:00:00 250 mL via INTRAVENOUS

## 2016-12-08 MED ORDER — WARFARIN SODIUM 3 MG PO TABS
3.0000 mg | ORAL_TABLET | Freq: Once | ORAL | Status: AC
  Administered 2016-12-08: 3 mg via ORAL
  Filled 2016-12-08: qty 1

## 2016-12-08 MED ORDER — MIDAZOLAM HCL 5 MG/5ML IJ SOLN
INTRAMUSCULAR | Status: AC
  Filled 2016-12-08: qty 5

## 2016-12-08 MED ORDER — MIDAZOLAM HCL 5 MG/5ML IJ SOLN
INTRAMUSCULAR | Status: AC | PRN
  Administered 2016-12-08: 0.5 mg via INTRAVENOUS

## 2016-12-08 MED ORDER — FENTANYL CITRATE (PF) 100 MCG/2ML IJ SOLN
INTRAMUSCULAR | Status: AC | PRN
  Administered 2016-12-08: 25 ug via INTRAVENOUS

## 2016-12-08 MED ORDER — SODIUM CHLORIDE 0.9 % IV SOLN
INTRAVENOUS | Status: DC
  Administered 2016-12-08: 12:00:00 via INTRAVENOUS

## 2016-12-08 NOTE — Progress Notes (Signed)
pts b/p continues to be low.  Pt  C/o feeling lightheaded.  No n/v . Denies pain. Dr Manuella Ghazi notified  Of pts  Low b/p.  Order to transfer. pt had [place] core bx today.

## 2016-12-08 NOTE — Care Management (Signed)
Admitted to this facility with the diagnosis of lung mass. Lives alone. Daughter is Hinton Dyer [phone]). Last seen Dr. Einar Pheasant 11/17/16. No home health in the past. No skilled facility. No home oxygen. Rolling walker and cane in the home, no other equipment. No Life Alert. Prescriptions are filled at [facility]. Takes care of all basic activities of daily living herself, drives. Decreased appetite x 2 weeks. Lost a couple of pounds. Last fall was October 2017. Daughter will transport.                                                  Physical therapy evaluation completed. Recommends home with home health and physical therapy. Chose [facility]. Will update Doreatha Lew, RN representative for [employer]. Information for Med Alert given to Ms. Glenarden RN MSN CCM Care Management

## 2016-12-08 NOTE — Consult Note (Signed)
Name: [name] [name] MRN: [id number] DOB: [birth year]    ADMISSION DATE:  12/05/2016 CONSULTATION DATE:  12/08/2016  REFERRING MD :  Dr. Manuella Ghazi   CHIEF COMPLAINT:  Hypotension  BRIEF PATIENT DESCRIPTION: This is an 83 yo female who presented to [facility] ER 01/23 with shortness of breath CT of chest revealed right lung mass, right pleural effusion, and lymphadenopathy.    SIGNIFICANT EVENTS  01/23-Pt admitted to [facility] Oncology Unit with shortness of breath CT of chest on admission revealed right lung mass, right pleural effusion, and lymphadenopathy 01/26-Pt transferred to Stepdown Unit due to hypotension secondary to sedating medications received during IR guided neck biopsy PCCM consulted for additional management  STUDIES:  CT Chest 01/23>>Massive adenopathy in the low neck and chest, most consistent with neoplasm. Considerations include small-cell lung cancer or lymphoma. The only potential side of primary in the lungs is an area of soft tissue fullness in the right infrahilar region, just lateral to the origin of the right middle lobe bronchus. This could represent the primary pulmonary mass or adenopathy. Right-sided pleural effusion with right middle lobe partial collapse Heterogeneous hepatic density is nonspecific. Metastatic disease cannot be excluded. The largest hypoattenuating area is relatively low density and could represent a cyst or complex cyst. Recommend attention on follow-up. Probable osseous metastasis, most apparent at T8. Consider further evaluation with nonemergent PET and/or thoracic surgery/oncology consultation. Given the extent of upper thoracic adenopathy, the patient may be predisposed to SVC syndrome CT Head and Soft Tissue Neck 01/24>>Calvarial lesions suspicious for metastasis in the setting of neoplasm, less likely multiple hemangiomas. Old small LEFT cerebellar infarcts; otherwise negative Bulky lower neck lymphadenopathy highly concerning for  lymphoproliferative disorder/lymphoma versus metastatic disease. Mild neck edema. Atherosclerosis and suspected severe stenosis bilateral internal carotid arteries which would be better characterized on CTA NECK. Patent venous structures including superior vena cava. Thoracentesis due to right sided pleural effusion with middle lobe partial collapse 01/24>>removal of 1.45L clear amber fluid, fluid did not show malignant cells IR Guided Neck Biopsy 01/26>>  HISTORY OF PRESENT ILLNESS:   This is an 83 yo female with a PMH of Osteoarthritis, Impaired fasting glucose, HTN, Hyperlipidemia, GERD, Gallstones, Chronic lower back pain, CKD, Cholelithiasis, Carotid stenosis, Atrial fibrillation, and Former smoker.  She presented to [facility] ER on 01/23 with c/o shortness of breath with activity.  Per ER notes the pt reported onset of symptoms was several months ago and the shortness of breath has gotten progressively worse with exertion.  Several weeks prior to presentation to the ER she had developed a dry cough and was treated with a Z-Pak for 10 days with no improvement of symptoms.  At that time a chest xray was obtained that revealed an "abnormality" in the right lung.  CT of chest in the ER on 01/23 revealed a right lung mass with possible metastasis, right pleural effusion, and lymphadenopathy.  Therefore, she was admitted to the Oncology Unit for further management.  She underwent a right sided thoracentesis on 01/24 with removal of 1.45L of clear amber fluid. On 01/26 the pt underwent an IR Guided Neck Biopsy following the procedure she became hypotensive secondary to sedating medications during procedure, she was subsequently transferred to the [facility] Unit and PCCM consulted for further management.    PAST MEDICAL HISTORY :   has a past medical history of Atrial fibrillation ([place]); Carotid stenosis; Cholelithiasis; Chronic kidney disease; Chronic low back pain; Gallstone pancreatitis (11-10-14); Gallstones;  GERD (gastroesophageal reflux disease); Hyperlipidemia; Hypertension;  Impaired fasting glucose; and Osteoarthritis.  has a past surgical history that includes Cataract extraction; Appendectomy; Cholecystectomy (11-10-14); and Cholecystectomy. Prior to Admission medications   Medication Sig Start Date End Date Taking? Authorizing Provider  albuterol (PROVENTIL HFA;VENTOLIN HFA) 108 (90 Base) MCG/ACT inhaler Inhale 2 puffs into the lungs every 6 (six) hours as needed for wheezing or shortness of breath. 09/07/16  Yes Minna Merritts, MD  carvedilol (COREG) 25 MG tablet TAKE 1 TABLET (25 MG TOTAL) BY MOUTH 2 (TWO) TIMES DAILY WITH A MEAL. Patient taking differently: TAKE 1/2 TABLET (12.5 MG TOTAL) BY MOUTH 2 (TWO) TIMES DAILY WITH A MEAL. 05/23/16  Yes Crecencio Mc, MD  clonazePAM (KLONOPIN) 0.5 MG tablet TAKE 1/2 TO 1 TABLET BY MOUTH ONCE DAILY AS NEEDED 11/14/16  Yes Einar Pheasant, MD  hydrALAZINE (APRESOLINE) 10 MG tablet TAKE 1 TABLET BY MOUTH 3 TIMES A DAY 03/31/16  Yes Einar Pheasant, MD  losartan (COZAAR) 100 MG tablet TAKE 1 TABLET BY MOUTH DAILY. 04/12/16  Yes Einar Pheasant, MD  omeprazole (PRILOSEC) 20 MG capsule Take 20 mg by mouth daily.   Yes Historical Provider, MD  pravastatin (PRAVACHOL) 40 MG tablet TAKE 1 TABLET (40 MG TOTAL) BY MOUTH DAILY. 12/03/15  Yes Minna Merritts, MD  warfarin (COUMADIN) 5 MG tablet TAKE AS DIRECTED BY ANTICOAGULATION CLINIC Patient taking differently: 1/2 tablet daily 10/09/16  Yes Minna Merritts, MD  warfarin (COUMADIN) 5 MG tablet Take 2.5 mg by mouth daily.   Yes Historical Provider, MD  azithromycin (ZITHROMAX) 250 MG tablet Take two tablets x 1 day and then one tablet per day for four more days. Patient not taking: Reported on 12/05/2016 11/20/16   Einar Pheasant, MD   No Known Allergies  FAMILY HISTORY:  family history includes Atrial fibrillation in her brother and sister; Heart attack (age of onset: 43) in her father; Heart failure (age of onset:  71) in her sister; Stroke in her sister. SOCIAL HISTORY:  reports that she quit smoking about 2 years ago. Her smoking use included Cigarettes. She smoked 0.50 packs per day. She has never used smokeless tobacco. She reports that she drinks alcohol. She reports that she does not use drugs.  REVIEW OF SYSTEMS: Positives in BOLD Constitutional: fever, chills, weight loss, malaise/fatigue and diaphoresis.  HENT: Negative for hearing loss, ear pain, nosebleeds, congestion, sore throat, neck pain, tinnitus and ear discharge.   Eyes: Negative for blurred vision, double vision, photophobia, pain, discharge and redness.  Respiratory: Negative for cough, hemoptysis, sputum production, shortness of breath, wheezing and stridor.   Cardiovascular: Negative for chest pain, palpitations, orthopnea, claudication, leg swelling and PND.  Gastrointestinal: Negative for heartburn, nausea, vomiting, abdominal pain, diarrhea, constipation, blood in stool and melena.  Genitourinary: Negative for dysuria, urgency, frequency, hematuria and flank pain.  Musculoskeletal: Negative for myalgias, back pain, joint pain and falls.  Skin: Negative for itching and rash.  Neurological: dizziness, tingling, tremors, sensory change, speech change, focal weakness, seizures, loss of consciousness, weakness and headaches.  Endo/Heme/Allergies: Negative for environmental allergies and polydipsia. Does not bruise/bleed easily.  SUBJECTIVE:  Pt states lightheadedness has resolved she feels back to baseline no new complaints.   VITAL SIGNS: Temp:  [97.3 F (36.3 C)-98.6 F (37 C)] 97.6 F (36.4 C) (01/26 1517) Pulse Rate:  [82-106] 96 (01/26 1535) Resp:  [14-26] 18 (01/26 1517) BP: (73-144)/(45-90) 82/52 (01/26 1535) SpO2:  [91 %-97 %] 95 % (01/26 1517)  PHYSICAL EXAMINATION: General: pleasant Caucasian female resting  in bed no acute distress Neuro: alert and oriented, follows commands, PERRLA HEENT:  Supple, no  JVD Cardiovascular: faint murmur, rrr, s1s2, no R/G Lungs: diminished throughout, even, no labored Abdomen: +BS x4, soft, non tender, non distended Musculoskeletal:  Normal bulk and tone, no edema Skin:  Scattered generalized ecchymosis    Recent Labs Lab 12/06/16 0547 12/07/16 0518 12/08/16 0335  NA 130* 130* 131*  K 4.1 3.6 4.1  CL 97* 94* 95*  CO2 '25 28 28  '$ BUN '10 9 9  '$ CREATININE 0.76 0.81 0.86  GLUCOSE 103* 100* 91    Recent Labs Lab 12/05/16 1657 12/07/16 0518 12/08/16 0335  HGB 12.2 10.9* 11.5*  HCT 34.0* 30.0* 31.6*  WBC 4.4 3.7 4.1  PLT 131* 115* 122*   Dg Chest 1 View  Result Date: 12/07/2016 CLINICAL DATA:  S/p thoracentesis  Pleural effusion right EXAM: CHEST  1 VIEW COMPARISON:  12/05/2016 FINDINGS: Interval resolution of the previously noted right pleural effusion. No pneumothorax. Improved aeration of the right lung base. Heart size upper limits normal.  Atheromatous aorta. Mild thoracolumbar scoliosis. Cholecystectomy clips. IMPRESSION: 1. Resolution of right pleural effusion post thoracentesis, with no pneumothorax. Electronically Signed   By: Lucrezia Europe M.D.   On: 12/07/2016 16:14   Ct Head W & Wo Contrast  Result Date: 12/06/2016 CLINICAL DATA:  Productive cough for several weeks. Massive adenopathy noted on recent chest CT with possible superior vena cava compression. Follow-up evaluation. EXAM: CT HEAD WITHOUT AND WITH CONTRAST CT NECK WITH CONTRAST TECHNIQUE: Contiguous axial images were obtained from the base of the skull through the vertex without and with intravenous contrast. Multidetector CT imaging of the and neck was performed using the standard protocol following the bolus administration of intravenous contrast. CONTRAST:  63m ISOVUE-300 IOPAMIDOL (ISOVUE-300) INJECTION 61% COMPARISON:  CT chest December 05, 2016 FINDINGS: CT HEAD FINDINGS BRAIN: The ventricles and sulci are normal for age. No intraparenchymal hemorrhage, mass effect nor midline  shift. Patchy supratentorial white matter hypodensities less than expected for patient's age, though non-specific are most compatible with chronic small vessel ischemic disease. 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 No acute large vascular territory infarcts. No abnormal extra-axial fluid collections. Basal cisterns are patent. VASCULAR: Moderate calcific atherosclerosis of the carotid siphons. SKULL: No skull fracture. Scattered subcentimeter calvarial lytic lesions. No significant scalp soft tissue swelling. SINUSES/ORBITS: The mastoid air-cells and included paranasal sinuses are well-aerated.The included ocular globes and orbital contents are non-suspicious. OTHER: None. CT NECK FINDINGS PHARYNX AND LARYNX: Slight asymmetric fullness LEFT base of tongue without discrete nodule. Aerodigestive tract is otherwise unremarkable. SALIVARY GLANDS: Mildly edematous bilateral submandibular glands without sialolith, ductal dilatation or mass. THYROID: Normal. LYMPH NODES: Bilateral supraclavicular lymphadenopathy measuring up to 3 cm. Bulky level 6 and 7 lymph nodes measure up to 19 mm short access. LEFT retropectoral lymphadenopathy. VASCULAR: Mass-effect on RIGHT internal jugular vein which remains patent. Lymphadenopathy encases and narrows the brachiocephalic confluence, veins are patent. Patent superior vena cava. Severe calcific atherosclerosis of the carotid bifurcations with suspected hemodynamically significant stenosis. SKELETON: Degenerative cervical spine including severe LEFT facet arthropathy without acute osseous process. Mild chronic appearing T4 compression fracture. OTHER: RIGHT neck skin thickening. Trace effusions in the neck with subcutaneous fat stranding. No drainable fluid collections. IMPRESSION: CT HEAD: Calvarial lesions suspicious for metastasis in the setting of neoplasm, less likely multiple hemangiomas. 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 CT NECK: Bulky lower neck lymphadenopathy highly  concerning for lymphoproliferative disorder/lymphoma versus metastatic disease. Mild neck edema. Atherosclerosis and suspected severe stenosis bilateral internal carotid arteries which would be better characterized on CTA NECK. Patent venous structures including superior vena cava. Electronically Signed   By: Elon Alas M.D.   On: 12/06/2016 21:30   Ct Soft Tissue Neck W Wo Contrast  Result Date: 12/06/2016 CLINICAL DATA:  Productive cough for several weeks. Massive adenopathy noted on recent chest CT with possible superior vena cava compression. Follow-up evaluation. EXAM: CT HEAD WITHOUT AND WITH CONTRAST CT NECK WITH CONTRAST TECHNIQUE: Contiguous axial images were obtained from the base of the skull through the vertex without and with intravenous contrast. Multidetector CT imaging of the and neck was performed using the standard protocol following the bolus administration of intravenous contrast. CONTRAST:  72m ISOVUE-300 IOPAMIDOL (ISOVUE-300) INJECTION 61% COMPARISON:  CT chest December 05, 2016 FINDINGS: CT HEAD FINDINGS BRAIN: The ventricles and sulci are normal for age. No intraparenchymal hemorrhage, mass effect nor midline shift. Patchy supratentorial white matter hypodensities less than expected for patient's age, though non-specific are most compatible with chronic small vessel ischemic disease. 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 No acute large vascular territory infarcts. No abnormal extra-axial fluid collections. Basal cisterns are patent. VASCULAR: Moderate calcific atherosclerosis of the carotid siphons. SKULL: No skull fracture. Scattered subcentimeter calvarial lytic lesions. No significant scalp soft tissue swelling. SINUSES/ORBITS: The mastoid air-cells and included paranasal sinuses are well-aerated.The included ocular globes and orbital contents are non-suspicious. OTHER: None. CT NECK FINDINGS PHARYNX AND LARYNX: Slight  asymmetric fullness LEFT base of tongue without discrete nodule. Aerodigestive tract is otherwise unremarkable. SALIVARY GLANDS: Mildly edematous bilateral submandibular glands without sialolith, ductal dilatation or mass. THYROID: Normal. LYMPH NODES: Bilateral supraclavicular lymphadenopathy measuring up to 3 cm. Bulky level 6 and 7 lymph nodes measure up to 19 mm short access. LEFT retropectoral lymphadenopathy. VASCULAR: Mass-effect on RIGHT internal jugular vein which remains patent. Lymphadenopathy encases and narrows the brachiocephalic confluence, veins are patent. Patent superior vena cava. Severe calcific atherosclerosis of the carotid bifurcations with suspected hemodynamically significant stenosis. SKELETON: Degenerative cervical spine including severe LEFT facet arthropathy without acute osseous process. Mild chronic appearing T4 compression fracture. OTHER: RIGHT neck skin thickening. Trace effusions in the neck with subcutaneous fat stranding. No drainable fluid collections. IMPRESSION: CT HEAD: Calvarial lesions suspicious for metastasis in the setting of neoplasm, less likely multiple hemangiomas. 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 CT NECK: Bulky lower neck lymphadenopathy highly concerning for lymphoproliferative disorder/lymphoma versus metastatic disease. Mild neck edema. Atherosclerosis and suspected severe stenosis bilateral internal carotid arteries which would be better characterized on CTA NECK. Patent venous structures including superior vena cava. Electronically Signed   By: CElon AlasM.D.   On: 12/06/2016 21:30   [place]  Result Date: 12/08/2016 CLINICAL DATA:  Bulky neck and mediastinal adenopathy of uncertain etiology. Negative pleural fluid cytology. EXAM: ULTRASOUND GUIDED CORE BIOPSY OF RIGHT SUPRACLAVICULAR ADENOPATHY MEDICATIONS: Intravenous Fentanyl and Versed were administered as conscious sedation  during continuous monitoring of the patient's level of consciousness and physiological / cardiorespiratory status by the radiology RN, with a total moderate sedation time of 10 minutes. PROCEDURE: The procedure, risks, benefits, and alternatives were explained to the patient. Questions regarding the procedure were encouraged and answered. The patient understands and consents to the procedure. Survey ultrasound of the right supraclavicular region was obtained. The adenopathy was localized and appropriate skin entry site determined and marked. The  operative field was prepped with chlorhexidine in a sterile fashion, and a sterile drape was applied covering the operative field. A sterile gown and sterile gloves were used for the procedure. Local anesthesia was provided with 1% Lidocaine. Under real-time ultrasound guidance, a 17 gauge trocar needle was advanced to the margin of the lesion. Once needle tip position was confirmed, coaxial 18-gauge core biopsy samples were obtained, submitted in saline to surgical pathology. The guide needle was removed. Postprocedure scans show no hematoma. COMPLICATIONS: None. FINDINGS: Right supraclavicular adenopathy was again localized. Representative core biopsy samples obtained as above. IMPRESSION: 1. Technically successful ultrasound-guided core biopsy, right supraclavicular adenopathy Electronically Signed   By: Lucrezia Europe M.D.   On: 12/08/2016 13:20   US Thoracentesis Asp Pleural Space W/img Guide  Result Date: 12/07/2016 CLINICAL DATA:  Massive adenopathy in the low neck and chest, areaof soft tissue fullness in the right infrahilar region, just lateralto the origin of the right middle lobe bronchus. Right-sided pleural effusion with right middle lobe partialcollapse. Probable osseous metastasis, most apparent at T8. EXAM: EXAM THORACENTESIS WITH ULTRASOUND TECHNIQUE: The procedure, risks (including but not limited to bleeding, infection, organ damage ), benefits, and  alternatives were explained to the patient. Questions regarding the procedure were encouraged and answered. The patient understands and consents to the procedure. Survey ultrasound of the right hemithorax was performed and an appropriate skin entry site was localized. Site was marked, prepped with chlorhexidine , draped in usual sterile fashion, infiltrated locally with 1% lidocaine. The Saf-T-Centesis needle was advanced into the pleural space. Clear amber fluid returned. 1.45 L was removed. Post procedure imaging shows no residual fluid. The patient tolerated procedure well. COMPLICATIONS: COMPLICATIONS None immediate IMPRESSION: Technically successful ultrasound-guided right thoracentesis. Follow-up chest radiograph shows no pneumothorax. Electronically Signed   By: Lucrezia Europe M.D.   On: 12/07/2016 16:16    ASSESSMENT / PLAN: Hypotension secondary to sedating medications New diagnosis of right lung mass with possible metastasis to liver and T8 Small cell lung cancer vs. Lymphoma  Bilateral supraclavicular adenopathy/mediastinal adenopathy Right sided pleural effusion P: Supplemental O2 to maintain O2 sats >92% IR Guided Neck Biopsy results pending Fluid resuscitation for hypotension if map does not improve with fluids will start peripheral neosynephrine and transfer pt to Stepdown Unit Continuous telemetry monitoring   Hold outpatient carvedilol and losartan  Oncology consulted appreciate input  Repeat CXR in am    Marda Stalker, [place] Pager [phone] (please enter 7 digits) PCCM Consult Pager [phone] (please enter 7 digits)

## 2016-12-08 NOTE — Progress Notes (Signed)
[place] [facility] [place] NAME: [name] [name]    MR#:  [id number]  DATE OF BIRTH:  [birth year]  SUBJECTIVE:  S/p [place] core biopsy R supraclavicular LN biopsy. BP persistently low, feeling dizzi REVIEW OF SYSTEMS:   Review of Systems  Constitutional: Positive for weight loss. Negative for chills and fever.  HENT: Negative for ear discharge, ear pain and nosebleeds.   Eyes: Negative for blurred vision, pain and discharge.  Respiratory: Positive for sputum production and shortness of breath. Negative for wheezing and stridor.   Cardiovascular: Negative for chest pain, palpitations, orthopnea and PND.  Gastrointestinal: Negative for abdominal pain, diarrhea, nausea and vomiting.  Genitourinary: Negative for frequency and urgency.  Musculoskeletal: Negative for back pain and joint pain.  Neurological: Positive for weakness. Negative for sensory change, speech change and focal weakness.  Psychiatric/Behavioral: Negative for depression and hallucinations. The patient is not nervous/anxious.    Tolerating Diet:yes Tolerating PT: Pending evaluation DRUG ALLERGIES:  No Known Allergies  VITALS:  Blood pressure (!) 82/52, pulse 96, temperature 97.6 F (36.4 C), temperature source Oral, resp. rate 18, height '5\' 6"'$  (1.676 m), weight 70.3 kg (155 lb), SpO2 95 %. PHYSICAL EXAMINATION:  Physical Exam  GENERAL:  83 y.o.-year-old patient lying in the bed with no acute distress.  EYES: Pupils equal, round, reactive to light and accommodation. No scleral icterus. Extraocular muscles intact.  HEENT: Head atraumatic, normocephalic. Oropharynx and nasopharynx clear.  NECK:  Supple, no jugular venous distention. No thyroid enlargement, no tenderness.  LUNGS: distant breath sounds bilaterally, no wheezing, rales, rhonchi. No use of accessory muscles of respiration.  CARDIOVASCULAR: S1, S2 normal. No murmurs, rubs, or gallops.  ABDOMEN: Soft, nontender,  nondistended. Bowel sounds present. No organomegaly or mass.  EXTREMITIES: No cyanosis, clubbing or edema b/l.    NEUROLOGIC: Cranial nerves II through XII are intact. No focal Motor or sensory deficits b/l.   PSYCHIATRIC:  patient is alert and oriented x 3.  SKIN: No obvious rash, lesion, or ulcer.  LABORATORY PANEL:  CBC  Recent Labs Lab 12/08/16 0335  WBC 4.1  HGB 11.5*  HCT 31.6*  PLT 122*    Chemistries   Recent Labs Lab 12/05/16 1145  12/08/16 0335  NA 130*  < > 131*  K 4.8  < > 4.1  CL 98*  < > 95*  CO2 26  < > 28  GLUCOSE 131*  < > 91  BUN 12  < > 9  CREATININE 1.02*  < > 0.86  CALCIUM 9.5  < > 9.2  AST 32  --   --   ALT 14  --   --   ALKPHOS 67  --   --   BILITOT 1.2  --   --   < > = values in this interval not displayed. Cardiac Enzymes  Recent Labs Lab 12/05/16 1657  TROPONINI <0.03   RADIOLOGY:  Dg Chest 1 View  Result Date: 12/07/2016 CLINICAL DATA:  S/p thoracentesis  Pleural effusion right EXAM: CHEST  1 VIEW COMPARISON:  12/05/2016 FINDINGS: Interval resolution of the previously noted right pleural effusion. No pneumothorax. Improved aeration of the right lung base. Heart size upper limits normal.  Atheromatous aorta. Mild thoracolumbar scoliosis. Cholecystectomy clips. IMPRESSION: 1. Resolution of right pleural effusion post thoracentesis, with no pneumothorax. Electronically Signed   By: Lucrezia Europe M.D.   On: 12/07/2016 16:14   Ct Head W & Wo Contrast  Result Date: 12/06/2016 CLINICAL  DATA:  Productive cough for several weeks. Massive adenopathy noted on recent chest CT with possible superior vena cava compression. Follow-up evaluation. EXAM: CT HEAD WITHOUT AND WITH CONTRAST CT NECK WITH CONTRAST TECHNIQUE: Contiguous axial images were obtained from the base of the skull through the vertex without and with intravenous contrast. Multidetector CT imaging of the and neck was performed using the standard protocol following the bolus administration of  intravenous contrast. CONTRAST:  19m ISOVUE-300 IOPAMIDOL (ISOVUE-300) INJECTION 61% COMPARISON:  CT chest December 05, 2016 FINDINGS: CT HEAD FINDINGS BRAIN: The ventricles and sulci are normal for age. No intraparenchymal hemorrhage, mass effect nor midline shift. Patchy supratentorial white matter hypodensities less than expected for patient's age, though non-specific are most compatible with chronic small vessel ischemic disease. 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 No acute large vascular territory infarcts. No abnormal extra-axial fluid collections. Basal cisterns are patent. VASCULAR: Moderate calcific atherosclerosis of the carotid siphons. SKULL: No skull fracture. Scattered subcentimeter calvarial lytic lesions. No significant scalp soft tissue swelling. SINUSES/ORBITS: The mastoid air-cells and included paranasal sinuses are well-aerated.The included ocular globes and orbital contents are non-suspicious. OTHER: None. CT NECK FINDINGS PHARYNX AND LARYNX: Slight asymmetric fullness LEFT base of tongue without discrete nodule. Aerodigestive tract is otherwise unremarkable. SALIVARY GLANDS: Mildly edematous bilateral submandibular glands without sialolith, ductal dilatation or mass. THYROID: Normal. LYMPH NODES: Bilateral supraclavicular lymphadenopathy measuring up to 3 cm. Bulky level 6 and 7 lymph nodes measure up to 19 mm short access. LEFT retropectoral lymphadenopathy. VASCULAR: Mass-effect on RIGHT internal jugular vein which remains patent. Lymphadenopathy encases and narrows the brachiocephalic confluence, veins are patent. Patent superior vena cava. Severe calcific atherosclerosis of the carotid bifurcations with suspected hemodynamically significant stenosis. SKELETON: Degenerative cervical spine including severe LEFT facet arthropathy without acute osseous process. Mild chronic appearing T4 compression fracture. OTHER: RIGHT neck skin thickening. Trace effusions in the neck with subcutaneous fat  stranding. No drainable fluid collections. IMPRESSION: CT HEAD: Calvarial lesions suspicious for metastasis in the setting of neoplasm, less likely multiple hemangiomas. 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 CT NECK: Bulky lower neck lymphadenopathy highly concerning for lymphoproliferative disorder/lymphoma versus metastatic disease. Mild neck edema. Atherosclerosis and suspected severe stenosis bilateral internal carotid arteries which would be better characterized on CTA NECK. Patent venous structures including superior vena cava. Electronically Signed   By: CElon AlasM.D.   On: 12/06/2016 21:30   Ct Soft Tissue Neck W Wo Contrast  Result Date: 12/06/2016 CLINICAL DATA:  Productive cough for several weeks. Massive adenopathy noted on recent chest CT with possible superior vena cava compression. Follow-up evaluation. EXAM: CT HEAD WITHOUT AND WITH CONTRAST CT NECK WITH CONTRAST TECHNIQUE: Contiguous axial images were obtained from the base of the skull through the vertex without and with intravenous contrast. Multidetector CT imaging of the and neck was performed using the standard protocol following the bolus administration of intravenous contrast. CONTRAST:  747mISOVUE-300 IOPAMIDOL (ISOVUE-300) INJECTION 61% COMPARISON:  CT chest December 05, 2016 FINDINGS: CT HEAD FINDINGS BRAIN: The ventricles and sulci are normal for age. No intraparenchymal hemorrhage, mass effect nor midline shift. Patchy supratentorial white matter hypodensities less than expected for patient's age, though non-specific are most compatible with chronic small vessel ischemic disease. 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 No acute large vascular territory infarcts. No abnormal extra-axial fluid collections. Basal cisterns are patent. VASCULAR: Moderate calcific atherosclerosis of the carotid siphons. SKULL: No skull fracture. Scattered subcentimeter calvarial lytic  lesions. No significant scalp soft tissue swelling. SINUSES/ORBITS: The mastoid air-cells and included paranasal sinuses are well-aerated.The included ocular globes and orbital contents are non-suspicious. OTHER: None. CT NECK FINDINGS PHARYNX AND LARYNX: Slight asymmetric fullness LEFT base of tongue without discrete nodule. Aerodigestive tract is otherwise unremarkable. SALIVARY GLANDS: Mildly edematous bilateral submandibular glands without sialolith, ductal dilatation or mass. THYROID: Normal. LYMPH NODES: Bilateral supraclavicular lymphadenopathy measuring up to 3 cm. Bulky level 6 and 7 lymph nodes measure up to 19 mm short access. LEFT retropectoral lymphadenopathy. VASCULAR: Mass-effect on RIGHT internal jugular vein which remains patent. Lymphadenopathy encases and narrows the brachiocephalic confluence, veins are patent. Patent superior vena cava. Severe calcific atherosclerosis of the carotid bifurcations with suspected hemodynamically significant stenosis. SKELETON: Degenerative cervical spine including severe LEFT facet arthropathy without acute osseous process. Mild chronic appearing T4 compression fracture. OTHER: RIGHT neck skin thickening. Trace effusions in the neck with subcutaneous fat stranding. No drainable fluid collections. IMPRESSION: CT HEAD: Calvarial lesions suspicious for metastasis in the setting of neoplasm, less likely multiple hemangiomas. 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 CT NECK: Bulky lower neck lymphadenopathy highly concerning for lymphoproliferative disorder/lymphoma versus metastatic disease. Mild neck edema. Atherosclerosis and suspected severe stenosis bilateral internal carotid arteries which would be better characterized on CTA NECK. Patent venous structures including superior vena cava. Electronically Signed   By: Elon Alas M.D.   On: 12/06/2016 21:30   US Biopsy  Result Date: 12/08/2016 CLINICAL  DATA:  Bulky neck and mediastinal adenopathy of uncertain etiology. Negative pleural fluid cytology. EXAM: ULTRASOUND GUIDED CORE BIOPSY OF RIGHT SUPRACLAVICULAR ADENOPATHY MEDICATIONS: Intravenous Fentanyl and Versed were administered as conscious sedation during continuous monitoring of the patient's level of consciousness and physiological / cardiorespiratory status by the radiology RN, with a total moderate sedation time of 10 minutes. PROCEDURE: The procedure, risks, benefits, and alternatives were explained to the patient. Questions regarding the procedure were encouraged and answered. The patient understands and consents to the procedure. Survey ultrasound of the right supraclavicular region was obtained. The adenopathy was localized and appropriate skin entry site determined and marked. The operative field was prepped with chlorhexidine in a sterile fashion, and a sterile drape was applied covering the operative field. A sterile gown and sterile gloves were used for the procedure. Local anesthesia was provided with 1% Lidocaine. Under real-time ultrasound guidance, a 17 gauge trocar needle was advanced to the margin of the lesion. Once needle tip position was confirmed, coaxial 18-gauge core biopsy samples were obtained, submitted in saline to surgical pathology. The guide needle was removed. Postprocedure scans show no hematoma. COMPLICATIONS: None. FINDINGS: Right supraclavicular adenopathy was again localized. Representative core biopsy samples obtained as above. IMPRESSION: 1. Technically successful ultrasound-guided core biopsy, right supraclavicular adenopathy Electronically Signed   By: Lucrezia Europe M.D.   On: 12/08/2016 13:20   US Thoracentesis Asp Pleural Space W/img Guide  Result Date: 12/07/2016 CLINICAL DATA:  Massive adenopathy in the low neck and chest, areaof soft tissue fullness in the right infrahilar region, just lateralto the origin of the right middle lobe bronchus. Right-sided pleural  effusion with right middle lobe partialcollapse. Probable osseous metastasis, most apparent at T8. EXAM: EXAM THORACENTESIS WITH ULTRASOUND TECHNIQUE: The procedure, risks (including but not limited to bleeding, infection, organ damage ), benefits, and alternatives were explained to the patient. Questions regarding the procedure were encouraged and answered. The patient understands and consents to the procedure. Survey ultrasound of the  right hemithorax was performed and an appropriate skin entry site was localized. Site was marked, prepped with chlorhexidine , draped in usual sterile fashion, infiltrated locally with 1% lidocaine. The Saf-T-Centesis needle was advanced into the pleural space. Clear amber fluid returned. 1.45 L was removed. Post procedure imaging shows no residual fluid. The patient tolerated procedure well. COMPLICATIONS: COMPLICATIONS None immediate IMPRESSION: Technically successful ultrasound-guided right thoracentesis. Follow-up chest radiograph shows no pneumothorax. Electronically Signed   By: Lucrezia Europe M.D.   On: 12/07/2016 16:16   ASSESSMENT AND PLAN:  Joi Leyva  is a 83 y.o. female with a known history of A. fib, carotid stenosis, CKD and hypertension. The patient has had exertional dyspnea for several months, which has been worsening progressively for the past a few weeks. She developed severe dry cough and was treated 10 with Z-Pak 10 days ago without improvement  * *Hypotension: holding all BP meds, can try fluid trial. Transfer to ICU-stepdown for close monitoring. - May need pressors - d/w Dr Mortimer Fries  * New diagnosis of Lung mass: With possible  metastasis to liver and T8 and right-sided pleural effusion - Worrisome for possible lymphoma - Appreciate oncology Dr Rogue Bussing input - h/o heavy tobacco abuse - s/p ultrasound-guided thoracentesis - fluid didn't show any malignant cells and IR guided neck node biopsy today, waiting for results and onco d/w family  *  Hyponatremia: Improving with normal saline IV and follow-up BMP. - Possible SIADH with lung mass - Na 131  *Chronic A. Fib: resume Coumadin (was on hold for biopsy & thoracentesis) and follow-up INR 1.23. Continue Coreg.      Case discussed with Care Management/Social Worker. Management plans discussed with the patient, nursing and they are in agreement.  CODE STATUS: full, palliative care following  DVT Prophylaxis: SCD, also on Coumadin  TOTAL TIME TAKING CARE OF THIS PATIENT: 25 minutes.  >50% time spent on counselling and coordination of care  POSSIBLE D/C IN am, DEPENDING ON CLINICAL CONDITION.  And oncology evaluation  Note: This dictation was prepared with Dragon dictation along with smaller phrase technology. Any transcriptional errors that result from this process are unintentional.  Max Sane M.D on 12/08/2016 at 3:42 PM  Between 7am to 6pm - Pager - [phone]  After 6pm go to [email or web address] - password [email or web address] [facility]  Office  [phone]  CC: Primary care physician; Einar Pheasant, MD

## 2016-12-08 NOTE — Progress Notes (Signed)
[place] CONSULT NOTE  Patient Care Team: Einar Pheasant, MD as PCP - General (Internal Medicine) Einar Pheasant, MD (Internal Medicine) Robert Bellow, MD (General Surgery) Minna Merritts, MD as Consulting Physician (Cardiology)   CC; patient's shortness of breath is improved post left-sided thoracentesis. Since cytology was negative patient underwent neck lymph node biopsy this afternoon. Denies any significant pain. Denies any worsening cough.  Patient noted to have drop in her blood pressure to systolic 71I- currently getting IV fluid bolus..   ROS: Otherwise denies any chest pain or Headaches.  MEDICAL HISTORY:  Past Medical History:  Diagnosis Date  . Atrial fibrillation ([place])   . Carotid stenosis   . Cholelithiasis   . Chronic kidney disease   . Chronic low back pain   . Gallstone pancreatitis 11-10-14  . Gallstones   . GERD (gastroesophageal reflux disease)   . Hyperlipidemia   . Hypertension   . Impaired fasting glucose   . Osteoarthritis     SURGICAL HISTORY: Past Surgical History:  Procedure Laterality Date  . APPENDECTOMY    . CATARACT EXTRACTION     x2  . CHOLECYSTECTOMY  11-10-14  . CHOLECYSTECTOMY      SOCIAL HISTORY: Long-standing smoking quit approximately.year ago. Used to work Surveyor, [employer] facility.  She lives by herself.   no alcohol.  Social History   Social History  . Marital status: Widowed    Spouse name: N/A  . Number of children: N/A  . Years of education: N/A   Occupational History  . Not on file.   Social History Main Topics  . Smoking status: Former Smoker    Packs/day: 0.50    Types: Cigarettes    Quit date: 11/04/2014  . Smokeless tobacco: Never Used     Comment: pt stopped two weeks ago  . Alcohol use 0.0 oz/week     Comment: occasional  . Drug use: No  . Sexual activity: Not on file   Other Topics Concern  . Not on file   Social History Narrative  . No narrative on file    FAMILY  HISTORY: Family History  Problem Relation Age of Onset  . Heart attack Father 54  . Atrial fibrillation Sister   . Atrial fibrillation Brother   . Heart failure Sister 56  . Stroke Sister     ALLERGIES:  has No Known Allergies.  MEDICATIONS:  Current Facility-Administered Medications  Medication Dose Route Frequency Provider Last Rate Last Dose  . 0.9 %  sodium chloride infusion   Intravenous Continuous Arne Cleveland, MD 10 mL/hr at 12/08/16 1151    . acetaminophen (TYLENOL) tablet 650 mg  650 mg Oral Q6H PRN Demetrios Loll, MD       Or  . acetaminophen (TYLENOL) suppository 650 mg  650 mg Rectal Q6H PRN Demetrios Loll, MD      . albuterol (PROVENTIL) (2.5 MG/3ML) 0.083% nebulizer solution 2.5 mg  2.5 mg Nebulization Q2H PRN Demetrios Loll, MD      . bisacodyl (DULCOLAX) EC tablet 5 mg  5 mg Oral Daily PRN Demetrios Loll, MD      . calcium carbonate (TUMS - dosed in mg elemental calcium) chewable tablet 200 mg of elemental calcium  1 tablet Oral TID PRN Max Sane, MD   200 mg of elemental calcium at 12/07/16 2016  . clonazePAM (KLONOPIN) tablet 0.25 mg  0.25 mg Oral Daily PRN Demetrios Loll, MD   0.25 mg at 12/08/16 1058  . fentaNYL (  SUBLIMAZE) 100 MCG/2ML injection           . heparin lock flush 100 unit/mL  500 Units Intracatheter Daily PRN Cammie Sickle, MD      . heparin lock flush 100 unit/mL  250 Units Intracatheter PRN Cammie Sickle, MD      . HYDROcodone-acetaminophen (NORCO/VICODIN) 5-325 MG per tablet 1-2 tablet  1-2 tablet Oral Q4H PRN Demetrios Loll, MD      . ketorolac (TORADOL) 15 MG/ML injection 15 mg  15 mg Intravenous Q6H PRN Demetrios Loll, MD      . midazolam (VERSED) 5 MG/5ML injection           . ondansetron (ZOFRAN) tablet 4 mg  4 mg Oral Q6H PRN Demetrios Loll, MD       Or  . ondansetron [facility]) injection 4 mg  4 mg Intravenous Q6H PRN Demetrios Loll, MD      . pantoprazole (PROTONIX) EC tablet 40 mg  40 mg Oral Daily Demetrios Loll, MD   40 mg at 12/08/16 1054  . senna-docusate (Senokot-S)  tablet 1 tablet  1 tablet Oral QHS PRN Demetrios Loll, MD      . sodium chloride 0.9 % bolus 250 mL  250 mL Intravenous Once Awilda Bill, NP   250 mL at 12/08/16 1700  . sodium chloride flush (NS) 0.9 % injection 10 mL  10 mL Intracatheter PRN Cammie Sickle, MD      . sodium chloride flush (NS) 0.9 % injection 3 mL  3 mL Intracatheter PRN Cammie Sickle, MD      . Warfarin - Pharmacist Dosing Inpatient   Does not apply [id number] Demetrios Loll, MD          .  PHYSICAL EXAMINATION:  Vitals:   12/08/16 1634 12/08/16 1725  BP: (!) 103/56 118/63  Pulse: 88 95  Resp: 18   Temp:     Filed Weights   12/05/16 1144  Weight: 155 lb (70.3 kg)    GENERAL: Well-nourished well-developed; Alert, no distress and comfortable. Accompanied by her daughter.  EYES: no pallor or icterus OROPHARYNX: no thrush or ulceration. NECK: supple.  LYMPH:   3-4 cm supraclavicular lymph nodes felt.  LUNGS: decreased breath sounds to auscultation on the right side compared to the left  at bases and  No wheeze or crackles HEART/CVS: regular rate & rhythm and no murmurs; No lower extremity edema ABDOMEN: abdomen soft, non-tender and normal bowel sounds Musculoskeletal:no cyanosis of digits and no clubbing  PSYCH: alert & oriented x 3 with fluent speech NEURO: no focal motor/sensory deficits SKIN:  no rashes or significant lesions; multiple chronic ecchymosis noted.  LABORATORY DATA:  I have reviewed the data as listed Lab Results  Component Value Date   WBC 4.1 12/08/2016   HGB 11.5 (L) 12/08/2016   HCT 31.6 (L) 12/08/2016   MCV 93.6 12/08/2016   PLT 122 (L) 12/08/2016    Recent Labs  03/23/16 0806 09/06/16 0807  12/05/16 1145 12/06/16 0547 12/07/16 0518 12/08/16 0335  NA 139 137  --  130* 130* 130* 131*  K 4.8 5.1  --  4.8 4.1 3.6 4.1  CL 104 102  --  98* 97* 94* 95*  CO2 29 28  --  '26 25 28 28  '$ GLUCOSE 111* 143*  --  131* 103* 100* 91  BUN 13 16  --  '12 10 9 9  '$ CREATININE 0.90 1.09  --   1.02* 0.76 0.81 0.86  CALCIUM 10.2 10.3  --  9.5 9.1 9.2 9.2  GFRNONAA  --   --   < > 48* >60 >60 59*  GFRAA  --   --   < > 56* >60 >60 >60  PROT 6.4 7.0  --  6.8  --   --   --   ALBUMIN 4.2 4.2  --  3.8  --   --   --   AST 19 15  --  32  --   --   --   ALT 15 14  --  14  --   --   --   ALKPHOS 55 116  --  67  --   --   --   BILITOT 1.4* 1.1  --  1.2  --   --   --   BILIDIR 0.3 0.2  --   --   --   --   --   < > = values in this interval not displayed.  RADIOGRAPHIC STUDIES: I have personally reviewed the radiological images as listed and agreed with the findings in the report. Dg Chest 1 View  Result Date: 12/07/2016 CLINICAL DATA:  S/p thoracentesis  Pleural effusion right EXAM: CHEST  1 VIEW COMPARISON:  12/05/2016 FINDINGS: Interval resolution of the previously noted right pleural effusion. No pneumothorax. Improved aeration of the right lung base. Heart size upper limits normal.  Atheromatous aorta. Mild thoracolumbar scoliosis. Cholecystectomy clips. IMPRESSION: 1. Resolution of right pleural effusion post thoracentesis, with no pneumothorax. Electronically Signed   By: Lucrezia Europe M.D.   On: 12/07/2016 16:14   Dg Chest 2 View  Result Date: 12/05/2016 CLINICAL DATA:  One month of shortness of breath. History of atrial fibrillation, aortic stenosis, discontinued smoking 2 years ago. EXAM: CHEST  2 VIEW COMPARISON:  PA and lateral chest x-ray of November 17, 2016 FINDINGS: Since the previous study these right-sided pleural effusion has increased in volume. The right hemidiaphragm is obscured. The left lung is well-expanded. There is a trace of pleural fluid on the left. The heart is top-normal in size. The pulmonary vascularity is mildly prominent. There is soft tissue fullness in the right hilar region more conspicuous than in the past. There is mitral annular calcification. There is calcification in the wall of the thoracic aorta. IMPRESSION: COPD. Increased volume of the right-sided pleural  effusion which now occupies approximately 1/3 of the pleural space volume. This may be obscuring a basilar pneumonia or neoplasm. Persistent soft tissue fullness in the right hilar region new since December 2015. Mild CHF.  Thoracic aortic atherosclerosis. Chest CT scanning is recommended to further evaluate the right hilum and right lower hemithorax. Electronically Signed   By: David  Martinique M.D.   On: 12/05/2016 12:42   Dg Chest 2 View  Result Date: 11/17/2016 CLINICAL DATA:  Wheezing EXAM: CHEST  2 VIEW COMPARISON:  11/10/2014 FINDINGS: Bilateral interstitial thickening. Right lower lobe airspace disease. Small right pleural effusion. No left pleural effusion. No pneumothorax. Stable cardiomediastinal silhouette. Thoracic aortic atherosclerosis. No acute osseous abnormality. IMPRESSION: Small right pleural effusion and right lower lobe airspace disease concerning for atelectasis versus pneumonia. Electronically Signed   By: Kathreen Devoid   On: 11/17/2016 12:39   Ct Head W & Wo Contrast  Result Date: 12/06/2016 CLINICAL DATA:  Productive cough for several weeks. Massive adenopathy noted on recent chest CT with possible superior vena cava compression. Follow-up evaluation. EXAM: CT HEAD WITHOUT AND WITH CONTRAST CT NECK WITH CONTRAST TECHNIQUE:  Contiguous axial images were obtained from the base of the skull through the vertex without and with intravenous contrast. Multidetector CT imaging of the and neck was performed using the standard protocol following the bolus administration of intravenous contrast. CONTRAST:  31m ISOVUE-300 IOPAMIDOL (ISOVUE-300) INJECTION 61% COMPARISON:  CT chest December 05, 2016 FINDINGS: CT HEAD FINDINGS BRAIN: The ventricles and sulci are normal for age. No intraparenchymal hemorrhage, mass effect nor midline shift. Patchy supratentorial white matter hypodensities less than expected for patient's age, though non-specific are most compatible with chronic small vessel ischemic  disease. 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 No acute large vascular territory infarcts. No abnormal extra-axial fluid collections. Basal cisterns are patent. VASCULAR: Moderate calcific atherosclerosis of the carotid siphons. SKULL: No skull fracture. Scattered subcentimeter calvarial lytic lesions. No significant scalp soft tissue swelling. SINUSES/ORBITS: The mastoid air-cells and included paranasal sinuses are well-aerated.The included ocular globes and orbital contents are non-suspicious. OTHER: None. CT NECK FINDINGS PHARYNX AND LARYNX: Slight asymmetric fullness LEFT base of tongue without discrete nodule. Aerodigestive tract is otherwise unremarkable. SALIVARY GLANDS: Mildly edematous bilateral submandibular glands without sialolith, ductal dilatation or mass. THYROID: Normal. LYMPH NODES: Bilateral supraclavicular lymphadenopathy measuring up to 3 cm. Bulky level 6 and 7 lymph nodes measure up to 19 mm short access. LEFT retropectoral lymphadenopathy. VASCULAR: Mass-effect on RIGHT internal jugular vein which remains patent. Lymphadenopathy encases and narrows the brachiocephalic confluence, veins are patent. Patent superior vena cava. Severe calcific atherosclerosis of the carotid bifurcations with suspected hemodynamically significant stenosis. SKELETON: Degenerative cervical spine including severe LEFT facet arthropathy without acute osseous process. Mild chronic appearing T4 compression fracture. OTHER: RIGHT neck skin thickening. Trace effusions in the neck with subcutaneous fat stranding. No drainable fluid collections. IMPRESSION: CT HEAD: Calvarial lesions suspicious for metastasis in the setting of neoplasm, less likely multiple hemangiomas. 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 CT NECK: Bulky lower neck lymphadenopathy highly concerning for lymphoproliferative disorder/lymphoma versus metastatic disease. Mild neck edema. Atherosclerosis  and suspected severe stenosis bilateral internal carotid arteries which would be better characterized on CTA NECK. Patent venous structures including superior vena cava. Electronically Signed   By: CElon AlasM.D.   On: 12/06/2016 21:30   Ct Soft Tissue Neck W Wo Contrast  Result Date: 12/06/2016 CLINICAL DATA:  Productive cough for several weeks. Massive adenopathy noted on recent chest CT with possible superior vena cava compression. Follow-up evaluation. EXAM: CT HEAD WITHOUT AND WITH CONTRAST CT NECK WITH CONTRAST TECHNIQUE: Contiguous axial images were obtained from the base of the skull through the vertex without and with intravenous contrast. Multidetector CT imaging of the and neck was performed using the standard protocol following the bolus administration of intravenous contrast. CONTRAST:  755mISOVUE-300 IOPAMIDOL (ISOVUE-300) INJECTION 61% COMPARISON:  CT chest December 05, 2016 FINDINGS: CT HEAD FINDINGS BRAIN: The ventricles and sulci are normal for age. No intraparenchymal hemorrhage, mass effect nor midline shift. Patchy supratentorial white matter hypodensities less than expected for patient's age, though non-specific are most compatible with chronic small vessel ischemic disease. 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 No acute large vascular territory infarcts. No abnormal extra-axial fluid collections. Basal cisterns are patent. VASCULAR: Moderate calcific atherosclerosis of the carotid siphons. SKULL: No skull fracture. Scattered subcentimeter calvarial lytic lesions. No significant scalp soft tissue swelling. SINUSES/ORBITS: The mastoid air-cells and included paranasal sinuses are well-aerated.The included ocular globes and orbital contents are non-suspicious. OTHER: None. CT NECK FINDINGS PHARYNX AND LARYNX: Slight  asymmetric fullness LEFT base of tongue without discrete nodule. Aerodigestive tract is otherwise unremarkable. SALIVARY GLANDS: Mildly edematous bilateral submandibular  glands without sialolith, ductal dilatation or mass. THYROID: Normal. LYMPH NODES: Bilateral supraclavicular lymphadenopathy measuring up to 3 cm. Bulky level 6 and 7 lymph nodes measure up to 19 mm short access. LEFT retropectoral lymphadenopathy. VASCULAR: Mass-effect on RIGHT internal jugular vein which remains patent. Lymphadenopathy encases and narrows the brachiocephalic confluence, veins are patent. Patent superior vena cava. Severe calcific atherosclerosis of the carotid bifurcations with suspected hemodynamically significant stenosis. SKELETON: Degenerative cervical spine including severe LEFT facet arthropathy without acute osseous process. Mild chronic appearing T4 compression fracture. OTHER: RIGHT neck skin thickening. Trace effusions in the neck with subcutaneous fat stranding. No drainable fluid collections. IMPRESSION: CT HEAD: Calvarial lesions suspicious for metastasis in the setting of neoplasm, less likely multiple hemangiomas. 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 CT NECK: Bulky lower neck lymphadenopathy highly concerning for lymphoproliferative disorder/lymphoma versus metastatic disease. Mild neck edema. Atherosclerosis and suspected severe stenosis bilateral internal carotid arteries which would be better characterized on CTA NECK. Patent venous structures including superior vena cava. Electronically Signed   By: Elon Alas M.D.   On: 12/06/2016 21:30   Ct Chest W Contrast  Result Date: 12/05/2016 CLINICAL DATA:  Plain films demonstrating right-sided airspace disease and suggestion of right hilar soft tissue fullness. Shortness of breath for 1 month. EXAM: CT CHEST WITH CONTRAST TECHNIQUE: Multidetector CT imaging of the chest was performed during intravenous contrast administration. CONTRAST:  34m ISOVUE-300 IOPAMIDOL (ISOVUE-300) INJECTION 61% COMPARISON:  Multiple plain films, most recent 12/05/2016. No prior CTs for  comparison. FINDINGS: Cardiovascular: Advanced aortic and branch vessel atherosclerosis. Moderate cardiomegaly with no pericardial effusion. Multivessel coronary artery atherosclerosis. No central pulmonary embolism, on this non-dedicated study. Narrowing of the confluence of the brachiocephalic veins and less so the superior aspect of the SVC. Example image 43/series 2. Mediastinum/Nodes: Massive bilateral supraclavicular adenopathy, including at 3.2 cm in the left supraclavicular space. Marked mediastinal adenopathy, with a right paratracheal node measuring 3.4 cm on image 54/series 2. Subcarinal node measures 2.5 cm on image 75/series 2. Right hilar adenopathy at 2.5 cm. Moderate hiatal hernia.  Anterior mediastinal nodal mass at 2.6 cm. Lungs/Pleura: Small to moderate right-sided pleural effusion, without well-defined pleural mass Minimal motion degradation. Patent airways with mass-effect upon right lower and middle lobe bronchi. Right middle lobe partial collapse. Dependent right lower lobe airspace disease. Soft tissue mass centered at the lateral aspect of the origin of the right middle lobe bronchus could be pulmonary parenchymal or nodal. Measures 2.9 x 3.9 cm on image 79/series 2. Upper Abdomen: Cholecystectomy. Pneumobilia. Hypoattenuating lateral segment left liver lobe lesion is 1.5 cm and represent a cyst or complex cyst on image 129/series 2. Heterogeneous enhancement involving the remainder of the liver is subtle. Normal imaged portions of the spleen, pancreas, kidneys. Normal imaged adrenal glands. Abdominal aortic atherosclerosis. No upper abdominal adenopathy. Musculoskeletal: Left axillary adenopathy, include at 1.6 cm on image 33/series 2. Osteopenia. Mildly heterogeneous marrow density, especially involving the left side of T8 vertebral body. IMPRESSION: 1. Massive adenopathy in the low neck and chest, most consistent with neoplasm. Considerations include small-cell lung cancer or lymphoma.  The only potential side of primary in the lungs is an area of soft tissue fullness in the right infrahilar region, just lateral to the origin of the right middle lobe bronchus. This could represent the primary pulmonary  mass or adenopathy. 2. Right-sided pleural effusion with right middle lobe partial collapse 3. Heterogeneous hepatic density is nonspecific. Metastatic disease cannot be excluded. The largest hypoattenuating area is relatively low density and could represent a cyst or complex cyst. Recommend attention on follow-up. 4. Probable osseous metastasis, most apparent at T8. 5. Consider further evaluation with nonemergent PET and/or thoracic surgery/oncology consultation. 6. Given the extent of upper thoracic adenopathy, the patient may be predisposed to SVC syndrome. Electronically Signed   By: Abigail Miyamoto M.D.   On: 12/05/2016 17:38   US Biopsy  Result Date: 12/08/2016 CLINICAL DATA:  Bulky neck and mediastinal adenopathy of uncertain etiology. Negative pleural fluid cytology. EXAM: ULTRASOUND GUIDED CORE BIOPSY OF RIGHT SUPRACLAVICULAR ADENOPATHY MEDICATIONS: Intravenous Fentanyl and Versed were administered as conscious sedation during continuous monitoring of the patient's level of consciousness and physiological / cardiorespiratory status by the radiology RN, with a total moderate sedation time of 10 minutes. PROCEDURE: The procedure, risks, benefits, and alternatives were explained to the patient. Questions regarding the procedure were encouraged and answered. The patient understands and consents to the procedure. Survey ultrasound of the right supraclavicular region was obtained. The adenopathy was localized and appropriate skin entry site determined and marked. The operative field was prepped with chlorhexidine in a sterile fashion, and a sterile drape was applied covering the operative field. A sterile gown and sterile gloves were used for the procedure. Local anesthesia was provided with 1%  Lidocaine. Under real-time ultrasound guidance, a 17 gauge trocar needle was advanced to the margin of the lesion. Once needle tip position was confirmed, coaxial 18-gauge core biopsy samples were obtained, submitted in saline to surgical pathology. The guide needle was removed. Postprocedure scans show no hematoma. COMPLICATIONS: None. FINDINGS: Right supraclavicular adenopathy was again localized. Representative core biopsy samples obtained as above. IMPRESSION: 1. Technically successful ultrasound-guided core biopsy, right supraclavicular adenopathy Electronically Signed   By: Lucrezia Europe M.D.   On: 12/08/2016 13:20   US Thoracentesis Asp Pleural Space W/img Guide  Result Date: 12/07/2016 CLINICAL DATA:  Massive adenopathy in the low neck and chest, areaof soft tissue fullness in the right infrahilar region, just lateralto the origin of the right middle lobe bronchus. Right-sided pleural effusion with right middle lobe partialcollapse. Probable osseous metastasis, most apparent at T8. EXAM: EXAM THORACENTESIS WITH ULTRASOUND TECHNIQUE: The procedure, risks (including but not limited to bleeding, infection, organ damage ), benefits, and alternatives were explained to the patient. Questions regarding the procedure were encouraged and answered. The patient understands and consents to the procedure. Survey ultrasound of the right hemithorax was performed and an appropriate skin entry site was localized. Site was marked, prepped with chlorhexidine , draped in usual sterile fashion, infiltrated locally with 1% lidocaine. The Saf-T-Centesis needle was advanced into the pleural space. Clear amber fluid returned. 1.45 L was removed. Post procedure imaging shows no residual fluid. The patient tolerated procedure well. COMPLICATIONS: COMPLICATIONS None immediate IMPRESSION: Technically successful ultrasound-guided right thoracentesis. Follow-up chest radiograph shows no pneumothorax. Electronically Signed   By: Lucrezia Europe M.D.   On: 12/07/2016 16:16    ASSESSMENT & PLAN:   # 83 year old female patient currently admitted to hospital for worsening shortness of breath cough- CT scan-  Bilateral supraclavicular adenopathy; mediastinal adenopathy; right pleural effusion.  # bilateral supraclavicular adenopathy/ mediastinal adenopathy/right pleural effusion- neck LN Bx-positive for malignancy [discussed with Dr.Rubinas]; the differential diagnosis includes small cell lung cancer [most likely]  Vs.  Lymphoma. Given the aggressive appearance of  the malignancy this is likely incurable. Patient not interested in any chemotherapy.  # right-sided pleural effusion- status post thoracentesis. Improvement in her respiratory status. Negative for malignant cells.  # Hypertension question secondary to medication- currently getting fluids. Reviewed the pulmonary note.   # the above plan of care was discussed with the patient and daughter in detail. Discussed that she could follow-up with me in the [facility] in [place] on January 30th. Patient/ daughter agree.  #No obvious evidence of superior vena cava obstruction on the neck CT scan. Brain CT negative for any metastasis. I reviewed the blood work- with the patient in detail; also reviewed the imaging independently [as summarized above]; and with the patient in detail.    # 40 minutes face-to-face with the patient discussing the above plan of care; more than 50% of time spent on prognosis/ natural history; counseling and coordination.     Cammie Sickle, MD 12/08/2016 5:56 PM

## 2016-12-08 NOTE — Progress Notes (Signed)
Pt received  250 ml bolus of fld. B/p trending up  127/61 last check.  Hr 81.  moniter a fib  102.  Marda Stalker NP came to see pt  Earlier.no resp distress no c/o sob.  No c/o chest pain.  No further   Lightheadedness noted

## 2016-12-08 NOTE — Progress Notes (Signed)
Called [facility] to give report  On pt. Bed not ready yet.  Pam rn  will call when bed ready.

## 2016-12-08 NOTE — Procedures (Signed)
[place] core biopsy R supraclav LAN 18g x4 in saline to surg path No complication No blood loss. See complete dictation in [facility].

## 2016-12-08 NOTE — Progress Notes (Signed)
ANTICOAGULATION CONSULT NOTE - Follow Up Consult  Pharmacy Consult for Warfarin Indication: atrial fibrillation  No Known Allergies  Patient Measurements: Height: '5\' 6"'$  (167.6 cm) Weight: 155 lb (70.3 kg) IBW/kg (Calculated) : 59.3 Heparin Dosing Weight:    Vital Signs: Temp: 98.1 F (36.7 C) (01/26 0700) Temp Source: Oral (01/26 0700) BP: 123/65 (01/26 0830) Pulse Rate: 83 (01/26 0830)  Labs:  Recent Labs  12/05/16 1657 12/06/16 0547 12/07/16 0518 12/08/16 0335  HGB 12.2  --  10.9* 11.5*  HCT 34.0*  --  30.0* 31.6*  PLT 131*  --  115* 122*  APTT 50*  --   --   --   LABPROT 35.9* 34.9* 19.0* 16.1*  INR 3.49 3.37 1.58 1.28  CREATININE  --  0.76 0.81 0.86  TROPONINI <0.03  --   --   --     Estimated Creatinine Clearance: 44 mL/min (by C-G formula based on SCr of 0.86 mg/dL).   Medications:  Prescriptions Prior to Admission  Medication Sig Dispense Refill Last Dose  . albuterol (PROVENTIL HFA;VENTOLIN HFA) 108 (90 Base) MCG/ACT inhaler Inhale 2 puffs into the lungs every 6 (six) hours as needed for wheezing or shortness of breath. 1 Inhaler 3 prn at prn  . carvedilol (COREG) 25 MG tablet TAKE 1 TABLET (25 MG TOTAL) BY MOUTH 2 (TWO) TIMES DAILY WITH A MEAL. (Patient taking differently: TAKE 1/2 TABLET (12.5 MG TOTAL) BY MOUTH 2 (TWO) TIMES DAILY WITH A MEAL.) 180 tablet 1 12/05/2016 at 0900  . clonazePAM (KLONOPIN) 0.5 MG tablet TAKE 1/2 TO 1 TABLET BY MOUTH ONCE DAILY AS NEEDED 30 tablet 0 prn at prn  . hydrALAZINE (APRESOLINE) 10 MG tablet TAKE 1 TABLET BY MOUTH 3 TIMES A DAY 90 tablet 5 Past Week at Unknown time  . losartan (COZAAR) 100 MG tablet TAKE 1 TABLET BY MOUTH DAILY. 90 tablet 3 12/05/2016 at 0900  . omeprazole (PRILOSEC) 20 MG capsule Take 20 mg by mouth daily.   12/05/2016 at 0900  . pravastatin (PRAVACHOL) 40 MG tablet TAKE 1 TABLET (40 MG TOTAL) BY MOUTH DAILY. 90 tablet 3 12/05/2016 at 0900  . warfarin (COUMADIN) 5 MG tablet TAKE AS DIRECTED BY  ANTICOAGULATION CLINIC (Patient taking differently: 1/2 tablet daily) 30 tablet 3 12/04/2016 at 2000  . warfarin (COUMADIN) 5 MG tablet Take 2.5 mg by mouth daily.   12/04/2016 at 2000  . azithromycin (ZITHROMAX) 250 MG tablet Take two tablets x 1 day and then one tablet per day for four more days. (Patient not taking: Reported on 12/05/2016) 6 tablet 0 Not Taking at Unknown time    Assessment: Pt is a 83 year old female with lung cancer and afbi on warfarin at home. Pt INR on admission is 3.49. Home dose is 2.'5mg'$  daily. Of note INR was also high on 1/3 at anticoag visit and was instructed to hold warfarin x 1 and then resume dose. Pt on azithromycin from 1/8-1/12. Per MD note patient may have a biopsy or thoracentesis.  1/23 INR 3.49, no warfarin 1/24 INR 3.37; no warfarin  1/25: INR: 1.58; 3 mg  1/26: INR: 1.28  Goal of Therapy:  INR 2-3   Plan:  Will give warfarin 3 mg PO x 1. Will recheck INR in am.   Larene Beach, PharmD  12/08/2016 10:38 AM

## 2016-12-08 NOTE — Progress Notes (Signed)
New referral for [facility] upon discharge from [facility]. Patient was admitted to [facility] on 1.23.14 with shortness of breath and xray shows new right lung mass, CT reveals right pleural effusion and lymphadenopathy.  Patient for biopsy today.  Patient has been evaluated by Dr. Rogue Bussing- oncology to discuss treatment options.  Patient has past medical history of Atrial fib, carotid stenosis, CKD, GERD, hyperlipidemia, Osteoarthritis, gallstones/pancreatitis, kidney stones and HTN.  Patient is widowed- she and her daughter (from [place]) participated in the goals of care discussion with palliative medicine.  Patient's focus is quality of life, but has many hard decisions to make once she receives results from her thoracentesis biopsy done today.  Home health disciplines ordered:  SN and PT.  Updated information faxed to referral intake.  Thank you for allowing participation in this patient's care.  Dimas Aguas, RN Clinical Nurse Liaison [facility]

## 2016-12-09 LAB — BASIC METABOLIC PANEL
ANION GAP: 7 (ref 5–15)
BUN: 15 mg/dL (ref 6–20)
CALCIUM: 9.3 mg/dL (ref 8.9–10.3)
CO2: 27 mmol/L (ref 22–32)
CREATININE: 0.98 mg/dL (ref 0.44–1.00)
Chloride: 100 mmol/L — ABNORMAL LOW (ref 101–111)
GFR calc Af Amer: 59 mL/min — ABNORMAL LOW (ref >60)
GFR, EST NON AFRICAN AMERICAN: 51 mL/min — AB (ref >60)
GLUCOSE: 97 mg/dL (ref 65–99)
Potassium: 3.8 mmol/L (ref 3.5–5.1)
Sodium: 134 mmol/L — ABNORMAL LOW (ref 135–145)

## 2016-12-09 LAB — CBC
HEMATOCRIT: 30.8 % — AB (ref 35.0–47.0)
Hemoglobin: 11.2 g/dL — ABNORMAL LOW (ref 12.0–16.0)
MCH: 33.4 pg (ref 26.0–34.0)
MCHC: 36.2 g/dL — AB (ref 32.0–36.0)
MCV: 92.3 fL (ref 80.0–100.0)
PLATELETS: 124 10*3/uL — AB (ref 150–440)
RBC: 3.34 MIL/uL — ABNORMAL LOW (ref 3.80–5.20)
RDW: 12.7 % (ref 11.5–14.5)
WBC: 4 10*3/uL (ref 3.6–11.0)

## 2016-12-09 LAB — PROTIME-INR
INR: 1.27
Prothrombin Time: 16 seconds — ABNORMAL HIGH (ref 11.4–15.2)

## 2016-12-09 MED ORDER — ACETAMINOPHEN 325 MG PO TABS: 650.0000 mg | ORAL_TABLET | Freq: Four times a day (QID) | ORAL | Status: AC | PRN

## 2016-12-09 MED ORDER — SENNOSIDES-DOCUSATE SODIUM 8.6-50 MG PO TABS: 1.0000 | ORAL_TABLET | Freq: Every evening | ORAL | Status: AC | PRN

## 2016-12-09 MED ORDER — WARFARIN SODIUM 5 MG PO TABS
5.0000 mg | ORAL_TABLET | Freq: Every day | ORAL | Status: DC
  Filled 2016-12-09: qty 1

## 2016-12-09 MED ORDER — WARFARIN SODIUM 5 MG PO TABS: 5.0000 mg | ORAL_TABLET | Freq: Every day | ORAL | 0 refills | Status: DC

## 2016-12-09 MED ORDER — CALCIUM CARBONATE ANTACID 500 MG PO CHEW: 1.0000 | CHEWABLE_TABLET | Freq: Three times a day (TID) | ORAL | Status: AC | PRN

## 2016-12-09 NOTE — Care Management Note (Signed)
Case Management Note  Patient Details  Name: [name] [name] MRN: [id number] Date of Birth: [birth year]  Subjective/Objective:     A referral for HH-PT, RN, Aide was faxed to Lorelle Formosa at [facility] per patient request. [name] [name] reports that she already has a RW.  No other discharge needs identified.               Action/Plan:   Expected Discharge Date:  12/09/16               Expected Discharge Plan:     In-House Referral:     Discharge planning Services     Post Acute Care Choice:    Choice offered to:     DME Arranged:    DME Agency:     HH Arranged:    HH Agency:     Status of Service:     If discussed at [employer], dates discussed:    Additional Comments:  Jeweldean Drohan A, RN 12/09/2016, 1:35 PM

## 2016-12-09 NOTE — Discharge Summary (Signed)
[facility] NAME: [name] [name]    MR#:  [id number]  DATE OF BIRTH:  1928/06/13  DATE OF ADMISSION:  12/05/2016 ADMITTING PHYSICIAN: Demetrios Loll, MD  DATE OF DISCHARGE: 12/09/16  PRIMARY CARE PHYSICIAN: Einar Pheasant, MD    ADMISSION DIAGNOSIS:  Hyponatremia [E87.1] Bone lesion [M89.9] Acute renal insufficiency [N28.9] Pleural effusion on right [J90] Hilar mass [R91.8]  DISCHARGE DIAGNOSIS:  Active Problems:   Primary malignant neoplasm of lung metastatic to other site [facility])   Palliative care by specialist   Hilar mass   Pleural effusion   SECONDARY DIAGNOSIS:   Past Medical History:  Diagnosis Date  . Atrial fibrillation ([place])   . Carotid stenosis   . Cholelithiasis   . Chronic kidney disease   . Chronic low back pain   . Gallstone pancreatitis 11-10-14  . Gallstones   . GERD (gastroesophageal reflux disease)   . Hyperlipidemia   . Hypertension   . Impaired fasting glucose   . Osteoarthritis     HOSPITAL COURSE:  History and physical [name] [name]  is a 83 y.o. female with a known history of A. fib, carotid stenosis, CKD and hypertension. The patient has had exertional dyspnea for several months, which has been worsening progressively for the past a few weeks. She developed severe dry cough and was treated 10 with Z-Pak 10 days ago without improvement. At that time, the chest x-ray showed abnormality in the right lung. She had severe dyspnea on exertion yesterday. CAT scan of the chest show lung mass, right pleural effusion and lymphadenopathy. Please review history and physical for complete details  Hospital course  *Hypotension:  Improved by holding all BP meds, continue to hold blood pressure medications until evaluated by primary care physician Status post IV fluids clinically doing much better- -Initially patient was monitored and stepdown unit clinically improved  * New diagnosis of  Lung mass with pleural effusion: With possible  metastasis to liver and T8 and right-sided pleural effusion - Worrisome for possible small cell lung cancer/ lymphoma status post lymph node biopsy in the right-sided supraclavicular area on 12/08/2016, follow-up with oncology for biopsy results on January 30 and further treatment plan of care - Appreciate oncology Dr Rogue Bussing input, recommending to follow-up with the patient as an outpatient on January 30 at [facility]. Patient and daughter are agreeable - h/o heavy tobacco abuse - s/p ultrasound-guided thoracentesis - fluid didn't show any malignant cells    * Hyponatremia:  Improved with normal saline IV  - Possible SIADH with lung mass - Na 131--134 today resolved  *Chronic A. Fib: resume Coumadin (was on hold for biopsy & thoracentesis) at 5 mg Will continue today and 28th of 2018. Repeat PT/INR on Monday, 12/11/2016 and fax results to primary care physician for further Coumadin management Today's INR is 1.27. Currently rate controlled. Continue Coreg   *PT consult recommending home health PT and rolling walker with 5 inch wheels   DISCHARGE CONDITIONS:  Stable  CONSULTS OBTAINED:  Treatment Team:  Cammie Sickle, MD   PROCEDURES Right-sided thoracocentesis   DRUG ALLERGIES:  No Known Allergies  DISCHARGE MEDICATIONS:   Current Discharge Medication List    START taking these medications   Details  acetaminophen (TYLENOL) 325 MG tablet Take 2 tablets (650 mg total) by mouth every 6 (six) hours as needed for mild pain (or Fever >/= 101).    calcium carbonate (TUMS - DOSED IN MG ELEMENTAL CALCIUM)  500 MG chewable tablet Chew 1 tablet (200 mg of elemental calcium total) by mouth 3 (three) times daily as needed for indigestion or heartburn.    senna-docusate (SENOKOT-S) 8.6-50 MG tablet Take 1 tablet by mouth at bedtime as needed for mild constipation.      CONTINUE these medications which have CHANGED    Details  warfarin (COUMADIN) 5 MG tablet Take 1 tablet (5 mg total) by mouth daily. Qty: 2 tablet, Refills: 0      CONTINUE these medications which have NOT CHANGED   Details  albuterol (PROVENTIL HFA;VENTOLIN HFA) 108 (90 Base) MCG/ACT inhaler Inhale 2 puffs into the lungs every 6 (six) hours as needed for wheezing or shortness of breath. Qty: 1 Inhaler, Refills: 3    clonazePAM (KLONOPIN) 0.5 MG tablet TAKE 1/2 TO 1 TABLET BY MOUTH ONCE DAILY AS NEEDED Qty: 30 tablet, Refills: 0    omeprazole (PRILOSEC) 20 MG capsule Take 20 mg by mouth daily.    pravastatin (PRAVACHOL) 40 MG tablet TAKE 1 TABLET (40 MG TOTAL) BY MOUTH DAILY. Qty: 90 tablet, Refills: 3      STOP taking these medications     carvedilol (COREG) 25 MG tablet      hydrALAZINE (APRESOLINE) 10 MG tablet      losartan (COZAAR) 100 MG tablet      azithromycin (ZITHROMAX) 250 MG tablet          DISCHARGE INSTRUCTIONS:   Hold all blood pressure medications for now Follow-up with primary care physician on 12/11/2016. PCP to repeat PT/INR and manage Coumadin Follow-up with Dr. Seward Meth on 12/12/2016 [facility]   DIET:  Regular diet  DISCHARGE CONDITION:  Stable  ACTIVITY:  Activity as tolerated per PT  OXYGEN:  Home Oxygen: No.   Oxygen Delivery: room air  DISCHARGE LOCATION:  home With home health  If you experience worsening of your admission symptoms, develop shortness of breath, life threatening emergency, suicidal or homicidal thoughts you must seek medical attention immediately by calling 911 or calling your MD immediately  if symptoms less severe.  You Must read complete instructions/literature along with all the possible adverse reactions/side effects for all the Medicines you take and that have been prescribed to you. Take any new Medicines after you have completely understood and accpet all the possible adverse reactions/side effects.   Please note  You were cared for by a  hospitalist during your hospital stay. If you have any questions about your discharge medications or the care you received while you were in the hospital after you are discharged, you can call the unit and asked to speak with the hospitalist on call if the hospitalist that took care of you is not available. Once you are discharged, your primary care physician will handle any further medical issues. Please note that NO REFILLS for any discharge medications will be authorized once you are discharged, as it is imperative that you return to your primary care physician (or establish a relationship with a primary care physician if you do not have one) for your aftercare needs so that they can reassess your need for medications and monitor your lab values.     Today  Chief Complaint  Patient presents with  . Shortness of Breath   Patient is feeling much better. Denies any shortness of breath or dizziness. Really want to go home. Daughter at bedside. Agreeable. Discussed with oncologist yesterday and plan is to follow-up with oncology on January 30  ROS:  CONSTITUTIONAL:  Denies fevers, chills. Denies any fatigue, weakness.  EYES: Denies blurry vision, double vision, eye pain. EARS, NOSE, THROAT: Denies tinnitus, ear pain, hearing loss. RESPIRATORY: Denies cough, wheeze, shortness of breath.  CARDIOVASCULAR: Denies chest pain, palpitations, edema.  GASTROINTESTINAL: Denies nausea, vomiting, diarrhea, abdominal pain. Denies bright red blood per rectum. GENITOURINARY: Denies dysuria, hematuria. ENDOCRINE: Denies nocturia or thyroid problems. HEMATOLOGIC AND LYMPHATIC: Denies easy bruising or bleeding. SKIN: Denies rash or lesion. MUSCULOSKELETAL: Denies pain in neck, back, shoulder, knees, hips or arthritic symptoms.  NEUROLOGIC: Denies paralysis, paresthesias.  PSYCHIATRIC: Denies anxiety or depressive symptoms.   VITAL SIGNS:  Blood pressure (!) 105/57, pulse 92, temperature 98.2 F (36.8 C),  temperature source Oral, resp. rate 18, height '5\' 6"'$  (1.676 m), weight 70.3 kg (155 lb), SpO2 93 %.  I/O:    Intake/Output Summary (Last 24 hours) at 12/09/16 1234 Last data filed at 12/09/16 0800  Gross per 24 hour  Intake              830 ml  Output                0 ml  Net              830 ml    PHYSICAL EXAMINATION:  GENERAL:  83 y.o.-year-old patient lying in the bed with no acute distress.  EYES: Pupils equal, round, reactive to light and accommodation. No scleral icterus. Extraocular muscles intact.  HEENT: Head atraumatic, normocephalic. Oropharynx and nasopharynx clear. Status post right supraclavicular lymph node biopsy site looks clean with no bleeding or bruising, NECK:  Supple, no jugular venous distention. No thyroid enlargement, no tenderness.  LUNGS: Moderate breath sounds bilaterally, no wheezing, rales,rhonchi or crepitation. No use of accessory muscles of respiration.  CARDIOVASCULAR: S1, S2 normal. No murmurs, rubs, or gallops.  ABDOMEN: Soft, non-tender, non-distended. Bowel sounds present. No organomegaly or mass.  EXTREMITIES: No pedal edema, cyanosis, or clubbing.  NEUROLOGIC: Cranial nerves II through XII are intact. Muscle strength 5/5 in all extremities. Sensation intact. Gait not checked.  PSYCHIATRIC: The patient is alert and oriented x 3.  SKIN: No obvious rash, lesion, or ulcer.   DATA REVIEW:   CBC  Recent Labs Lab 12/09/16 0451  WBC 4.0  HGB 11.2*  HCT 30.8*  PLT 124*    Chemistries   Recent Labs Lab 12/05/16 1145  12/09/16 0451  NA 130*  < > 134*  K 4.8  < > 3.8  CL 98*  < > 100*  CO2 26  < > 27  GLUCOSE 131*  < > 97  BUN 12  < > 15  CREATININE 1.02*  < > 0.98  CALCIUM 9.5  < > 9.3  AST 32  --   --   ALT 14  --   --   ALKPHOS 67  --   --   BILITOT 1.2  --   --   < > = values in this interval not displayed.  Cardiac Enzymes  Recent Labs Lab 12/05/16 1657  TROPONINI <0.03    Microbiology Results  Results for orders  placed or performed during the hospital encounter of 12/05/16  Blood culture (routine x 2)     Status: None (Preliminary result)   Collection Time: 12/05/16  4:44 PM  Result Value Ref Range Status   Specimen Description BLOOD RIGHT ASSIST CONTROL  Final   Special Requests   Final    BOTTLES DRAWN AEROBIC AND ANAEROBIC 10MLAERO,10MLANA   Culture NO  GROWTH 3 DAYS  Final   Report Status PENDING  Incomplete  Blood culture (routine x 2)     Status: None (Preliminary result)   Collection Time: 12/05/16  4:44 PM  Result Value Ref Range Status   Specimen Description BLOOD RIGHT ARM  Final   Special Requests   Final    BOTTLES DRAWN AEROBIC AND ANAEROBIC 9MLAERO,11MLANA   Culture NO GROWTH 3 DAYS  Final   Report Status PENDING  Incomplete  Urine culture     Status: Abnormal   Collection Time: 12/05/16  4:57 PM  Result Value Ref Range Status   Specimen Description URINE, RANDOM  Final   Special Requests NONE  Final   Culture >=100,000 COLONIES/mL ESCHERICHIA COLI (A)  Final   Report Status 12/08/2016 FINAL  Final   Organism ID, Bacteria ESCHERICHIA COLI (A)  Final      Susceptibility   Escherichia coli - MIC*    AMPICILLIN <=2 SENSITIVE Sensitive     CEFAZOLIN <=4 SENSITIVE Sensitive     CEFTRIAXONE <=1 SENSITIVE Sensitive     CIPROFLOXACIN <=0.25 SENSITIVE Sensitive     GENTAMICIN <=1 SENSITIVE Sensitive     IMIPENEM <=0.25 SENSITIVE Sensitive     NITROFURANTOIN <=16 SENSITIVE Sensitive     TRIMETH/SULFA <=20 SENSITIVE Sensitive     AMPICILLIN/SULBACTAM <=2 SENSITIVE Sensitive     PIP/TAZO <=4 SENSITIVE Sensitive     Extended ESBL NEGATIVE Sensitive     * >=100,000 COLONIES/mL ESCHERICHIA COLI  Body fluid culture     Status: None (Preliminary result)   Collection Time: 12/07/16  2:56 PM  Result Value Ref Range Status   Specimen Description PLEURAL  Final   Special Requests NONE  Final   Gram Stain   Final    RARE WBC PRESENT, PREDOMINANTLY MONONUCLEAR NO ORGANISMS SEEN     Culture   Final    NO GROWTH 2 DAYS Performed at [facility], [address] [place], [place] [address]    Report Status PENDING  Incomplete    RADIOLOGY:  Dg Chest 1 View  Result Date: 12/07/2016 CLINICAL DATA:  S/p thoracentesis  Pleural effusion right EXAM: CHEST  1 VIEW COMPARISON:  12/05/2016 FINDINGS: Interval resolution of the previously noted right pleural effusion. No pneumothorax. Improved aeration of the right lung base. Heart size upper limits normal.  Atheromatous aorta. Mild thoracolumbar scoliosis. Cholecystectomy clips. IMPRESSION: 1. Resolution of right pleural effusion post thoracentesis, with no pneumothorax. Electronically Signed   By: Lucrezia Europe M.D.   On: 12/07/2016 16:14   Dg Chest 2 View  Result Date: 12/05/2016 CLINICAL DATA:  One month of shortness of breath. History of atrial fibrillation, aortic stenosis, discontinued smoking 2 years ago. EXAM: CHEST  2 VIEW COMPARISON:  PA and lateral chest x-ray of November 17, 2016 FINDINGS: Since the previous study these right-sided pleural effusion has increased in volume. The right hemidiaphragm is obscured. The left lung is well-expanded. There is a trace of pleural fluid on the left. The heart is top-normal in size. The pulmonary vascularity is mildly prominent. There is soft tissue fullness in the right hilar region more conspicuous than in the past. There is mitral annular calcification. There is calcification in the wall of the thoracic aorta. IMPRESSION: COPD. Increased volume of the right-sided pleural effusion which now occupies approximately 1/3 of the pleural space volume. This may be obscuring a basilar pneumonia or neoplasm. Persistent soft tissue fullness in the right hilar region new since December 2015.  Mild CHF.  Thoracic aortic atherosclerosis. Chest CT scanning is recommended to further evaluate the right hilum and right lower hemithorax. Electronically Signed   By: David  Martinique M.D.   On: 12/05/2016 12:42    Ct Head W & Wo Contrast  Result Date: 12/06/2016 CLINICAL DATA:  Productive cough for several weeks. Massive adenopathy noted on recent chest CT with possible superior vena cava compression. Follow-up evaluation. EXAM: CT HEAD WITHOUT AND WITH CONTRAST CT NECK WITH CONTRAST TECHNIQUE: Contiguous axial images were obtained from the base of the skull through the vertex without and with intravenous contrast. Multidetector CT imaging of the and neck was performed using the standard protocol following the bolus administration of intravenous contrast. CONTRAST:  38m ISOVUE-300 IOPAMIDOL (ISOVUE-300) INJECTION 61% COMPARISON:  CT chest December 05, 2016 FINDINGS: CT HEAD FINDINGS BRAIN: The ventricles and sulci are normal for age. No intraparenchymal hemorrhage, mass effect nor midline shift. Patchy supratentorial white matter hypodensities less than expected for patient's age, though non-specific are most compatible with chronic small vessel ischemic disease. 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 No acute large vascular territory infarcts. No abnormal extra-axial fluid collections. Basal cisterns are patent. VASCULAR: Moderate calcific atherosclerosis of the carotid siphons. SKULL: No skull fracture. Scattered subcentimeter calvarial lytic lesions. No significant scalp soft tissue swelling. SINUSES/ORBITS: The mastoid air-cells and included paranasal sinuses are well-aerated.The included ocular globes and orbital contents are non-suspicious. OTHER: None. CT NECK FINDINGS PHARYNX AND LARYNX: Slight asymmetric fullness LEFT base of tongue without discrete nodule. Aerodigestive tract is otherwise unremarkable. SALIVARY GLANDS: Mildly edematous bilateral submandibular glands without sialolith, ductal dilatation or mass. THYROID: Normal. LYMPH NODES: Bilateral supraclavicular lymphadenopathy measuring up to 3 cm. Bulky level 6 and 7 lymph nodes measure up to 19 mm short access. LEFT retropectoral lymphadenopathy. VASCULAR:  Mass-effect on RIGHT internal jugular vein which remains patent. Lymphadenopathy encases and narrows the brachiocephalic confluence, veins are patent. Patent superior vena cava. Severe calcific atherosclerosis of the carotid bifurcations with suspected hemodynamically significant stenosis. SKELETON: Degenerative cervical spine including severe LEFT facet arthropathy without acute osseous process. Mild chronic appearing T4 compression fracture. OTHER: RIGHT neck skin thickening. Trace effusions in the neck with subcutaneous fat stranding. No drainable fluid collections. IMPRESSION: CT HEAD: Calvarial lesions suspicious for metastasis in the setting of neoplasm, less likely multiple hemangiomas. 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 CT NECK: Bulky lower neck lymphadenopathy highly concerning for lymphoproliferative disorder/lymphoma versus metastatic disease. Mild neck edema. Atherosclerosis and suspected severe stenosis bilateral internal carotid arteries which would be better characterized on CTA NECK. Patent venous structures including superior vena cava. Electronically Signed   By: CElon AlasM.D.   On: 12/06/2016 21:30   Ct Soft Tissue Neck W Wo Contrast  Result Date: 12/06/2016 CLINICAL DATA:  Productive cough for several weeks. Massive adenopathy noted on recent chest CT with possible superior vena cava compression. Follow-up evaluation. EXAM: CT HEAD WITHOUT AND WITH CONTRAST CT NECK WITH CONTRAST TECHNIQUE: Contiguous axial images were obtained from the base of the skull through the vertex without and with intravenous contrast. Multidetector CT imaging of the and neck was performed using the standard protocol following the bolus administration of intravenous contrast. CONTRAST:  73mISOVUE-300 IOPAMIDOL (ISOVUE-300) INJECTION 61% COMPARISON:  CT chest December 05, 2016 FINDINGS: CT HEAD FINDINGS BRAIN: The ventricles and sulci are normal  for age. No intraparenchymal hemorrhage, mass effect nor midline shift. Patchy supratentorial white matter hypodensities less than expected for  patient's age, though non-specific are most compatible with chronic small vessel ischemic disease. 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 No acute large vascular territory infarcts. No abnormal extra-axial fluid collections. Basal cisterns are patent. VASCULAR: Moderate calcific atherosclerosis of the carotid siphons. SKULL: No skull fracture. Scattered subcentimeter calvarial lytic lesions. No significant scalp soft tissue swelling. SINUSES/ORBITS: The mastoid air-cells and included paranasal sinuses are well-aerated.The included ocular globes and orbital contents are non-suspicious. OTHER: None. CT NECK FINDINGS PHARYNX AND LARYNX: Slight asymmetric fullness LEFT base of tongue without discrete nodule. Aerodigestive tract is otherwise unremarkable. SALIVARY GLANDS: Mildly edematous bilateral submandibular glands without sialolith, ductal dilatation or mass. THYROID: Normal. LYMPH NODES: Bilateral supraclavicular lymphadenopathy measuring up to 3 cm. Bulky level 6 and 7 lymph nodes measure up to 19 mm short access. LEFT retropectoral lymphadenopathy. VASCULAR: Mass-effect on RIGHT internal jugular vein which remains patent. Lymphadenopathy encases and narrows the brachiocephalic confluence, veins are patent. Patent superior vena cava. Severe calcific atherosclerosis of the carotid bifurcations with suspected hemodynamically significant stenosis. SKELETON: Degenerative cervical spine including severe LEFT facet arthropathy without acute osseous process. Mild chronic appearing T4 compression fracture. OTHER: RIGHT neck skin thickening. Trace effusions in the neck with subcutaneous fat stranding. No drainable fluid collections. IMPRESSION: CT HEAD: Calvarial lesions suspicious for metastasis in the setting of neoplasm, less likely multiple hemangiomas. Old small LEFT cerebellar  infarcts; otherwise negative CT HEAD with and without contrast, for age. CT NECK: Bulky lower neck lymphadenopathy highly concerning for lymphoproliferative disorder/lymphoma versus metastatic disease. Mild neck edema. Atherosclerosis and suspected severe stenosis bilateral internal carotid arteries which would be better characterized on CTA NECK. Patent venous structures including superior vena cava. Electronically Signed   By: Elon Alas M.D.   On: 12/06/2016 21:30   Ct Chest W Contrast  Result Date: 12/05/2016 CLINICAL DATA:  Plain films demonstrating right-sided airspace disease and suggestion of right hilar soft tissue fullness. Shortness of breath for 1 month. EXAM: CT CHEST WITH CONTRAST TECHNIQUE: Multidetector CT imaging of the chest was performed during intravenous contrast administration. CONTRAST:  59m ISOVUE-300 IOPAMIDOL (ISOVUE-300) INJECTION 61% COMPARISON:  Multiple plain films, most recent 12/05/2016. No prior CTs for comparison. FINDINGS: Cardiovascular: Advanced aortic and branch vessel atherosclerosis. Moderate cardiomegaly with no pericardial effusion. Multivessel coronary artery atherosclerosis. No central pulmonary embolism, on this non-dedicated study. Narrowing of the confluence of the brachiocephalic veins and less so the superior aspect of the SVC. Example image 43/series 2. Mediastinum/Nodes: Massive bilateral supraclavicular adenopathy, including at 3.2 cm in the left supraclavicular space. Marked mediastinal adenopathy, with a right paratracheal node measuring 3.4 cm on image 54/series 2. Subcarinal node measures 2.5 cm on image 75/series 2. Right hilar adenopathy at 2.5 cm. Moderate hiatal hernia.  Anterior mediastinal nodal mass at 2.6 cm. Lungs/Pleura: Small to moderate right-sided pleural effusion, without well-defined pleural mass Minimal motion degradation. Patent airways with mass-effect upon right lower and middle lobe bronchi. Right middle lobe partial collapse.  Dependent right lower lobe airspace disease. Soft tissue mass centered at the lateral aspect of the origin of the right middle lobe bronchus could be pulmonary parenchymal or nodal. Measures 2.9 x 3.9 cm on image 79/series 2. Upper Abdomen: Cholecystectomy. Pneumobilia. Hypoattenuating lateral segment left liver lobe lesion is 1.5 cm and represent a cyst or complex cyst on image 129/series 2. Heterogeneous enhancement involving the remainder of the liver is subtle. Normal imaged portions of the spleen, pancreas, kidneys. Normal imaged adrenal glands. Abdominal aortic atherosclerosis. No upper abdominal adenopathy. Musculoskeletal: Left axillary  adenopathy, include at 1.6 cm on image 33/series 2. Osteopenia. Mildly heterogeneous marrow density, especially involving the left side of T8 vertebral body. IMPRESSION: 1. Massive adenopathy in the low neck and chest, most consistent with neoplasm. Considerations include small-cell lung cancer or lymphoma. The only potential side of primary in the lungs is an area of soft tissue fullness in the right infrahilar region, just lateral to the origin of the right middle lobe bronchus. This could represent the primary pulmonary mass or adenopathy. 2. Right-sided pleural effusion with right middle lobe partial collapse 3. Heterogeneous hepatic density is nonspecific. Metastatic disease cannot be excluded. The largest hypoattenuating area is relatively low density and could represent a cyst or complex cyst. Recommend attention on follow-up. 4. Probable osseous metastasis, most apparent at T8. 5. Consider further evaluation with nonemergent PET and/or thoracic surgery/oncology consultation. 6. Given the extent of upper thoracic adenopathy, the patient may be predisposed to SVC syndrome. Electronically Signed   By: Abigail Miyamoto M.D.   On: 12/05/2016 17:38   US Biopsy  Result Date: 12/08/2016 CLINICAL DATA:  Bulky neck and mediastinal adenopathy of uncertain etiology. Negative  pleural fluid cytology. EXAM: ULTRASOUND GUIDED CORE BIOPSY OF RIGHT SUPRACLAVICULAR ADENOPATHY MEDICATIONS: Intravenous Fentanyl and Versed were administered as conscious sedation during continuous monitoring of the patient's level of consciousness and physiological / cardiorespiratory status by the radiology RN, with a total moderate sedation time of 10 minutes. PROCEDURE: The procedure, risks, benefits, and alternatives were explained to the patient. Questions regarding the procedure were encouraged and answered. The patient understands and consents to the procedure. Survey ultrasound of the right supraclavicular region was obtained. The adenopathy was localized and appropriate skin entry site determined and marked. The operative field was prepped with chlorhexidine in a sterile fashion, and a sterile drape was applied covering the operative field. A sterile gown and sterile gloves were used for the procedure. Local anesthesia was provided with 1% Lidocaine. Under real-time ultrasound guidance, a 17 gauge trocar needle was advanced to the margin of the lesion. Once needle tip position was confirmed, coaxial 18-gauge core biopsy samples were obtained, submitted in saline to surgical pathology. The guide needle was removed. Postprocedure scans show no hematoma. COMPLICATIONS: None. FINDINGS: Right supraclavicular adenopathy was again localized. Representative core biopsy samples obtained as above. IMPRESSION: 1. Technically successful ultrasound-guided core biopsy, right supraclavicular adenopathy Electronically Signed   By: Lucrezia Europe M.D.   On: 12/08/2016 13:20   US Thoracentesis Asp Pleural Space W/img Guide  Result Date: 12/07/2016 CLINICAL DATA:  Massive adenopathy in the low neck and chest, areaof soft tissue fullness in the right infrahilar region, just lateralto the origin of the right middle lobe bronchus. Right-sided pleural effusion with right middle lobe partialcollapse. Probable osseous metastasis,  most apparent at T8. EXAM: EXAM THORACENTESIS WITH ULTRASOUND TECHNIQUE: The procedure, risks (including but not limited to bleeding, infection, organ damage ), benefits, and alternatives were explained to the patient. Questions regarding the procedure were encouraged and answered. The patient understands and consents to the procedure. Survey ultrasound of the right hemithorax was performed and an appropriate skin entry site was localized. Site was marked, prepped with chlorhexidine , draped in usual sterile fashion, infiltrated locally with 1% lidocaine. The Saf-T-Centesis needle was advanced into the pleural space. Clear amber fluid returned. 1.45 L was removed. Post procedure imaging shows no residual fluid. The patient tolerated procedure well. COMPLICATIONS: COMPLICATIONS None immediate IMPRESSION: Technically successful ultrasound-guided right thoracentesis. Follow-up chest radiograph shows no pneumothorax. Electronically  Signed   By: Lucrezia Europe M.D.   On: 12/07/2016 16:16    EKG:   Orders placed or performed during the hospital encounter of 12/05/16  . ED EKG  . ED EKG  . ED EKG  . ED EKG      Management plans discussed with the patient, family and they are in agreement.  CODE STATUS:     Code Status Orders        Start     Ordered   12/05/16 2036  Full code  Continuous     12/05/16 2035    Code Status History    Date Active Date Inactive Code Status Order ID Comments User Context   This patient has a current code status but no historical code status.      TOTAL TIME TAKING CARE OF THIS PATIENT: 45 minutes.   Note: This dictation was prepared with Dragon dictation along with smaller phrase technology. Any transcriptional errors that result from this process are unintentional.   '@MEC'$ @  on 12/09/2016 at 12:34 PM  Between 7am to 6pm - Pager - [phone]  After 6pm go to [email or web address] - password [email or web address]  Office   [phone]  CC: Primary care physician; Einar Pheasant, MD

## 2016-12-09 NOTE — Progress Notes (Signed)
ANTICOAGULATION CONSULT NOTE - Follow Up Consult  Pharmacy Consult for Warfarin Indication: atrial fibrillation  No Known Allergies  Patient Measurements: Height: '5\' 6"'$  (167.6 cm) Weight: 155 lb (70.3 kg) IBW/kg (Calculated) : 59.3 Heparin Dosing Weight:    Vital Signs: Temp: 98.2 F (36.8 C) (01/27 0415) Temp Source: Oral (01/27 0415) BP: 105/57 (01/27 0415) Pulse Rate: 92 (01/27 0415)  Labs:  Recent Labs  12/07/16 0518 12/08/16 0335 12/08/16 1018 12/09/16 0451  HGB 10.9* 11.5*  --  11.2*  HCT 30.0* 31.6*  --  30.8*  PLT 115* 122*  --  124*  LABPROT 19.0* 16.1* 15.6* 16.0*  INR 1.58 1.28 1.23 1.27  CREATININE 0.81 0.86  --  0.98    Estimated Creatinine Clearance: 38.6 mL/min (by C-G formula based on SCr of 0.98 mg/dL).   Medications:  Prescriptions Prior to Admission  Medication Sig Dispense Refill Last Dose  . albuterol (PROVENTIL HFA;VENTOLIN HFA) 108 (90 Base) MCG/ACT inhaler Inhale 2 puffs into the lungs every 6 (six) hours as needed for wheezing or shortness of breath. 1 Inhaler 3 prn at prn  . carvedilol (COREG) 25 MG tablet TAKE 1 TABLET (25 MG TOTAL) BY MOUTH 2 (TWO) TIMES DAILY WITH A MEAL. (Patient taking differently: TAKE 1/2 TABLET (12.5 MG TOTAL) BY MOUTH 2 (TWO) TIMES DAILY WITH A MEAL.) 180 tablet 1 12/05/2016 at 0900  . clonazePAM (KLONOPIN) 0.5 MG tablet TAKE 1/2 TO 1 TABLET BY MOUTH ONCE DAILY AS NEEDED 30 tablet 0 prn at prn  . hydrALAZINE (APRESOLINE) 10 MG tablet TAKE 1 TABLET BY MOUTH 3 TIMES A DAY 90 tablet 5 Past Week at Unknown time  . losartan (COZAAR) 100 MG tablet TAKE 1 TABLET BY MOUTH DAILY. 90 tablet 3 12/05/2016 at 0900  . omeprazole (PRILOSEC) 20 MG capsule Take 20 mg by mouth daily.   12/05/2016 at 0900  . pravastatin (PRAVACHOL) 40 MG tablet TAKE 1 TABLET (40 MG TOTAL) BY MOUTH DAILY. 90 tablet 3 12/05/2016 at 0900  . warfarin (COUMADIN) 5 MG tablet TAKE AS DIRECTED BY ANTICOAGULATION CLINIC (Patient taking differently: 1/2 tablet  daily) 30 tablet 3 12/04/2016 at 2000  . warfarin (COUMADIN) 5 MG tablet Take 2.5 mg by mouth daily.   12/04/2016 at 2000  . azithromycin (ZITHROMAX) 250 MG tablet Take two tablets x 1 day and then one tablet per day for four more days. (Patient not taking: Reported on 12/05/2016) 6 tablet 0 Not Taking at Unknown time    Assessment: Pt is a 83 year old female with lung cancer and afbi on warfarin at home. Pt INR on admission is 3.49. Home dose is 2.'5mg'$  daily. Of note INR was also high on 1/3 at anticoag visit and was instructed to hold warfarin x 1 and then resume dose. Pt on azithromycin from 1/8-1/12. Per MD note patient may have a biopsy or thoracentesis.  1/23 INR 3.49, no warfarin 1/24 INR 3.37; no warfarin  1/25: INR: 1.58; 3 mg  1/26: INR: 1.28; '3mg'$  1/27  INR: 1.27  Goal of Therapy:  INR 2-3   Plan:  Will give warfarin '5mg'$  PO x 1. Will recheck INR in am.   Olivia Canter, [facility] Clinical Pharmacist 12/09/2016, 10:27 AM

## 2016-12-09 NOTE — Progress Notes (Signed)
Physical Therapy Treatment Patient Details Name: [name] [name] MRN: [id number] DOB: 1928-04-18 Today's Date: 12/09/2016    History of Present Illness Pt 83 yo female presented to hospital with progressive SOB; suspected Lung Cancer with metastasis to other sites.     PT Comments    Pt in bed, hoping to go home today.  Daughter present.  Bed mobility without assist.  She was able to ambulate around nursing unit x 1 with walker and supervision.  Stated she does not use walker at baseline but stated she would use one upon returning home due to general deconditioning.  No lob's or buckling, some fatigue but tolerated well.   Follow Up Recommendations  Home health PT;Supervision - Intermittent     Equipment Recommendations  Rolling walker with 5" wheels    Recommendations for Other Services       Precautions / Restrictions Precautions Precautions: Fall Restrictions Weight Bearing Restrictions: No    Mobility  Bed Mobility Overal bed mobility: Independent                Transfers Overall transfer level: Modified independent Equipment used: Rolling walker (2 wheeled)             General transfer comment: RW for safety- good push off w/ UE   Ambulation/Gait Ambulation/Gait assistance: Supervision Ambulation Distance (Feet): 160 Feet Assistive device: Rolling walker (2 wheeled) Gait Pattern/deviations: Step-through pattern     General Gait Details: no SOB noted with gait today.  Does not use RW at baseline.  Encouraged to use upon D/C and she agreed due to generall decreased strength.   Stairs            Wheelchair Mobility    Modified Rankin (Stroke Patients Only)       Balance Overall balance assessment: Needs assistance Sitting-balance support: No upper extremity supported Sitting balance-Leahy Scale: Normal Sitting balance - Comments: Able to maintain uprigth sitting posture w/o difficulty   Standing balance support: Bilateral upper  extremity supported Standing balance-Leahy Scale: Good Standing balance comment: RW for additional stability                     Cognition Arousal/Alertness: Awake/alert Behavior During Therapy: WFL for tasks assessed/performed Overall Cognitive Status: Within Functional Limits for tasks assessed                      Exercises      General Comments        Pertinent Vitals/Pain Pain Assessment: No/denies pain    Home Living                      Prior Function            PT Goals (current goals can now be found in the care plan section) Progress towards PT goals: Progressing toward goals    Frequency    Min 2X/week      PT Plan      Co-evaluation             End of Session Equipment Utilized During Treatment: Gait belt Activity Tolerance: Patient limited by fatigue Patient left: in bed;with call bell/phone within reach;with family/visitor present     Time: 1000-1008 PT Time Calculation (min) (ACUTE ONLY): 8 min  Charges:  $Gait Training: 8-22 mins                    G Codes:  [name] [name] 12/09/2016, 10:54 AM

## 2016-12-09 NOTE — Progress Notes (Signed)
  Progress Note   Date: 12/09/2016  Patient Name: [name] [name]        MRN#: [id number]  Clarification of the stage of CKD:  CKD Stage III     Max Sane, MD

## 2016-12-09 NOTE — Discharge Instructions (Signed)
Managing Cancer-Related Fatigue Fatigue is the most common side effect of cancer and treatments for cancer such as chemotherapy and radiation. It can make you feel tired, weak, and drained of energy even after a small amount of activity. Unlike the fatigue that healthy people feel, cancer-related fatigue may not go away with sleep or rest. Fatigue is typically worse during treatment, but survivors may continue to feel some fatigue after treatment ends. There are several things you can do to help manage your fatigue. What can I do to help prevent cancer-related fatigue? Cancer treatment, low blood counts, appetite changes, and changes in sleep routines can all cause fatigue. Some ways to reduce or prevent fatigue include:  Eating healthy and drinking enough fluid to stay hydrated. Talk with a nutritionist or dietitian who can help you plan the best diet for you.  Staying active and getting regular exercise. Check with your cancer care team first to make sure this is safe.  Getting plenty of rest and taking it easy. Try taking a few short naps or rest breaks throughout the day. Tell your health care provider about any cancer-related side effects. He or she may be able to adjust your medicines or treatment to reduce your fatigue. Follow these instructions at home: Lifestyle  Exercise regularly.  Aim for 20 minutes of moderate exercise 4-5 times a week, or 60 minutes 3 times a week.  Examples of moderate exercise include walking, biking, or swimming. Ask your health care provider what activities are safe for you.  Remember to pace yourself and take short rest breaks.  Find an activity or hobby that you enjoy, and spend 20-30 minutes on it a few times a week. This can help improve your mood, your focus, and your overall mental health.  Get plenty of rest and keep a regular sleep routine.  Go to bed at the same time every night.  Only use your bed for sleep.  Avoid noise during sleep.  Take  short naps or rest breaks during the day. Do not nap for more than 20 minutes.  Try to lower your stress. If you need help with this, talk with your health care provider about ways to manage stress. Some things you can try include:  Doing mind and body exercises such as meditation, yoga, tai chi, or qigong.  Writing in a journal.  Making a to-do list and prioritizing only the important tasks. Eating and drinking  Eat a healthy diet that includes fresh fruits and vegetables, lean proteins, healthy fats, low-fat dairy products, and whole grains.  Avoid simple, refined sugars, such as soft drinks and sweets.  Try eating small meals and snacks throughout the day. A piece of fruit and some nuts are good choices for snacking.  Try to drink at least 8 glasses of water every day.  Limit the amount of alcohol and caffeine that you drink. Medicines  Talk with your health care provider about prescription medicines and over-the-counter vitamins and supplements that you take. Where to find support: Your health care provider or cancer care team will be able to give you advice about how to cope with fatigue. They can also guide you in making health and lifestyle changes that may help you feel less tired. Your health care provider may also refer you to a therapist for counseling. Talking with a therapist may help you reduce fatigue by working through any stresses or fears that you may have. Where to find more information: There are a number of trusted  print and online resources that can help you learn more about cancer and its side effects. Ask your health care provider what he or she recommends. You can start with:  The American Cancer Society: www.cancer.org  The American Society of Clinical Oncology: www.cancer.net  The [employer]: www.cancer.gov Contact a health care provider if:  You feel depressed.  Your fatigue gets worse or does not go away with sleep or rest.  You are  unable to sleep at night.  You become confused or you are not able to think clearly.  You feel short of breath, feel dizzy, or have a racing heartbeat after little activity.  You are thinking about changing your medicines or treatment.  You would like to make changes to your diet or are thinking about taking dietary supplements. Summary  Fatigue is the most common side effect of cancer treatments such as chemotherapy and radiation.  Unlike the fatigue that healthy people feel, cancer-related fatigue may not go away with sleep and rest alone.  There are several things you can do to help manage your fatigue, including eating a healthy diet, getting exercise, and keeping a regular sleep routine.  Your health care provider or cancer care team will be able to give you advice about how to cope with fatigue. They can also guide you in making health and lifestyle changes that may help you feel less tired. This information is not intended to replace advice given to you by your health care provider. Make sure you discuss any questions you have with your health care provider. Document Released: 06/26/2016 Document Revised: 06/26/2016 Document Reviewed: 06/26/2016 Elsevier Interactive Patient Education  2017 [place] all blood pressure medications for now Follow-up with primary care physician on 12/11/2016. PCP to repeat PT/INR and manage Coumadin Follow-up with Dr. Seward Meth on 12/12/2016 at [facility] clinic

## 2016-12-09 NOTE — Progress Notes (Signed)
Patient is alert and oriented, daughter at bedside, denies pain, ambulatory in room with stand by assist, poor appetite, patient notes that she will eat better at home, does not like the food here. Patient is insistent of being d/c to home, d/c to home via daughter with home health, patient instructed o dose change of warfain and to hold blood pressure medications. D/c instructions reviewed in depth with patient and daughter, verbalize understanding of instructions, no further questions, patient notes that she has a f/u appt on Monday to have INR rechecked. Uneventful shift.

## 2016-12-10 LAB — CULTURE, BLOOD (ROUTINE X 2)
CULTURE: NO GROWTH
CULTURE: NO GROWTH

## 2016-12-11 ENCOUNTER — Telehealth: Payer: Self-pay | Admitting: Internal Medicine

## 2016-12-11 ENCOUNTER — Telehealth: Payer: Self-pay | Admitting: *Deleted

## 2016-12-11 LAB — BODY FLUID CULTURE: Culture: NO GROWTH

## 2016-12-11 LAB — CYTOLOGY - NON PAP

## 2016-12-11 NOTE — Telephone Encounter (Signed)
Ok to give verbal orders for therapy.  Once notified, please forward to Kerin Salen to see if TCM call needed.  Thanks

## 2016-12-11 NOTE — Telephone Encounter (Signed)
Neoma Laming from [employer] reported  That pt discharged from [facility] on 01/27.  She was advised to have orders for Home health : Nursing ,physical therapy and home health aid. Pt will also need PT/INR . Please cotact Neoma Laming with verbal orders.  Contact [phone]

## 2016-12-11 NOTE — Telephone Encounter (Signed)
Please advise with orders.

## 2016-12-11 NOTE — Telephone Encounter (Signed)
Spoke to [place] the Triage gave verbal order per Dr. Nicki Reaper she well call back if any questions.

## 2016-12-11 NOTE — Telephone Encounter (Signed)
This pt needs to be seen in [place] on jan 30th/tuesday at 1:45 pm Dx: lung cancer/ hospital follow up. Thx

## 2016-12-11 NOTE — Telephone Encounter (Signed)
Patient is on scheduled in [place] tomorrow per md order.

## 2016-12-12 ENCOUNTER — Inpatient Hospital Stay: Payer: Medicare Other | Attending: Internal Medicine | Admitting: Internal Medicine

## 2016-12-12 ENCOUNTER — Telehealth: Payer: Self-pay | Admitting: *Deleted

## 2016-12-12 ENCOUNTER — Other Ambulatory Visit: Payer: Self-pay | Admitting: Pathology

## 2016-12-12 DIAGNOSIS — Z79899 Other long term (current) drug therapy: Secondary | ICD-10-CM | POA: Insufficient documentation

## 2016-12-12 DIAGNOSIS — M1990 Unspecified osteoarthritis, unspecified site: Secondary | ICD-10-CM | POA: Insufficient documentation

## 2016-12-12 DIAGNOSIS — I129 Hypertensive chronic kidney disease with stage 1 through stage 4 chronic kidney disease, or unspecified chronic kidney disease: Secondary | ICD-10-CM | POA: Diagnosis not present

## 2016-12-12 DIAGNOSIS — Z87891 Personal history of nicotine dependence: Secondary | ICD-10-CM | POA: Diagnosis not present

## 2016-12-12 DIAGNOSIS — I4891 Unspecified atrial fibrillation: Secondary | ICD-10-CM | POA: Diagnosis not present

## 2016-12-12 DIAGNOSIS — E785 Hyperlipidemia, unspecified: Secondary | ICD-10-CM | POA: Diagnosis not present

## 2016-12-12 DIAGNOSIS — N189 Chronic kidney disease, unspecified: Secondary | ICD-10-CM | POA: Diagnosis not present

## 2016-12-12 DIAGNOSIS — K219 Gastro-esophageal reflux disease without esophagitis: Secondary | ICD-10-CM | POA: Insufficient documentation

## 2016-12-12 DIAGNOSIS — C342 Malignant neoplasm of middle lobe, bronchus or lung: Secondary | ICD-10-CM | POA: Insufficient documentation

## 2016-12-12 DIAGNOSIS — Z7901 Long term (current) use of anticoagulants: Secondary | ICD-10-CM | POA: Insufficient documentation

## 2016-12-12 LAB — SURGICAL PATHOLOGY

## 2016-12-12 NOTE — Assessment & Plan Note (Addendum)
#   Metastatic/stage IV/ extensive stage small cell lung cancer. A long discussion with patient/daughters regarding the incurable and the aggressive nature of the disease.  # For a long discussion regarding the pros and cons of chemotherapy- carboplatin and etoposide every 3 weeks. A good rate of response however short lived. Discussed that the median life expectancy of treated extensive stage small cell lung cancer is anywhere between 6-12 months.  Discussed the potential side effects including but not limited to-increasing fatigue, nausea vomiting, diarrhea, hair loss, sores in the mouth, increase risk of infection and also neuropathy.   # After lengthy discussion patient did have chemotherapy.  # Discussed regarding hospice; patient seems to be interested however does not want to enroll in hospice services at this moment.  # Also discussed regarding palliative care/ Pleurx catheter if she has recurrent pleural effusions.  # A. fib on Coumadin defer to cardiology regarding continuation of Coumadin- in the context of her incurable disease.  # Patient follow-up with me in approximately 4 weeks/labs.   # 40 minutes face-to-face with the patient discussing the above plan of care; more than 50% of time spent on prognosis/ natural history; counseling and coordination.  CC: Dr.Gollan; Dr.Scott.

## 2016-12-12 NOTE — Telephone Encounter (Signed)
-----   Message from Wallene Dales sent at 12/12/2016  4:04 PM EST ----- Regarding: [facility] Orders They received the orders from her PCP. We do not need to submit orders to them, but she does have you listed as someone to contact in reference to the patients cancer.

## 2016-12-12 NOTE — Progress Notes (Signed)
Patient here today for follow up.   

## 2016-12-12 NOTE — Telephone Encounter (Signed)
noted 

## 2016-12-12 NOTE — Progress Notes (Signed)
[place] OFFICE PROGRESS NOTE  Patient Care Team: Einar Pheasant, MD as PCP - General (Internal Medicine) Einar Pheasant, MD (Internal Medicine) Robert Bellow, MD (General Surgery) Minna Merritts, MD as Consulting Physician (Cardiology)  Cancer Staging No matching staging information was found for the patient.   Oncology History   # JAN 2018- EXTENSIVE STAGE SMALL CELL LUNG CANCER [ Neck LN Bx]; CT- pleural effusion; Extensive bil SupraClav LN; mediastina LN; ? Liver/bon met  # Pleural effusion s/p thora [jan 2018]  # Brain CT- NEG     Primary cancer of right middle lobe of lung ([place])   12/12/2016 Initial Diagnosis    Primary cancer of right middle lobe of lung ([place])         INTERVAL HISTORY:  [name] [name] 83 y.o.  female pleasant patient above history of Newly diagnosed lung cancer is here to discuss the pathology/treatment plan.  The patient was originally seen by me in the hospital last week when she was admitted for worsening shortness of breath; CT scan as summarized above showed bilateral neck adenopathy, mediastinal adenopathy and pleural effusion and right hilar/middle lobe masses. Patient had thoracentesis- negative for cytology; lymph node biopsy the neck, back positive for malignancy.  Patient generally has been fairly active for age driving and taking care of herself. She has history of A. fib on Coumadin discontinued this time.   REVIEW OF SYSTEMS:  A complete 10 point review of system is done which is negative except mentioned above/history of present illness.   PAST MEDICAL HISTORY :  Past Medical History:  Diagnosis Date  . Atrial fibrillation ([place])   . Carotid stenosis   . Cholelithiasis   . Chronic kidney disease   . Chronic low back pain   . Gallstone pancreatitis 11-10-14  . Gallstones   . GERD (gastroesophageal reflux disease)   . Hyperlipidemia   . Hypertension   . Impaired fasting glucose   . Osteoarthritis      PAST SURGICAL HISTORY :   Past Surgical History:  Procedure Laterality Date  . APPENDECTOMY    . CATARACT EXTRACTION     x2  . CHOLECYSTECTOMY  11-10-14  . CHOLECYSTECTOMY      FAMILY HISTORY :   Family History  Problem Relation Age of Onset  . Heart attack Father 21  . Atrial fibrillation Sister   . Atrial fibrillation Brother   . Heart failure Sister 16  . Stroke Sister     SOCIAL HISTORY:   Social History  Substance Use Topics  . Smoking status: Former Smoker    Packs/day: 0.50    Types: Cigarettes    Quit date: 11/04/2014  . Smokeless tobacco: Never Used     Comment: pt stopped two weeks ago  . Alcohol use 0.0 oz/week     Comment: occasional    ALLERGIES:  has No Known Allergies.  MEDICATIONS:  Current Outpatient Prescriptions  Medication Sig Dispense Refill  . acetaminophen (TYLENOL) 325 MG tablet Take 2 tablets (650 mg total) by mouth every 6 (six) hours as needed for mild pain (or Fever >/= 101).    Marland Kitchen albuterol (PROVENTIL HFA;VENTOLIN HFA) 108 (90 Base) MCG/ACT inhaler Inhale 2 puffs into the lungs every 6 (six) hours as needed for wheezing or shortness of breath. 1 Inhaler 3  . calcium carbonate (TUMS - DOSED IN MG ELEMENTAL CALCIUM) 500 MG chewable tablet Chew 1 tablet (200 mg of elemental calcium total) by mouth 3 (three) times  daily as needed for indigestion or heartburn.    . clonazePAM (KLONOPIN) 0.5 MG tablet TAKE 1/2 TO 1 TABLET BY MOUTH ONCE DAILY AS NEEDED 30 tablet 0  . omeprazole (PRILOSEC) 20 MG capsule Take 20 mg by mouth daily.    . pravastatin (PRAVACHOL) 40 MG tablet TAKE 1 TABLET (40 MG TOTAL) BY MOUTH DAILY. 90 tablet 3  . senna-docusate (SENOKOT-S) 8.6-50 MG tablet Take 1 tablet by mouth at bedtime as needed for mild constipation.    Marland Kitchen warfarin (COUMADIN) 5 MG tablet Take 1 tablet (5 mg total) by mouth daily. 2 tablet 0   No current facility-administered medications for this visit.     PHYSICAL EXAMINATION: ECOG PERFORMANCE STATUS: 1  - Symptomatic but completely ambulatory  BP 127/76 (BP Location: Left Arm, Patient Position: Sitting)   Pulse 96   Temp 98.4 F (36.9 C) (Tympanic)   Wt 146 lb 6.2 oz (66.4 kg)   BMI 23.63 kg/m   Filed Weights   12/12/16 1355  Weight: 146 lb 6.2 oz (66.4 kg)    GENERAL: Well-nourished well-developed; Alert, no distress and comfortable. Accompanied by her daughter.  EYES: no pallor or icterus OROPHARYNX: no thrush or ulceration. NECK: supple.  LYMPH:   3-4 cm supraclavicular lymph nodes felt.  LUNGS: decreased breath sounds to auscultation on the right side compared to the left  at bases and  No wheeze or crackles HEART/CVS: regular rate & rhythm and no murmurs; No lower extremity edema ABDOMEN: abdomen soft, non-tender and normal bowel sounds Musculoskeletal:no cyanosis of digits and no clubbing  PSYCH: alert & oriented x 3 with fluent speech NEURO: no focal motor/sensory deficits SKIN:  no rashes or significant lesions; multiple chronic ecchymosis noted.  LABORATORY DATA:  I have reviewed the data as listed    Component Value Date/Time   NA 134 (L) 12/09/2016 0451   NA 136 11/11/2014 0427   K 3.8 12/09/2016 0451   K 4.3 11/11/2014 0427   CL 100 (L) 12/09/2016 0451   CL 104 11/11/2014 0427   CO2 27 12/09/2016 0451   CO2 22 11/11/2014 0427   GLUCOSE 97 12/09/2016 0451   GLUCOSE 99 11/11/2014 0427   BUN 15 12/09/2016 0451   BUN 17 11/11/2014 0427   CREATININE 0.98 12/09/2016 0451   CREATININE 0.95 11/11/2014 0427   CALCIUM 9.3 12/09/2016 0451   CALCIUM 8.6 11/11/2014 0427   PROT 6.8 12/05/2016 1145   PROT 5.5 (L) 11/11/2014 0427   ALBUMIN 3.8 12/05/2016 1145   ALBUMIN 2.5 (L) 11/11/2014 0427   AST 32 12/05/2016 1145   AST 54 (H) 11/11/2014 0427   ALT 14 12/05/2016 1145   ALT 62 11/11/2014 0427   ALKPHOS 67 12/05/2016 1145   ALKPHOS 169 (H) 11/11/2014 0427   BILITOT 1.2 12/05/2016 1145   BILITOT 1.5 (H) 11/11/2014 0427   GFRNONAA 51 (L) 12/09/2016 0451    GFRNONAA 60 (L) 11/11/2014 0427   GFRAA 59 (L) 12/09/2016 0451   GFRAA >60 11/11/2014 0427    No results found for: SPEP, UPEP  Lab Results  Component Value Date   WBC 4.0 12/09/2016   NEUTROABS 4.6 09/06/2016   HGB 11.2 (L) 12/09/2016   HCT 30.8 (L) 12/09/2016   MCV 92.3 12/09/2016   PLT 124 (L) 12/09/2016      Chemistry      Component Value Date/Time   NA 134 (L) 12/09/2016 0451   NA 136 11/11/2014 0427   K 3.8 12/09/2016 0451  K 4.3 11/11/2014 0427   CL 100 (L) 12/09/2016 0451   CL 104 11/11/2014 0427   CO2 27 12/09/2016 0451   CO2 22 11/11/2014 0427   BUN 15 12/09/2016 0451   BUN 17 11/11/2014 0427   CREATININE 0.98 12/09/2016 0451   CREATININE 0.95 11/11/2014 0427      Component Value Date/Time   CALCIUM 9.3 12/09/2016 0451   CALCIUM 8.6 11/11/2014 0427   ALKPHOS 67 12/05/2016 1145   ALKPHOS 169 (H) 11/11/2014 0427   AST 32 12/05/2016 1145   AST 54 (H) 11/11/2014 0427   ALT 14 12/05/2016 1145   ALT 62 11/11/2014 0427   BILITOT 1.2 12/05/2016 1145   BILITOT 1.5 (H) 11/11/2014 0427       RADIOGRAPHIC STUDIES: I have personally reviewed the radiological images as listed and agreed with the findings in the report. No results found.   ASSESSMENT & PLAN:  Primary cancer of right middle lobe of lung ([place]) # Metastatic/stage IV/ extensive stage small cell lung cancer. A long discussion with patient/daughters regarding the incurable and the aggressive nature of the disease.  # For a long discussion regarding the pros and cons of chemotherapy- carboplatin and etoposide every 3 weeks. A good rate of response however short lived. Discussed that the median life expectancy of treated extensive stage small cell lung cancer is anywhere between 6-12 months.  Discussed the potential side effects including but not limited to-increasing fatigue, nausea vomiting, diarrhea, hair loss, sores in the mouth, increase risk of infection and also neuropathy.   # After lengthy  discussion patient did have chemotherapy.  # Discussed regarding hospice; patient seems to be interested however does not want to enroll in hospice services at this moment.  # Also discussed regarding palliative care/ Pleurx catheter if she has recurrent pleural effusions.  # A. fib on Coumadin defer to cardiology regarding continuation of Coumadin- in the context of her incurable disease.  # Patient follow-up with me in approximately 4 weeks/labs.   # 40 minutes face-to-face with the patient discussing the above plan of care; more than 50% of time spent on prognosis/ natural history; counseling and coordination.  CC: Dr.Gollan; Dr.Scott.    Orders Placed This Encounter  Procedures  . CBC with Differential    Standing Status:   Future    Standing Expiration Date:   12/12/2017  . Comprehensive metabolic panel    Standing Status:   Future    Standing Expiration Date:   12/12/2017   All questions were answered. The patient knows to call the clinic with any problems, questions or concerns.      Cammie Sickle, MD 12/12/2016 3:10 PM

## 2016-12-13 ENCOUNTER — Encounter: Payer: Self-pay | Admitting: Internal Medicine

## 2016-12-13 LAB — COMP PANEL: LEUKEMIA/LYMPHOMA

## 2016-12-13 LAB — PROTIME-INR: Protime: 18.9 seconds — AB (ref 10.0–13.8)

## 2016-12-13 LAB — POCT INR: INR: 1.9 — AB (ref 0.9–1.1)

## 2016-12-14 ENCOUNTER — Telehealth: Payer: Self-pay | Admitting: *Deleted

## 2016-12-14 DIAGNOSIS — Z7901 Long term (current) use of anticoagulants: Secondary | ICD-10-CM

## 2016-12-14 NOTE — Telephone Encounter (Signed)
Left message for [name] [name] [phone] to call

## 2016-12-14 NOTE — Telephone Encounter (Signed)
Daughter requested a call to discuss pt's oncology visit. Dr Gwendlyn Deutscher requested pt  being on prednisone for inflammation of lymph nodes. Daughter also questioned if pt will need a pneumonia vaccine, bone density and a tetanus, due to MyChart sending reminders.  Trixie Rude [phone]

## 2016-12-15 NOTE — Telephone Encounter (Signed)
Spoke with [name] [name] and she is going to contact oncology she they mentioned prescribing Prednisone to help with appetite.  She will also contact them in regards to INR1/30/18  it looks like they are managing and she will let us know.  She will contact us after contacting oncology .

## 2016-12-15 NOTE — Telephone Encounter (Signed)
I received a copy of her INR and there is a result in the chart that looks like oncology has.  Please follow up with pts daughter and see if we need to give orders on her coumadin or is oncology.  (I think cardiology was following previously).

## 2016-12-15 NOTE — Telephone Encounter (Signed)
I can place an order in the computer for pt/inr, but I am not sure that this is what you are talking about.  Just let me know what I need to do.

## 2016-12-15 NOTE — Telephone Encounter (Signed)
We didn't fax order we weren't sure if you could input in the computer.

## 2016-12-15 NOTE — Telephone Encounter (Signed)
That's its . Thank you.

## 2016-12-15 NOTE — Telephone Encounter (Signed)
Per Hinton Dyer patient patient has been taking Coumadin 2.'5mg'$   Qd.  Will continue she had procedures recently and will continue dosing and will need new order faxed to Hospice.

## 2016-12-15 NOTE — Telephone Encounter (Signed)
I think this has already been faxed by Joellen.  Please clarify.  Thanks

## 2016-12-18 NOTE — Telephone Encounter (Signed)
Gave Verbal order to [facility] will re-check Tuesday per verbal orders

## 2016-12-19 ENCOUNTER — Telehealth: Payer: Self-pay | Admitting: *Deleted

## 2016-12-19 ENCOUNTER — Encounter: Payer: Self-pay | Admitting: Internal Medicine

## 2016-12-19 DIAGNOSIS — R451 Restlessness and agitation: Secondary | ICD-10-CM

## 2016-12-19 DIAGNOSIS — C342 Malignant neoplasm of middle lobe, bronchus or lung: Secondary | ICD-10-CM

## 2016-12-19 DIAGNOSIS — G893 Neoplasm related pain (acute) (chronic): Secondary | ICD-10-CM

## 2016-12-19 LAB — PROTIME-INR

## 2016-12-19 MED ORDER — HYDROCODONE-ACETAMINOPHEN 5-325 MG PO TABS: 1.0000 | ORAL_TABLET | Freq: Four times a day (QID) | ORAL | 0 refills | Status: DC | PRN

## 2016-12-19 MED ORDER — LORAZEPAM 0.5 MG PO TABS: 0.5000 mg | ORAL_TABLET | Freq: Three times a day (TID) | ORAL | 0 refills | Status: AC | PRN

## 2016-12-19 NOTE — Telephone Encounter (Signed)
Called to report that patient is complaining of pain at her neck biopsy site rating at a 9/10, but is laughing and does not seem to be in excruciating pain. She reports it hurts from the scalp down her shoulder and then down the entire right side of her body. She seems to be confused and not remembering things. She seems depressed, had a crying spell with PT yesterday. The daughter Hinton Dyer has contacted Dr Nicki Reaper today asking about pain med and Hospice services. Please advise regarding pain med, confusion and hospice care

## 2016-12-19 NOTE — Telephone Encounter (Signed)
PT/INR ordered.

## 2016-12-19 NOTE — Telephone Encounter (Signed)
Nancey Kreitz, RN Spoke with daughter she will come to [facility] to pick up rx. Pt agreeable to hospice services. md wrote rx for rx for ativan 0.5 mg 1 tablet every 8 hrs prn anxiety/agitation. Also write rx for norco 5/325.  Hospice services contacted. Rn spoke with Olegario Shearer at [facility]-who will provide handoff to hospice services. Per Olegario Shearer, hospice services can be opened in am.  Daughter made aware of plan. She thanked me for helping her today.

## 2016-12-19 NOTE — Telephone Encounter (Signed)
Daughter Richmond Campbell called as a follow up call to request: Pain Medication Something for agitation Sleep medication Hospice referral. She states that they are now ready for hospice and would like to start the service ASAP.

## 2016-12-21 ENCOUNTER — Telehealth: Payer: Self-pay | Admitting: *Deleted

## 2016-12-21 ENCOUNTER — Telehealth: Payer: Self-pay | Admitting: Internal Medicine

## 2016-12-21 NOTE — Telephone Encounter (Signed)
pls advise

## 2016-12-21 NOTE — Telephone Encounter (Signed)
Spoke to [place].  Pt is doing better now.  Blood pressure is 130s/70s and pulse rate has decreased.  She is eating some.  No bleeding.  Some back pain, but no chest pain or sob.  Takes the hydrocodone for the back pain.  Did not take coumadin last night and will remain off for now until hears otherwise.  Hospice nurse will be out tomorrow.  Called Hospice and gave order for stat pt/inr to be drawn tomorrow.  They will call results to [place].

## 2016-12-21 NOTE — Telephone Encounter (Signed)
Inquiring about results drawn by home health and medication adjustments. Please advise.  She is now under Hospice care.  She has a call in to Dr Lars Mage office as well regarding this same matter

## 2016-12-21 NOTE — Telephone Encounter (Signed)
Advised that PCP is handling this an d working on it

## 2016-12-21 NOTE — Telephone Encounter (Signed)
Pt daughter Hinton Dyer called about pt needing guidance on blood thinner and any of her other medications. Mainly the blood thinner and her heart rate is 128 this morning. Medication is warfarin (COUMADIN) 5 MG tablet(Expired). Please advise if pt needs to stop this medication because her blood count has gone from 1.9 to over 5.   Call daughter @  [phone]. Thank you!

## 2016-12-21 NOTE — Telephone Encounter (Signed)
Labs found given to you for review.

## 2016-12-21 NOTE — Telephone Encounter (Signed)
Have not seen recent INR on pt. If INR is 5, yes she would need to hold the coumadin.  Need results asap.  Need to know what other symptoms she is having with the increased heart rate.  Is she eating?  Pain?  Any bleeding?

## 2016-12-22 LAB — PROTIME-INR

## 2016-12-22 MED ORDER — WARFARIN SODIUM 2 MG PO TABS: 2.0000 mg | ORAL_TABLET | Freq: Every day | ORAL | 3 refills | Status: AC

## 2016-12-22 NOTE — Telephone Encounter (Signed)
Patient last dose was 0.5 tablet which would be 2.5 mg daily. This is per patient daughter.

## 2016-12-22 NOTE — Telephone Encounter (Signed)
Patient daughter notified and voiced understanding. Called hospice and notified triage nurse Mariann Laster of new orders and she read back orders correctly.

## 2016-12-22 NOTE — Telephone Encounter (Signed)
Called Hospice was given triage line had to leave message, so left detailed message with patient name date of birth and reason for call with name and callback number.

## 2016-12-22 NOTE — Telephone Encounter (Signed)
Notify - INR improved.  Still slightly increased.  Remain off coumadin today and tomorrow and restart coumadin on Sunday.  Recommend starting '2mg'$  per day.  Will need to send in new rx.  Make sure to get rid of old coumadin so not to confuse dose.  Recheck pt/inr on Tuesday 12/26/16.

## 2016-12-22 NOTE — Telephone Encounter (Signed)
Hospice just received results INR = 3.2 and PT is 33 patient medication was held 12/21/16.

## 2016-12-22 NOTE — Telephone Encounter (Signed)
Need to know exact dose of coumadin she was taking prior to stopping.  Will need to adjust dose.  She has been in the hospital.  Not sure if changed.

## 2016-12-25 ENCOUNTER — Telehealth: Payer: Self-pay | Admitting: *Deleted

## 2016-12-25 ENCOUNTER — Telehealth: Payer: Self-pay | Admitting: Internal Medicine

## 2016-12-25 MED ORDER — FENTANYL 25 MCG/HR TD PT72: 25.0000 ug | MEDICATED_PATCH | TRANSDERMAL | 0 refills | Status: DC

## 2016-12-25 NOTE — Telephone Encounter (Signed)
Per Dr Rogue Bussing Fentanyl 25 mcg Rx faxed to [facility] informed

## 2016-12-25 NOTE — Telephone Encounter (Signed)
Seth Bake from [facility] called and stated that they need new draw orders and also stated that pt is declining medication. Please advise, thank you!  Call Seth Bake ! [phone].

## 2016-12-25 NOTE — Telephone Encounter (Signed)
Tried to call andrea.  Left message trying to reach.  The orders were given in phone message last week. See phone message.  Was to be redrawn Tuesday 12/26/16.  If family and pt refuse coumadin, ok to hold and can start aspirin daily.

## 2016-12-25 NOTE — Telephone Encounter (Signed)
Called to report that the nodules in her neck have increased in size since last week and that she is having increased pain from  Her left neck down the left side to her left hip. Hydrocodone is not lasting. Asking for a long acting medication to be ordered. Please advise.

## 2016-12-25 NOTE — Telephone Encounter (Signed)
Left detailed message advising of below on [name] [name]'s voicemail [phone] to check PT/INR on 12/27/15 and ordered last week and advising of below.   Also advised to confirm received voicemail.    I contact Hospice after hours as well letting them know we left message with [name] [name] and they will advise [name] [name].

## 2016-12-25 NOTE — Telephone Encounter (Signed)
Spoke with Seth Bake wanted to know when PT should be rechecked and will need order. She also wanted to check with you and let you know family wanted to know if she can just take ASA d/c coumadin.

## 2016-12-26 NOTE — Telephone Encounter (Signed)
Spoke with Seth Bake she will draw pt/INR today and recheck with family about ASA. She will let us know what they say.

## 2016-12-27 ENCOUNTER — Telehealth: Payer: Self-pay | Admitting: Internal Medicine

## 2016-12-27 NOTE — Telephone Encounter (Signed)
Spoke to [employer].  Family desires no coumadin.  Will d/c coumadin.  Pt declining.  Hospice involved.  They are working to keep her comfortable.  Will call me if needs anything more.

## 2016-12-28 ENCOUNTER — Telehealth: Payer: Self-pay | Admitting: *Deleted

## 2016-12-28 DIAGNOSIS — G893 Neoplasm related pain (acute) (chronic): Secondary | ICD-10-CM

## 2016-12-28 DIAGNOSIS — C342 Malignant neoplasm of middle lobe, bronchus or lung: Secondary | ICD-10-CM

## 2016-12-28 MED ORDER — MORPHINE SULFATE (CONCENTRATE) 10 MG /0.5 ML PO SOLN
ORAL | 0 refills | Status: DC
Start: 2016-12-28 — End: 2016-12-28

## 2016-12-28 MED ORDER — HYDROCODONE-ACETAMINOPHEN 5-325 MG PO TABS: 1.0000 | ORAL_TABLET | Freq: Four times a day (QID) | ORAL | 0 refills | Status: DC | PRN

## 2016-12-28 MED ORDER — HYDROCODONE-ACETAMINOPHEN 5-325 MG PO TABS: 1.0000 | ORAL_TABLET | ORAL | 0 refills | Status: DC | PRN

## 2016-12-28 MED ORDER — MORPHINE SULFATE (CONCENTRATE) 10 MG /0.5 ML PO SOLN: ORAL | 0 refills | Status: AC

## 2016-12-28 MED ORDER — HYDROCODONE-ACETAMINOPHEN 5-325 MG PO TABS: 1.0000 | ORAL_TABLET | ORAL | 0 refills | Status: AC | PRN

## 2016-12-28 MED ORDER — MORPHINE SULFATE (CONCENTRATE) 10 MG /0.5 ML PO SOLN: ORAL | 0 refills | Status: DC

## 2016-12-28 NOTE — Telephone Encounter (Signed)
Patient is now having difficulty swallowing, Daughter is not ready to completely switch to Roxanol, she is crushing the Norco and giving it in yogurt. She has not received the comfort kit yet and wants to have Roxanol on hand Asking for # 15 Norco to be given every 4 hours instead of every 6 hours and Roxanol

## 2017-01-01 ENCOUNTER — Telehealth: Payer: Self-pay | Admitting: *Deleted

## 2017-01-01 MED ORDER — FENTANYL 50 MCG/HR TD PT72: 50.0000 ug | MEDICATED_PATCH | TRANSDERMAL | 0 refills | Status: AC

## 2017-01-01 NOTE — Telephone Encounter (Signed)
She was having increased pain so the family put on a second Fentanyl 25 mcg patch and her pain is better controlled today, She will need a refill of patches and asking for 50 mcg patches. She is having difficulty getting up so they will be putting in a foley catheter today and also wanted you to know that she is vomiting up bile.

## 2017-01-01 NOTE — Telephone Encounter (Signed)
Rx faxed. Seth Bake notified

## 2017-01-04 ENCOUNTER — Telehealth: Payer: Self-pay | Admitting: *Deleted

## 2017-01-11 ENCOUNTER — Ambulatory Visit: Payer: Medicare Other | Admitting: Internal Medicine

## 2017-01-11 ENCOUNTER — Other Ambulatory Visit: Payer: Medicare Other

## 2017-01-11 NOTE — Telephone Encounter (Signed)
notification of patient's death on 2017/01/14 at hospice home

## 2017-01-11 NOTE — Telephone Encounter (Signed)
Time of death-426am at hospice home

## 2017-01-11 DEATH — deceased

## 2017-02-03 ENCOUNTER — Other Ambulatory Visit: Payer: Self-pay | Admitting: Cardiovascular Disease

## 2017-02-13 ENCOUNTER — Other Ambulatory Visit: Payer: Medicare Other

## 2017-02-16 ENCOUNTER — Ambulatory Visit: Payer: Medicare Other | Admitting: Internal Medicine

## 2018-09-14 IMAGING — DX DG KNEE COMPLETE 4+V*R*
5 series · 5 of 5 positions shown · non-contrast
Comparison: None.

CLINICAL DATA: Right knee giving out, recent fall

EXAM:
RIGHT KNEE - COMPLETE 4+ VIEW

[knee standing ap]
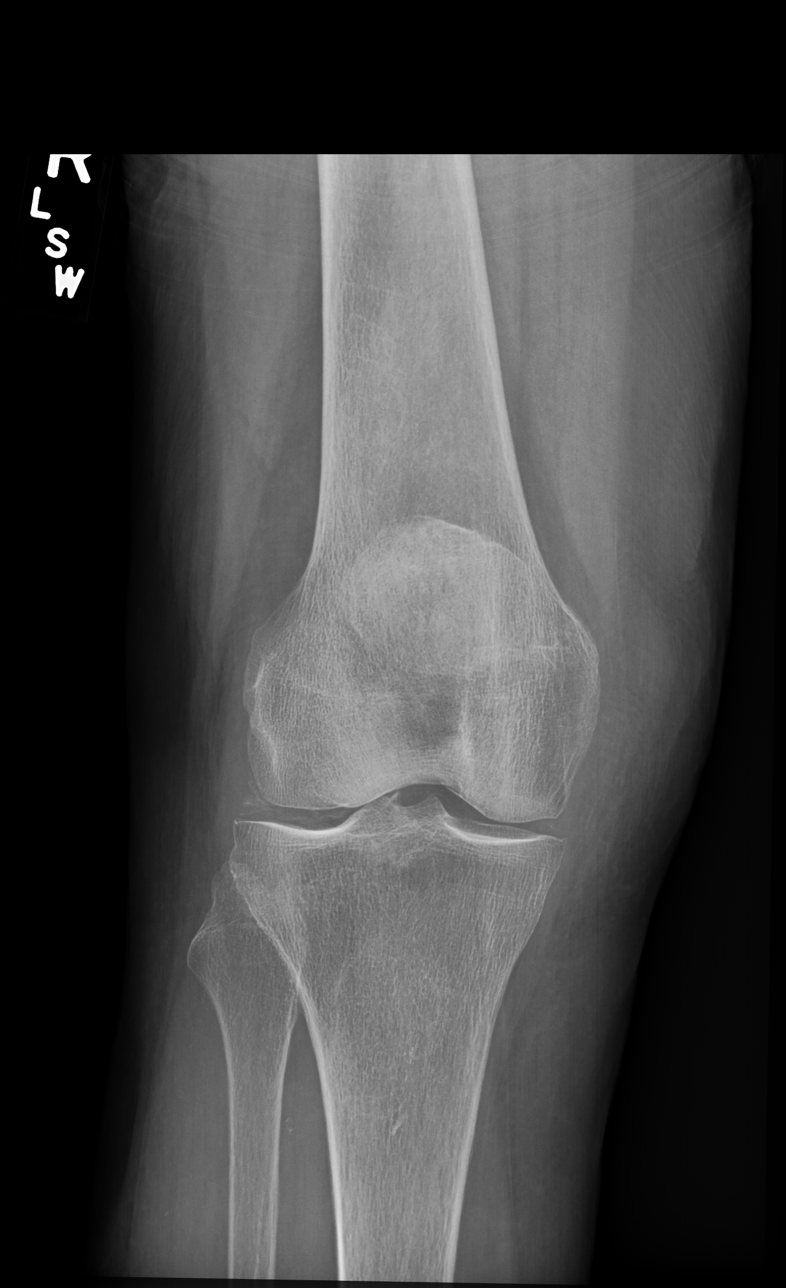

[knee standing external ap]
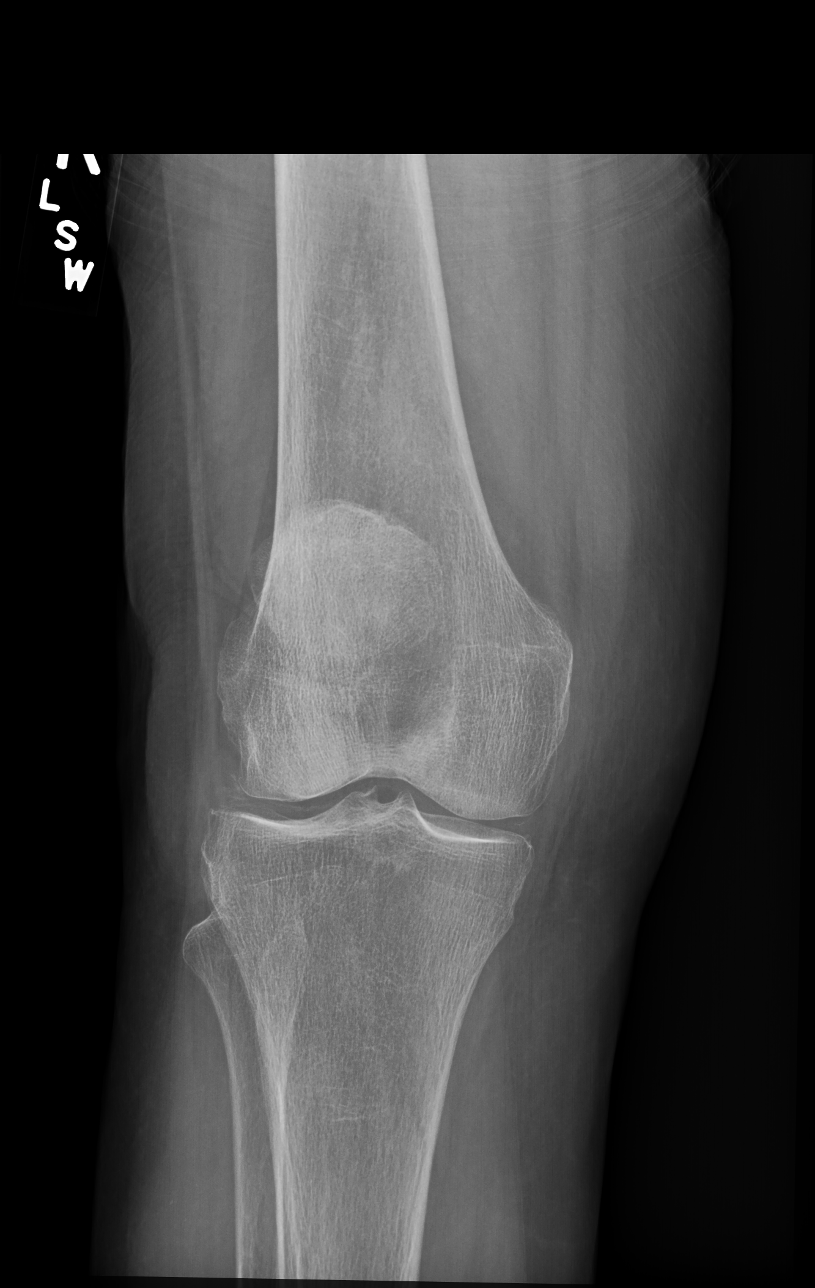

[knee standing internal ap]
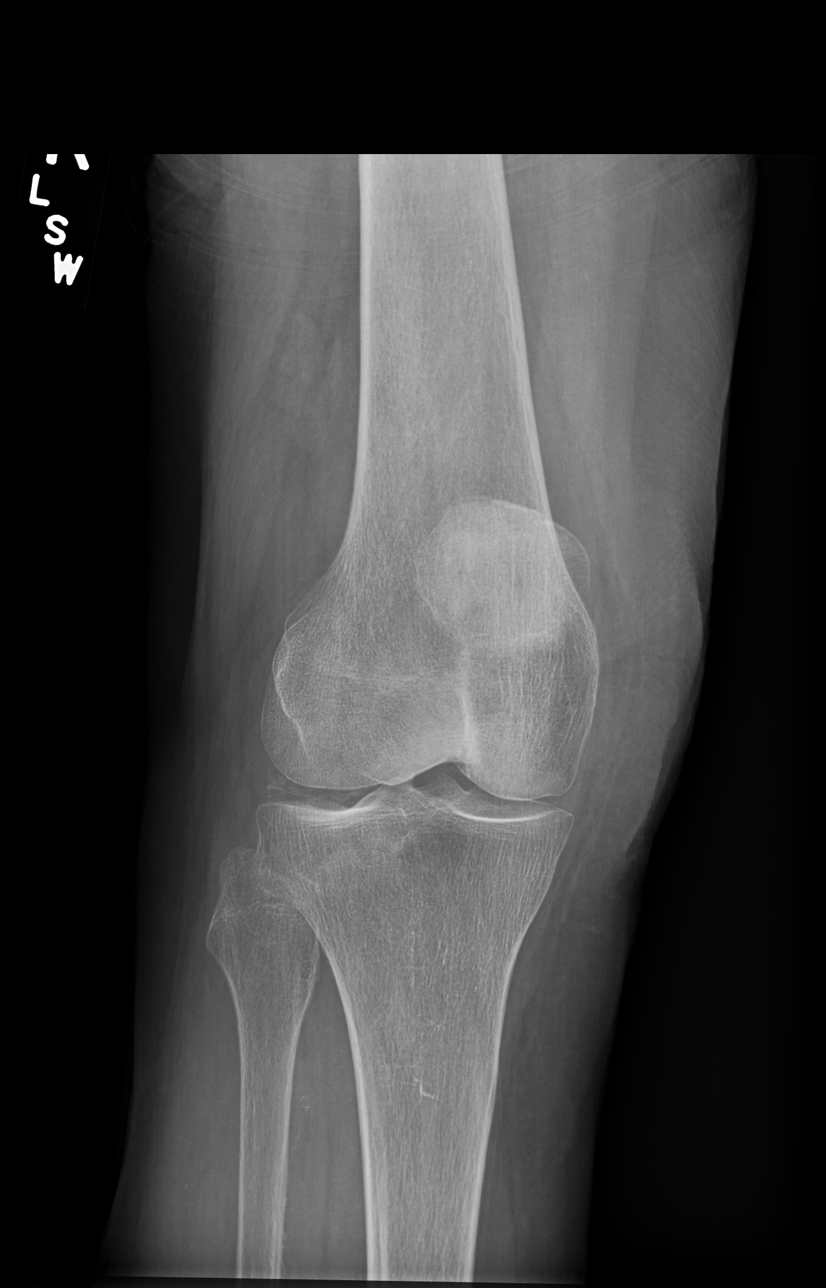

[knee standing lat]
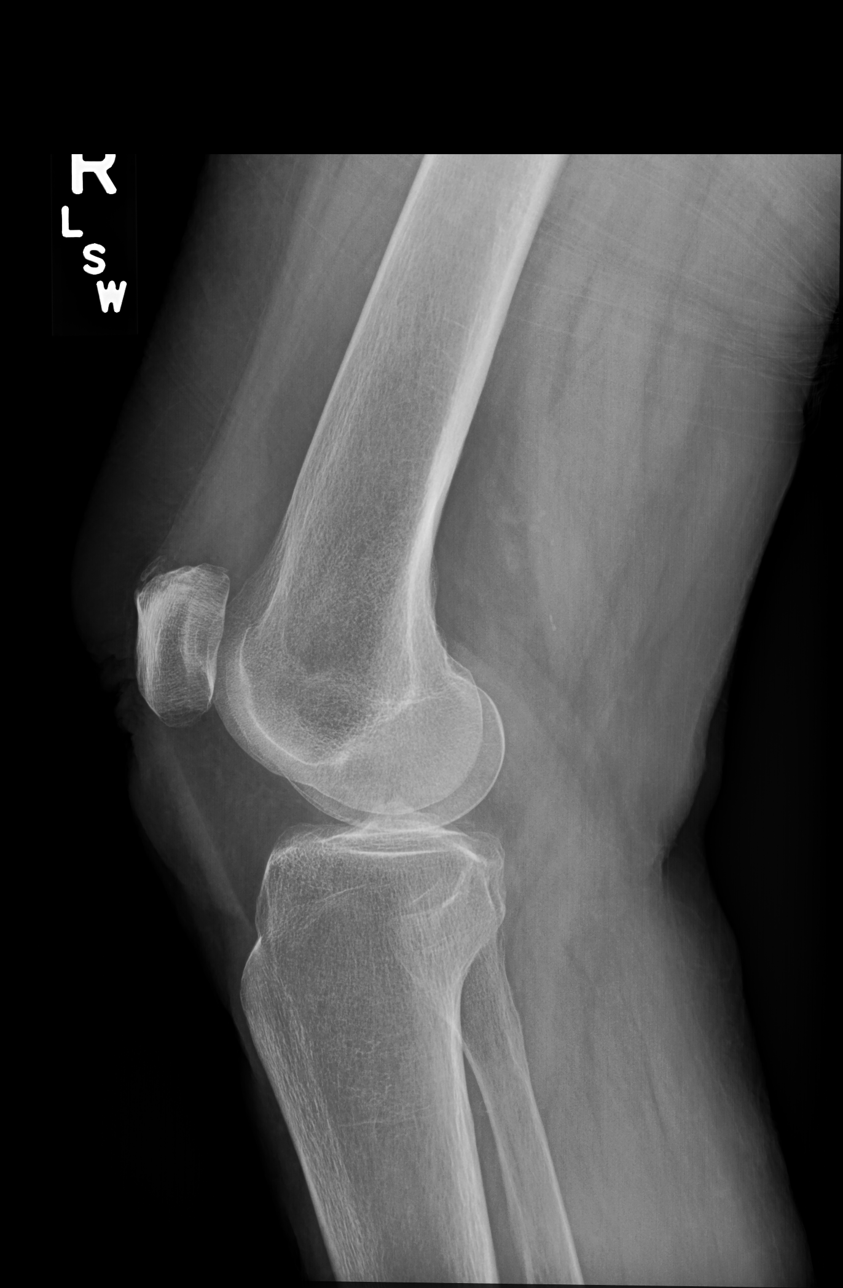

[knee [person_name] view pa]
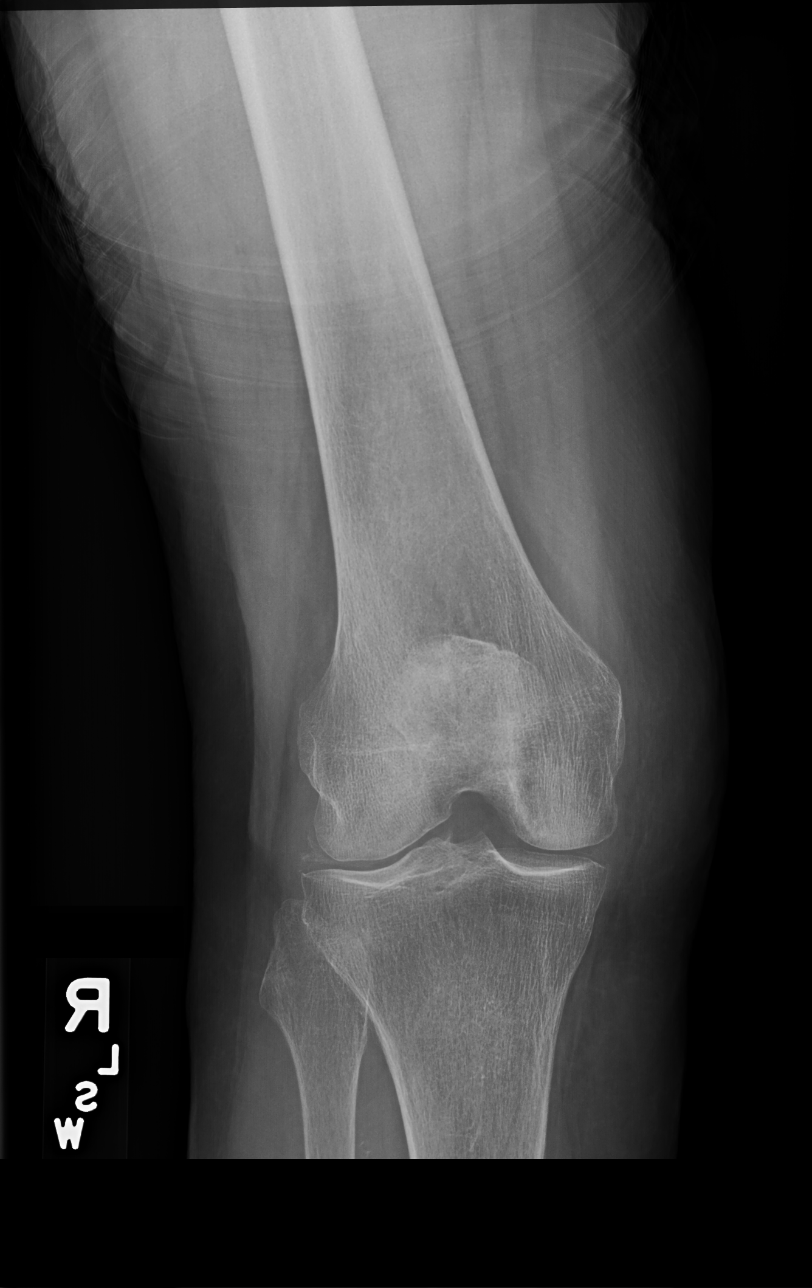

[5 of 5 positions shown; findings below may reference images not displayed]

FINDINGS: There is primarily bicompartmental degenerative joint disease ofthe
right knee involving the medial and lateral compartments where there
is more loss of joint space and sclerosis with spurring. The
patellofemoral joint space is relatively well preserved. No fracture
seen and no joint effusion is noted. There is evidence of
chondrocalcinosis which may indicate CPPD arthropathy.
IMPRESSION: 1. Primarily bicompartmental degenerative joint disease involving
the medial and lateral compartments. No acute abnormality.
2. Chondrocalcinosis.
